# Patient Record
Sex: Female | Born: 1945 | Race: White | Hispanic: No | Marital: Single | Smoking: Never smoker
Health system: Southern US, Community
[De-identification: ages and names within clinical notes are randomized; demographics above are authoritative.]

## PROBLEM LIST (undated history)

## (undated) DIAGNOSIS — R569 Unspecified convulsions: Secondary | ICD-10-CM

## (undated) DIAGNOSIS — K56609 Unspecified intestinal obstruction, unspecified as to partial versus complete obstruction: Secondary | ICD-10-CM

## (undated) DIAGNOSIS — E7800 Pure hypercholesterolemia, unspecified: Secondary | ICD-10-CM

## (undated) DIAGNOSIS — F79 Unspecified intellectual disabilities: Secondary | ICD-10-CM

## (undated) DIAGNOSIS — K6389 Other specified diseases of intestine: Secondary | ICD-10-CM

## (undated) DIAGNOSIS — R609 Edema, unspecified: Secondary | ICD-10-CM

## (undated) HISTORY — PX: CHOLECYSTECTOMY: SHX55

---

## 1994-06-19 ENCOUNTER — Encounter: Payer: Self-pay | Admitting: Internal Medicine

## 1998-04-27 ENCOUNTER — Ambulatory Visit (HOSPITAL_COMMUNITY): Admission: RE | Admit: 1998-04-27 | Discharge: 1998-04-27 | Payer: Self-pay | Admitting: Internal Medicine

## 2000-01-04 ENCOUNTER — Other Ambulatory Visit: Admission: RE | Admit: 2000-01-04 | Discharge: 2000-01-04 | Payer: Self-pay | Admitting: *Deleted

## 2000-01-16 ENCOUNTER — Encounter: Payer: Self-pay | Admitting: *Deleted

## 2000-01-16 ENCOUNTER — Encounter: Admission: RE | Admit: 2000-01-16 | Discharge: 2000-01-16 | Payer: Self-pay | Admitting: *Deleted

## 2000-01-26 ENCOUNTER — Encounter: Payer: Self-pay | Admitting: *Deleted

## 2000-01-26 ENCOUNTER — Encounter: Admission: RE | Admit: 2000-01-26 | Discharge: 2000-01-26 | Payer: Self-pay | Admitting: *Deleted

## 2001-02-21 ENCOUNTER — Encounter: Admission: RE | Admit: 2001-02-21 | Discharge: 2001-02-21 | Payer: Self-pay | Admitting: Obstetrics and Gynecology

## 2001-02-21 ENCOUNTER — Encounter: Payer: Self-pay | Admitting: Obstetrics and Gynecology

## 2001-02-21 ENCOUNTER — Other Ambulatory Visit: Admission: RE | Admit: 2001-02-21 | Discharge: 2001-02-21 | Payer: Self-pay | Admitting: Obstetrics and Gynecology

## 2002-02-05 ENCOUNTER — Other Ambulatory Visit: Admission: RE | Admit: 2002-02-05 | Discharge: 2002-02-05 | Payer: Self-pay | Admitting: Obstetrics and Gynecology

## 2004-10-07 ENCOUNTER — Ambulatory Visit: Payer: Self-pay | Admitting: Family Medicine

## 2004-10-07 ENCOUNTER — Other Ambulatory Visit: Admission: RE | Admit: 2004-10-07 | Discharge: 2004-10-07 | Payer: Self-pay | Admitting: Family Medicine

## 2005-04-19 ENCOUNTER — Ambulatory Visit: Payer: Self-pay | Admitting: Family Medicine

## 2006-09-24 ENCOUNTER — Encounter (INDEPENDENT_AMBULATORY_CARE_PROVIDER_SITE_OTHER): Payer: Self-pay | Admitting: *Deleted

## 2006-10-03 ENCOUNTER — Ambulatory Visit: Payer: Self-pay | Admitting: Family Medicine

## 2006-10-16 ENCOUNTER — Encounter: Admission: RE | Admit: 2006-10-16 | Discharge: 2006-10-16 | Payer: Self-pay | Admitting: Specialist

## 2007-02-21 DIAGNOSIS — N393 Stress incontinence (female) (male): Secondary | ICD-10-CM | POA: Insufficient documentation

## 2007-02-21 DIAGNOSIS — R569 Unspecified convulsions: Secondary | ICD-10-CM

## 2007-02-21 DIAGNOSIS — F79 Unspecified intellectual disabilities: Secondary | ICD-10-CM | POA: Insufficient documentation

## 2007-02-22 ENCOUNTER — Encounter (INDEPENDENT_AMBULATORY_CARE_PROVIDER_SITE_OTHER): Payer: Self-pay | Admitting: *Deleted

## 2007-06-15 ENCOUNTER — Emergency Department (HOSPITAL_COMMUNITY): Admission: EM | Admit: 2007-06-15 | Discharge: 2007-06-15 | Payer: Self-pay | Admitting: Emergency Medicine

## 2007-10-31 ENCOUNTER — Encounter: Admission: RE | Admit: 2007-10-31 | Discharge: 2007-10-31 | Payer: Self-pay | Admitting: Specialist

## 2008-09-10 ENCOUNTER — Emergency Department (HOSPITAL_COMMUNITY): Admission: EM | Admit: 2008-09-10 | Discharge: 2008-09-10 | Payer: Self-pay | Admitting: Emergency Medicine

## 2008-11-17 ENCOUNTER — Encounter: Admission: RE | Admit: 2008-11-17 | Discharge: 2008-11-17 | Payer: Self-pay | Admitting: Specialist

## 2009-05-09 ENCOUNTER — Encounter: Payer: Self-pay | Admitting: Internal Medicine

## 2010-07-26 ENCOUNTER — Ambulatory Visit (HOSPITAL_BASED_OUTPATIENT_CLINIC_OR_DEPARTMENT_OTHER): Admission: RE | Admit: 2010-07-26 | Discharge: 2010-07-26 | Payer: Self-pay | Admitting: Specialist

## 2010-07-30 ENCOUNTER — Ambulatory Visit: Payer: Self-pay | Admitting: Internal Medicine

## 2010-10-31 ENCOUNTER — Ambulatory Visit (HOSPITAL_BASED_OUTPATIENT_CLINIC_OR_DEPARTMENT_OTHER)
Admission: RE | Admit: 2010-10-31 | Discharge: 2010-10-31 | Payer: Self-pay | Source: Home / Self Care | Admitting: Specialist

## 2010-11-05 ENCOUNTER — Ambulatory Visit: Payer: Self-pay | Admitting: Internal Medicine

## 2011-01-14 ENCOUNTER — Encounter: Payer: Self-pay | Admitting: Specialist

## 2011-07-19 ENCOUNTER — Emergency Department (HOSPITAL_BASED_OUTPATIENT_CLINIC_OR_DEPARTMENT_OTHER)
Admission: EM | Admit: 2011-07-19 | Discharge: 2011-07-19 | Disposition: A | Discharge status: Home / Self Care | Payer: Medicare Other | Attending: Emergency Medicine | Admitting: Emergency Medicine

## 2011-07-19 DIAGNOSIS — R569 Unspecified convulsions: Secondary | ICD-10-CM | POA: Insufficient documentation

## 2011-07-19 DIAGNOSIS — S61219A Laceration without foreign body of unspecified finger without damage to nail, initial encounter: Secondary | ICD-10-CM

## 2011-07-19 DIAGNOSIS — W268XXA Contact with other sharp object(s), not elsewhere classified, initial encounter: Secondary | ICD-10-CM | POA: Insufficient documentation

## 2011-07-19 DIAGNOSIS — M810 Age-related osteoporosis without current pathological fracture: Secondary | ICD-10-CM | POA: Insufficient documentation

## 2011-07-19 DIAGNOSIS — S61209A Unspecified open wound of unspecified finger without damage to nail, initial encounter: Secondary | ICD-10-CM | POA: Insufficient documentation

## 2011-07-19 DIAGNOSIS — E7800 Pure hypercholesterolemia, unspecified: Secondary | ICD-10-CM | POA: Insufficient documentation

## 2011-07-19 HISTORY — DX: Edema, unspecified: R60.9

## 2011-07-19 HISTORY — DX: Unspecified convulsions: R56.9

## 2011-07-19 HISTORY — DX: Pure hypercholesterolemia, unspecified: E78.00

## 2011-07-19 MED ORDER — TETANUS-DIPHTH-ACELL PERTUSSIS 5-2.5-18.5 LF-MCG/0.5 IM SUSP
INTRAMUSCULAR | Status: AC
  Filled 2011-07-19: qty 0.5

## 2011-07-19 MED ORDER — TETANUS-DIPHTH-ACELL PERTUSSIS 5-2.5-18.5 LF-MCG/0.5 IM SUSP
0.5000 mL | Freq: Once | INTRAMUSCULAR | Status: AC
  Administered 2011-07-19: 0.5 mL via INTRAMUSCULAR

## 2011-07-19 NOTE — ED Notes (Signed)
Right index finger laceration while opening a mason jar lid last night

## 2011-07-19 NOTE — ED Provider Notes (Signed)
History     Chief Complaint  Patient presents with  . Laceration   HPI Comments: Pt states that she was opening a mason jar and cut herself on the metal  Patient is a 65 y.o. female presenting with skin laceration. The history is provided by a caregiver and the patient. History Limited By: pt is mr and gave a short history,primary history provided by caregiver. No language interpreter was used.  Laceration  The incident occurred less than 1 hour ago. The laceration is located on the right hand. The laceration is 1 cm in size. The laceration mechanism was a a metal edge. The patient is experiencing no pain. She reports no foreign bodies present. Her tetanus status is out of date.    Past Medical History  Diagnosis Date  . Hypercholesteremia   . Constipation   . Menopause   . Osteoporosis   . Seizure   . Edema     History reviewed. No pertinent past surgical history.  History reviewed. No pertinent family history.  History  Substance Use Topics  . Smoking status: Never Smoker   . Smokeless tobacco: Not on file  . Alcohol Use: No    OB History    Grav Para Term Preterm Abortions TAB SAB Ect Mult Living                  Review of Systems  Constitutional: Negative.   Respiratory: Negative.   Cardiovascular: Negative.   Skin:       Pt has a superficial laceration to the right index finger  Neurological: Negative.     Physical Exam  BP 143/68  Pulse 75  Temp(Src) 98.1 F (36.7 C) (Oral)  Resp 20  SpO2 100%  Physical Exam  Nursing note and vitals reviewed. Constitutional: She appears well-developed and well-nourished.  Cardiovascular: Normal rate.   Pulmonary/Chest: Effort normal.  Musculoskeletal: Normal range of motion.  Skin: Laceration noted.       ED Course  Procedures  MDM No concern for fb, dermabond used to close the area,tetanus updated     Teressa Lower, NP 07/19/11 1235

## 2011-07-19 NOTE — ED Provider Notes (Signed)
Medical screening examination/treatment/procedure(s) were performed by non-physician practitioner and as supervising physician I was immediately available for consultation/collaboration.   Celene Kras, MD 07/19/11 [phone]

## 2011-07-19 NOTE — ED Notes (Signed)
Right index finger soaked in normal saline/betadine

## 2011-09-25 LAB — COMPREHENSIVE METABOLIC PANEL
ALT: 17
Alkaline Phosphatase: 80
BUN: 18
CO2: 29
Calcium: 9.2
GFR calc non Af Amer: >60
Glucose, Bld: 124 — ABNORMAL HIGH
Potassium: 4.9
Sodium: 141

## 2011-09-25 LAB — LIPASE, BLOOD: Lipase: 23

## 2011-09-25 LAB — CBC
HCT: 36.9
Hemoglobin: 12.4
MCHC: 33.5
RBC: 3.98

## 2011-09-25 LAB — URINE MICROSCOPIC-ADD ON

## 2011-09-25 LAB — URINALYSIS, ROUTINE W REFLEX MICROSCOPIC
Bilirubin Urine: NEGATIVE
Glucose, UA: NEGATIVE
Hgb urine dipstick: NEGATIVE
Ketones, ur: NEGATIVE
Protein, ur: NEGATIVE

## 2011-09-25 LAB — POCT CARDIAC MARKERS: Myoglobin, poc: 83

## 2011-09-25 LAB — DIFFERENTIAL
Basophils Absolute: 0.1
Basophils Relative: 1
Eosinophils Absolute: 0
Neutro Abs: 9.1 — ABNORMAL HIGH
Neutrophils Relative %: 81 — ABNORMAL HIGH

## 2011-11-29 ENCOUNTER — Emergency Department (HOSPITAL_BASED_OUTPATIENT_CLINIC_OR_DEPARTMENT_OTHER)
Admission: EM | Admit: 2011-11-29 | Discharge: 2011-11-29 | Disposition: A | Discharge status: Home / Self Care | Payer: Medicare Other | Attending: Emergency Medicine | Admitting: Emergency Medicine

## 2011-11-29 ENCOUNTER — Encounter (HOSPITAL_BASED_OUTPATIENT_CLINIC_OR_DEPARTMENT_OTHER): Payer: Self-pay | Admitting: Emergency Medicine

## 2011-11-29 DIAGNOSIS — W1809XA Striking against other object with subsequent fall, initial encounter: Secondary | ICD-10-CM | POA: Insufficient documentation

## 2011-11-29 DIAGNOSIS — Z0389 Encounter for observation for other suspected diseases and conditions ruled out: Secondary | ICD-10-CM | POA: Insufficient documentation

## 2011-11-29 DIAGNOSIS — M810 Age-related osteoporosis without current pathological fracture: Secondary | ICD-10-CM | POA: Insufficient documentation

## 2011-11-29 DIAGNOSIS — Z79899 Other long term (current) drug therapy: Secondary | ICD-10-CM | POA: Insufficient documentation

## 2011-11-29 DIAGNOSIS — W1800XA Striking against unspecified object with subsequent fall, initial encounter: Secondary | ICD-10-CM

## 2011-11-29 DIAGNOSIS — E7800 Pure hypercholesterolemia, unspecified: Secondary | ICD-10-CM | POA: Insufficient documentation

## 2011-11-29 DIAGNOSIS — Y9210 Unspecified residential institution as the place of occurrence of the external cause: Secondary | ICD-10-CM | POA: Insufficient documentation

## 2011-11-29 NOTE — ED Notes (Signed)
No distress noted in Pt. °

## 2011-11-29 NOTE — ED Provider Notes (Signed)
History     CSN: [id number] Arrival date & time: 11/29/2011  1:17 PM   None     Chief Complaint  Patient presents with  . Fall    hit front tooth on a chair    (Consider location/radiation/quality/duration/timing/severity/associated sxs/prior treatment) HPI  65 year old woman with history of seizures, but no seizure in over 8 years per group home staff, who lives in a group home comes in for a fall.  Most of the history is obtained from the [facility] group home staff member present.   Past Medical History  Diagnosis Date  . Hypercholesteremia   . Constipation   . Menopause   . Osteoporosis   . Seizure   . Edema     No past surgical history on file.  No family history on file.  History  Substance Use Topics  . Smoking status: Never Smoker   . Smokeless tobacco: Not on file  . Alcohol Use: No     Review of Systems No CP, SOB, nausea, vomiting, diarrhea, constipation, or leg pain.  See HPI for rest.  Allergies  Review of patient's allergies indicates no known allergies.  Home Medications   Current Outpatient Rx  Name Route Sig Dispense Refill  . ATORVASTATIN CALCIUM 10 MG PO TABS Oral Take 10 mg by mouth daily.      Marland Kitchen BISACODYL 5 MG PO TBEC Oral Take 5 mg by mouth daily as needed.      Marland Kitchen CALCIUM CARBONATE-VITAMIN D 500-200 MG-UNIT PO TABS Oral Take 1 tablet by mouth 3 (three) times daily.      . CHLORHEXIDINE GLUCONATE 0.12 % MT SOLN Mouth/Throat Use as directed 15 mLs in the mouth or throat 2 (two) times daily.      . FELBAMATE 600 MG PO TABS Oral Take 600 mg by mouth 3 (three) times daily.      . FENOFIBRATE 145 MG PO TABS Oral Take 145 mg by mouth daily.      . FUROSEMIDE 20 MG PO TABS Oral Take 20 mg by mouth daily.      Marland Kitchen LACTULOSE 10 G PO PACK Oral Take 10 g by mouth 2 (two) times daily.      Marland Kitchen LORAZEPAM 1 MG PO TABS Oral Take 1 mg by mouth every 8 (eight) hours.      . OXYBUTYNIN CHLORIDE 5 MG PO TABS Oral Take 5 mg by mouth 3 (three) times daily.        Marland Kitchen POTASSIUM CHLORIDE CRYS CR 20 MEQ PO TBCR Oral Take 20 mEq by mouth daily.        BP 151/85  Pulse 88  Temp(Src) 98.6 F (37 C) (Oral)  Resp 17  Ht 5\' 3"  (1.6 m)  Wt 211 lb (95.709 kg)  BMI 37.38 kg/m2  SpO2 95%  Physical Exam General: alert, well-developed, and cooperative to examination.  Head: normocephalic and atraumatic.   Mouth: Small scantly bleeding cut on external L lower lip.  ~72mm cut on inside lower lip not draining blood.  Top L front tooth is loose and painful, no bleeding.pharynx pink and moist, no erythema, and no exudates. No significant swelling.  Lungs: normal respiratory effort, no accessory muscle use, normal breath sounds, no crackles, and no wheezes. Heart: normal rate, regular rhythm, no murmur, no gallop, and no rub.  Extremities: No cyanosis, clubbing.  No warmth or pain to palpation in legs. Neurologic: alert & oriented X3, cranial nerves II-XII intact, strength 5/5 and symmetric in arms,  4+/5 and symmetric in legs (per group home staff member patient does not ambulate much).   ED Course  Procedures (including critical care time)    Diagnosis: Mechanical fall against object   MDM  I reviewed all aspects of this case with my attending, who personally interviewed and examined this patient.  Patient can be discharged back to her group home with the following plan:  Soft diet for three days Cold compresses 3x per day for two days Tylenol or ibuprofen OTC for pain control If tooth is still painful/loose after three days, group home to call patient's dentist for an appt.        Blanca Friend, MD 11/29/11 [phone]

## 2011-11-29 NOTE — ED Notes (Signed)
Pt. Fell into a chair this day causing injury to the Lower L lip with bleeding controlled and to the L front top tooth.  Some controlled bleeding noted to the Front Left top tooth.  Pt. Reports the pain is in the Lip and it "just hurts".

## 2011-12-14 ENCOUNTER — Encounter (HOSPITAL_BASED_OUTPATIENT_CLINIC_OR_DEPARTMENT_OTHER): Payer: Self-pay | Admitting: *Deleted

## 2011-12-14 DIAGNOSIS — M810 Age-related osteoporosis without current pathological fracture: Secondary | ICD-10-CM | POA: Insufficient documentation

## 2011-12-14 DIAGNOSIS — E7800 Pure hypercholesterolemia, unspecified: Secondary | ICD-10-CM | POA: Insufficient documentation

## 2011-12-14 DIAGNOSIS — Z79899 Other long term (current) drug therapy: Secondary | ICD-10-CM | POA: Insufficient documentation

## 2011-12-14 DIAGNOSIS — Z043 Encounter for examination and observation following other accident: Secondary | ICD-10-CM | POA: Insufficient documentation
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
## 2011-12-15 ENCOUNTER — Emergency Department (HOSPITAL_BASED_OUTPATIENT_CLINIC_OR_DEPARTMENT_OTHER)
Admission: EM | Admit: 2011-12-15 | Discharge: 2011-12-15 | Disposition: A | Discharge status: Home / Self Care | Payer: Medicare Other | Attending: Emergency Medicine | Admitting: Emergency Medicine

## 2011-12-15 NOTE — ED Notes (Signed)
See triage note.
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
## 2012-04-12 ENCOUNTER — Other Ambulatory Visit: Payer: Self-pay | Admitting: Specialist

## 2012-04-12 DIAGNOSIS — Z1231 Encounter for screening mammogram for malignant neoplasm of breast: Secondary | ICD-10-CM

## 2012-05-03 ENCOUNTER — Ambulatory Visit: Payer: Medicare Other

## 2013-12-10 ENCOUNTER — Emergency Department (HOSPITAL_COMMUNITY): Payer: Medicare Other

## 2013-12-10 ENCOUNTER — Inpatient Hospital Stay (HOSPITAL_COMMUNITY): Payer: Medicare Other

## 2013-12-10 ENCOUNTER — Inpatient Hospital Stay (HOSPITAL_COMMUNITY)
Admission: EM | Admit: 2013-12-10 | Discharge: 2013-12-15 | DRG: 389 | Disposition: A | Discharge status: Home / Self Care | Payer: Medicare Other | Attending: General Surgery | Admitting: General Surgery

## 2013-12-10 ENCOUNTER — Encounter (HOSPITAL_COMMUNITY): Payer: Self-pay | Admitting: Emergency Medicine

## 2013-12-10 DIAGNOSIS — J9819 Other pulmonary collapse: Secondary | ICD-10-CM | POA: Diagnosis present

## 2013-12-10 DIAGNOSIS — Z79899 Other long term (current) drug therapy: Secondary | ICD-10-CM

## 2013-12-10 DIAGNOSIS — Z781 Physical restraint status: Secondary | ICD-10-CM | POA: Diagnosis present

## 2013-12-10 DIAGNOSIS — E785 Hyperlipidemia, unspecified: Secondary | ICD-10-CM | POA: Diagnosis present

## 2013-12-10 DIAGNOSIS — R569 Unspecified convulsions: Secondary | ICD-10-CM | POA: Diagnosis present

## 2013-12-10 DIAGNOSIS — K56609 Unspecified intestinal obstruction, unspecified as to partial versus complete obstruction: Secondary | ICD-10-CM

## 2013-12-10 DIAGNOSIS — R7309 Other abnormal glucose: Secondary | ICD-10-CM | POA: Diagnosis present

## 2013-12-10 DIAGNOSIS — E7800 Pure hypercholesterolemia, unspecified: Secondary | ICD-10-CM | POA: Diagnosis present

## 2013-12-10 DIAGNOSIS — G40909 Epilepsy, unspecified, not intractable, without status epilepticus: Secondary | ICD-10-CM | POA: Diagnosis present

## 2013-12-10 DIAGNOSIS — F79 Unspecified intellectual disabilities: Secondary | ICD-10-CM | POA: Diagnosis present

## 2013-12-10 DIAGNOSIS — D72829 Elevated white blood cell count, unspecified: Secondary | ICD-10-CM | POA: Diagnosis present

## 2013-12-10 DIAGNOSIS — R030 Elevated blood-pressure reading, without diagnosis of hypertension: Secondary | ICD-10-CM | POA: Diagnosis present

## 2013-12-10 DIAGNOSIS — R739 Hyperglycemia, unspecified: Secondary | ICD-10-CM

## 2013-12-10 DIAGNOSIS — G4733 Obstructive sleep apnea (adult) (pediatric): Secondary | ICD-10-CM | POA: Diagnosis present

## 2013-12-10 DIAGNOSIS — M810 Age-related osteoporosis without current pathological fracture: Secondary | ICD-10-CM | POA: Diagnosis present

## 2013-12-10 LAB — CBC WITH DIFFERENTIAL/PLATELET
Eosinophils Relative: 0 % (ref 0–5)
HCT: 40.1 % (ref 36.0–46.0)
Hemoglobin: 13.8 g/dL (ref 12.0–15.0)
Lymphocytes Relative: 11 % — ABNORMAL LOW (ref 12–46)
Lymphs Abs: 1.2 10*3/uL (ref 0.7–4.0)
MCV: 90.9 fL (ref 78.0–100.0)
Platelets: 315 10*3/uL (ref 150–400)
RBC: 4.41 MIL/uL (ref 3.87–5.11)
WBC: 11.1 10*3/uL — ABNORMAL HIGH (ref 4.0–10.5)

## 2013-12-10 LAB — URINALYSIS, ROUTINE W REFLEX MICROSCOPIC
Hgb urine dipstick: NEGATIVE
Ketones, ur: NEGATIVE mg/dL
Protein, ur: NEGATIVE mg/dL
Urobilinogen, UA: 1 mg/dL (ref 0.0–1.0)

## 2013-12-10 LAB — COMPREHENSIVE METABOLIC PANEL
ALT: 15 U/L (ref 0–35)
Alkaline Phosphatase: 52 U/L (ref 39–117)
CO2: 24 mEq/L (ref 19–32)
Calcium: 9.4 mg/dL (ref 8.4–10.5)
GFR calc Af Amer: >90 mL/min (ref >90)
GFR calc non Af Amer: 88 mL/min — ABNORMAL LOW (ref >90)
Glucose, Bld: 169 mg/dL — ABNORMAL HIGH (ref 70–99)
Sodium: 139 mEq/L (ref 135–145)

## 2013-12-10 LAB — URINE MICROSCOPIC-ADD ON

## 2013-12-10 LAB — CG4 I-STAT (LACTIC ACID): Lactic Acid, Venous: 1.11 mmol/L (ref 0.5–2.2)

## 2013-12-10 MED ORDER — ONDANSETRON HCL 4 MG/2ML IJ SOLN
4.0000 mg | Freq: Once | INTRAMUSCULAR | Status: AC
  Administered 2013-12-10: 4 mg via INTRAVENOUS
  Filled 2013-12-10: qty 2

## 2013-12-10 MED ORDER — IOHEXOL 300 MG/ML  SOLN
100.0000 mL | Freq: Once | INTRAMUSCULAR | Status: AC | PRN
  Administered 2013-12-10: 100 mL via INTRAVENOUS

## 2013-12-10 MED ORDER — MORPHINE SULFATE 4 MG/ML IJ SOLN
4.0000 mg | Freq: Once | INTRAMUSCULAR | Status: AC
  Administered 2013-12-10: 4 mg via INTRAVENOUS
  Filled 2013-12-10: qty 1

## 2013-12-10 MED ORDER — SODIUM CHLORIDE 0.9 % IV BOLUS (SEPSIS)
1000.0000 mL | Freq: Once | INTRAVENOUS | Status: AC
  Administered 2013-12-10: 1000 mL via INTRAVENOUS

## 2013-12-10 MED ORDER — IOHEXOL 300 MG/ML  SOLN
50.0000 mL | Freq: Once | INTRAMUSCULAR | Status: AC | PRN
  Administered 2013-12-10: 50 mL via ORAL

## 2013-12-10 NOTE — ED Provider Notes (Signed)
CSN: [id number]     Arrival date & time 12/10/13  1755 History   First MD Initiated Contact with Patient 12/10/13 1815     Chief Complaint  Patient presents with  . Nausea  . Emesis  . Abdominal Pain   (Consider location/radiation/quality/duration/timing/severity/associated sxs/prior Treatment) HPI Comments: 67 year old female presents from her group home with 2 days of vomiting and nausea. She's also been having some epigastric and left quadrant abdominal pain. There's been no diarrhea. She states she had a normal bowel movement this morning. No fevers or chills. No urinary symptoms. Most of the history taken from the caregiver. She states that the patient is unable to take her pills at the group home until her concern about a bowel obstruction and sent her to the ER. Patient has never had any known abdominal surgeries.   Past Medical History  Diagnosis Date  . Hypercholesteremia   . Constipation   . Menopause   . Osteoporosis   . Seizure   . Edema    No past surgical history on file. No family history on file. History  Substance Use Topics  . Smoking status: Never Smoker   . Smokeless tobacco: Not on file  . Alcohol Use: No   OB History   Grav Para Term Preterm Abortions TAB SAB Ect Mult Living                 Review of Systems  Constitutional: Negative for fever and chills.  Gastrointestinal: Positive for nausea, vomiting and abdominal pain. Negative for diarrhea and constipation.  Genitourinary: Negative for dysuria.  All other systems reviewed and are negative.    Allergies  Review of patient's allergies indicates no known allergies.  Home Medications   Current Outpatient Rx  Name  Route  Sig  Dispense  Refill  . atorvastatin (LIPITOR) 10 MG tablet   Oral   Take 10 mg by mouth daily.           . bisacodyl (DULCOLAX) 5 MG EC tablet   Oral   Take 5 mg by mouth daily as needed.           . calcium-vitamin D (OSCAL WITH D) 500-200 MG-UNIT per tablet    Oral   Take 1 tablet by mouth 3 (three) times daily.           . chlorhexidine (PERIDEX) 0.12 % solution   Mouth/Throat   Use as directed 15 mLs in the mouth or throat 2 (two) times daily.           . felbamate (FELBATOL) 600 MG tablet   Oral   Take 600 mg by mouth 3 (three) times daily.           . fenofibrate (TRICOR) 145 MG tablet   Oral   Take 145 mg by mouth daily.           . furosemide (LASIX) 20 MG tablet   Oral   Take 20 mg by mouth daily.           Marland Kitchen lactulose (CEPHULAC) 10 G packet   Oral   Take 10 g by mouth 2 (two) times daily.           Marland Kitchen LORazepam (ATIVAN) 1 MG tablet   Oral   Take 1 mg by mouth every 8 (eight) hours.           Marland Kitchen oxybutynin (DITROPAN) 5 MG tablet   Oral   Take 5 mg by mouth 3 (  three) times daily.           . potassium chloride SA (K-DUR,KLOR-CON) 20 MEQ tablet   Oral   Take 20 mEq by mouth daily.           . urea (X-VIATE) 40 % CREA   Topical   Apply 1 application topically 2 (two) times daily.            BP 139/83  Pulse 129  Temp(Src) 99.7 F (37.6 C) (Oral)  Resp 16  SpO2 100% Physical Exam  Nursing note and vitals reviewed. Constitutional: She is oriented to person, place, and time. She appears well-developed and well-nourished.  HENT:  Head: Normocephalic and atraumatic.  Right Ear: External ear normal.  Left Ear: External ear normal.  Nose: Nose normal.  Eyes: Right eye exhibits no discharge. Left eye exhibits no discharge.  Cardiovascular: Normal rate, regular rhythm and normal heart sounds.   Pulmonary/Chest: Effort normal and breath sounds normal.  Abdominal: Soft. She exhibits no distension. There is tenderness in the epigastric area, periumbilical area and left upper quadrant.  Neurological: She is alert and oriented to person, place, and time.  Skin: Skin is warm and dry.    ED Course  Procedures (including critical care time) Labs Review Labs Reviewed  CBC WITH DIFFERENTIAL - Abnormal;  Notable for the following:    WBC 11.1 (*)    Neutrophils Relative % 83 (*)    Neutro Abs 9.2 (*)    Lymphocytes Relative 11 (*)    All other components within normal limits  COMPREHENSIVE METABOLIC PANEL - Abnormal; Notable for the following:    Glucose, Bld 169 (*)    Albumin 3.4 (*)    GFR calc non Af Amer 88 (*)    All other components within normal limits  URINALYSIS, ROUTINE W REFLEX MICROSCOPIC - Abnormal; Notable for the following:    Color, Urine AMBER (*)    APPearance TURBID (*)    Specific Gravity, Urine 1.035 (*)    Bilirubin Urine SMALL (*)    Leukocytes, UA SMALL (*)    All other components within normal limits  URINE MICROSCOPIC-ADD ON - Abnormal; Notable for the following:    Squamous Epithelial / LPF FEW (*)    Bacteria, UA FEW (*)    Crystals CA OXALATE CRYSTALS (*)    All other components within normal limits  LIPASE, BLOOD  CG4 I-STAT (LACTIC ACID)   Imaging Review Ct Abdomen Pelvis W Contrast  12/10/2013   CLINICAL DATA:  Abdominal pain, vomiting, evaluate for small bowel obstruction.  EXAM: CT ABDOMEN AND PELVIS WITH CONTRAST  TECHNIQUE: Multidetector CT imaging of the abdomen and pelvis was performed using the standard protocol following bolus administration of intravenous contrast.  CONTRAST:  OMNIPAQUE IOHEXOL 300 MG/ML SOLN, 50mL OMNIPAQUE IOHEXOL 300 MG/ML SOLN  COMPARISON:  None.  FINDINGS: Sagittal images of the spine shows extensive degenerative changes lumbar spine.  Small amount of perihepatic ascites. There is mild intra and extrahepatic biliary ductal dilatation. CBD measures 1.2 cm in diameter. Patient is status postcholecystectomy. Mild fatty replaced pancreas. The spleen and adrenal glands are unremarkable. Kidneys are symmetrical in size and enhancement. No hydronephrosis or hydroureter.  Delayed renal images shows bilateral renal symmetrical excretion. No aortic aneurysm. There are significant distended small bowel loops with multiple  air-fluid levels. Postsurgical changes are noted anterior abdominal wall. Small amount of free fluid is noted in right paracolic gutter. The left colon and distal colon is decompressed  small amount of pelvic free fluid is noted.  In axial image 65 there is transition point in caliber of distal small bowel. Findings are consistent with small bowel obstruction probable due to adhesions. The terminal ileum is small caliber decompressed. No any oral contrast material is noted in distal small bowel. Moderate stool is noted in transverse colon. Mild gastric distention with fluid and debris probable mild ileus without evidence of gastric outlet obstruction.  The uterus is atrophic. Urinary bladder is unremarkable. No destructive bony lesions are noted within pelvis.  IMPRESSION: 1. Findings consistent with distal small-bowel obstruction probable due to adhesions. 2. Small amount of perihepatic fluid. Small amount of free fluid in right paracolic gutter and within posterior pelvis. 3. Degenerative changes lumbar spine. 4. Mild intrahepatic and extrahepatic biliary ductal dilatation. 5. Probable gastric ileus with gastric distension without evidence of gastric outlet obstruction.   Electronically Signed   By: Natasha Mead M.D.   On: 12/10/2013 19:57    EKG Interpretation   None       MDM   1. Small bowel obstruction    Patient with SBO. Fluid resuscitated and given anti-emetics and pain control. No peritonitis. D/w surgery, who will admit and put in NG and treat conservatively. Requesting hospitalist consult for medical management of her comorbidities while inpatient, Dr. Toniann Fail will see.     Audree Camel, MD 12/11/13 [phone]

## 2013-12-10 NOTE — ED Notes (Signed)
Patient back from CT Patient remains non-verbal, which is her baseline with strangers (per caregiver who is at the bedside) Patient appears in NAD Side rails up, call bell in reach

## 2013-12-10 NOTE — H&P (Signed)
[name] is an 67 y.o. female.   Chief Complaint: vomiting HPI: The pt is a 67 yo wf who lives in a group home secondary to some element of mental retardation. She was in her usual state of health until Monday when she developed nausea and vomiting. She had none on Tuesday and then started again today. She denies abdominal pain. She denies previous surgery but she does not have a gallbladder and she has a RUQ transverse scar.  Past Medical History  Diagnosis Date  . Hypercholesteremia   . Constipation   . Menopause   . Osteoporosis   . Seizure   . Edema     No past surgical history on file.  No family history on file. Social History:  reports that she has never smoked. She does not have any smokeless tobacco history on file. She reports that she does not drink alcohol or use illicit drugs.  Allergies: No Known Allergies   (Not in a hospital admission)  Results for orders placed during the hospital encounter of 12/10/13 (from the past 48 hour(s))  CBC WITH DIFFERENTIAL     Status: Abnormal   Collection Time    12/10/13  6:35 PM      Result Value Range   WBC 11.1 (*) 4.0 - 10.5 K/uL   RBC 4.41  3.87 - 5.11 MIL/uL   Hemoglobin 13.8  12.0 - 15.0 g/dL   HCT 16.1  09.6 - 04.5 %   MCV 90.9  78.0 - 100.0 fL   MCH 31.3  26.0 - 34.0 pg   MCHC 34.4  30.0 - 36.0 g/dL   RDW 40.9  81.1 - 91.4 %   Platelets 315  150 - 400 K/uL   Neutrophils Relative % 83 (*) 43 - 77 %   Neutro Abs 9.2 (*) 1.7 - 7.7 K/uL   Lymphocytes Relative 11 (*) 12 - 46 %   Lymphs Abs 1.2  0.7 - 4.0 K/uL   Monocytes Relative 6  3 - 12 %   Monocytes Absolute 0.6  0.1 - 1.0 K/uL   Eosinophils Relative 0  0 - 5 %   Eosinophils Absolute 0.0  0.0 - 0.7 K/uL   Basophils Relative 0  0 - 1 %   Basophils Absolute 0.0  0.0 - 0.1 K/uL  COMPREHENSIVE METABOLIC PANEL     Status: Abnormal   Collection Time    12/10/13  6:35 PM      Result Value Range   Sodium 139  135 - 145 mEq/L   Potassium 3.5  3.5 - 5.1 mEq/L    Chloride 105  96 - 112 mEq/L   CO2 24  19 - 32 mEq/L   Glucose, Bld 169 (*) 70 - 99 mg/dL   BUN 15  6 - 23 mg/dL   Creatinine, Ser 7.82  0.50 - 1.10 mg/dL   Calcium 9.4  8.4 - 95.6 mg/dL   Total Protein 7.3  6.0 - 8.3 g/dL   Albumin 3.4 (*) 3.5 - 5.2 g/dL   AST 19  0 - 37 U/L   ALT 15  0 - 35 U/L   Alkaline Phosphatase 52  39 - 117 U/L   Total Bilirubin 0.3  0.3 - 1.2 mg/dL   GFR calc non Af Amer 88 (*) >90 mL/min   GFR calc Af Amer >90  >90 mL/min   Comment: (NOTE)     The eGFR has been calculated using the CKD EPI equation.  This calculation has not been validated in all clinical situations.     eGFR's persistently <90 mL/min signify possible Chronic Kidney     Disease.  LIPASE, BLOOD     Status: None   Collection Time    12/10/13  6:35 PM      Result Value Range   Lipase 12  11 - 59 U/L  URINALYSIS, ROUTINE W REFLEX MICROSCOPIC     Status: Abnormal   Collection Time    12/10/13  6:59 PM      Result Value Range   Color, Urine AMBER (*) YELLOW   Comment: BIOCHEMICALS MAY BE AFFECTED BY COLOR   APPearance TURBID (*) CLEAR   Specific Gravity, Urine 1.035 (*) 1.005 - 1.030   pH 6.0  5.0 - 8.0   Glucose, UA NEGATIVE  NEGATIVE mg/dL   Hgb urine dipstick NEGATIVE  NEGATIVE   Bilirubin Urine SMALL (*) NEGATIVE   Ketones, ur NEGATIVE  NEGATIVE mg/dL   Protein, ur NEGATIVE  NEGATIVE mg/dL   Urobilinogen, UA 1.0  0.0 - 1.0 mg/dL   Nitrite NEGATIVE  NEGATIVE   Leukocytes, UA SMALL (*) NEGATIVE  URINE MICROSCOPIC-ADD ON     Status: Abnormal   Collection Time    12/10/13  6:59 PM      Result Value Range   Squamous Epithelial / LPF FEW (*) RARE   WBC, UA 0-2  <3 WBC/hpf   Bacteria, UA FEW (*) RARE   Crystals CA OXALATE CRYSTALS (*) NEGATIVE   Urine-Other MUCOUS PRESENT     Comment: AMORPHOUS URATES/PHOSPHATES  CG4 I-STAT (LACTIC ACID)     Status: None   Collection Time    12/10/13  7:12 PM      Result Value Range   Lactic Acid, Venous 1.11  0.5 - 2.2 mmol/L   Ct Abdomen  Pelvis W Contrast  12/10/2013   CLINICAL DATA:  Abdominal pain, vomiting, evaluate for small bowel obstruction.  EXAM: CT ABDOMEN AND PELVIS WITH CONTRAST  TECHNIQUE: Multidetector CT imaging of the abdomen and pelvis was performed using the standard protocol following bolus administration of intravenous contrast.  CONTRAST:  OMNIPAQUE IOHEXOL 300 MG/ML SOLN, 50mL OMNIPAQUE IOHEXOL 300 MG/ML SOLN  COMPARISON:  None.  FINDINGS: Sagittal images of the spine shows extensive degenerative changes lumbar spine.  Small amount of perihepatic ascites. There is mild intra and extrahepatic biliary ductal dilatation. CBD measures 1.2 cm in diameter. Patient is status postcholecystectomy. Mild fatty replaced pancreas. The spleen and adrenal glands are unremarkable. Kidneys are symmetrical in size and enhancement. No hydronephrosis or hydroureter.  Delayed renal images shows bilateral renal symmetrical excretion. No aortic aneurysm. There are significant distended small bowel loops with multiple air-fluid levels. Postsurgical changes are noted anterior abdominal wall. Small amount of free fluid is noted in right paracolic gutter. The left colon and distal colon is decompressed small amount of pelvic free fluid is noted.  In axial image 65 there is transition point in caliber of distal small bowel. Findings are consistent with small bowel obstruction probable due to adhesions. The terminal ileum is small caliber decompressed. No any oral contrast material is noted in distal small bowel. Moderate stool is noted in transverse colon. Mild gastric distention with fluid and debris probable mild ileus without evidence of gastric outlet obstruction.  The uterus is atrophic. Urinary bladder is unremarkable. No destructive bony lesions are noted within pelvis.  IMPRESSION: 1. Findings consistent with distal small-bowel obstruction probable due to adhesions. 2. Small amount  of perihepatic fluid. Small amount of free fluid in right  paracolic gutter and within posterior pelvis. 3. Degenerative changes lumbar spine. 4. Mild intrahepatic and extrahepatic biliary ductal dilatation. 5. Probable gastric ileus with gastric distension without evidence of gastric outlet obstruction.   Electronically Signed   By: Natasha Mead M.D.   On: 12/10/2013 19:57    Review of Systems  Constitutional: Negative.   HENT: Negative.   Eyes: Negative.   Respiratory: Negative.   Cardiovascular: Negative.   Gastrointestinal: Positive for nausea and vomiting.  Genitourinary: Negative.   Musculoskeletal: Negative.   Skin: Negative.   Neurological: Negative.   Endo/Heme/Allergies: Negative.   Psychiatric/Behavioral: Negative.     Blood pressure 139/83, pulse 91, temperature 99.7 F (37.6 C), temperature source Oral, resp. rate 16, SpO2 100.00%. Physical Exam  Constitutional: She is oriented to person, place, and time. She appears well-developed and well-nourished.  HENT:  Head: Normocephalic and atraumatic.  Eyes: Conjunctivae and EOM are normal. Pupils are equal, round, and reactive to light.  Neck: Normal range of motion. Neck supple.  Cardiovascular: Normal rate, regular rhythm and normal heart sounds.   Respiratory: Effort normal and breath sounds normal.  GI: Soft. Bowel sounds are normal.  Moderate distension. Nontender. Bowel sounds are present  Musculoskeletal: Normal range of motion.  Neurological: She is alert and oriented to person, place, and time.  Skin: Skin is warm and dry.  Psychiatric:  Pt is mentally retarded and requires lots of prompting to answer questions     Assessment/Plan The pt appears to have an sbo most likely secondary to adhesions. Will admit for bowel rest, iv hydration, and ng tube suctioning. Will repeat abd xrays tomorrow.  TOTH III,Leauna Sharber S 12/10/2013, 9:45 PM

## 2013-12-10 NOTE — ED Notes (Addendum)
Pt c/o centralized abd pain and vomiting x 2 days.  Pt not answering questions.  Ignores when asked.  Pt came with paperwork from dr's office stating that she may have a SBO, ileus.

## 2013-12-10 NOTE — ED Notes (Signed)
Patient taken to CT via stretcher Patient in NAD

## 2013-12-10 NOTE — ED Notes (Signed)
CXR at bedside Patient no longer vomiting and appears in NAD

## 2013-12-10 NOTE — ED Notes (Signed)
Patient nauseated and vomiting clear liquid--Zofran given, see MAR Patient's caregiver made aware of POC Per caregiver, patient's sister in the POA

## 2013-12-10 NOTE — ED Notes (Signed)
Consult at bedside.

## 2013-12-10 NOTE — Progress Notes (Signed)
Called ER for report on patient, nurse transporting another patient at this time

## 2013-12-10 NOTE — Consult Note (Signed)
Reason for Consult: Medical management. Referring Physician: Dr. Carolynne Edouard. Gen. Surgeon.  History obtained from ER physician, notes from general surgery and patient's caregiver and previous records and group home records.  [name] is an 67 y.o. female.  HPI: History of seizures, hyperlipidemia, mental retardation who lives at group home was brought to the ER patient was having abdominal pain with nausea vomiting. As per the discussion with patient and caregiver patient has been having abdominal pain last 2 days which has been gradually worsening. The pain has been mostly generalized over the abdomen. Patient has been having nausea and vomiting at least twice today. In the ER CT abdomen and pelvis shows distal bowel obstruction probably from adhesions. Patient has been admitted for small bowel obstruction and hospitalist has been consulted for medical management. Presently patient is not in acute distress and follows commands. Patient denies any chest pain shortness of breath headache moves all extremities.  Past Medical History  Diagnosis Date  . Hypercholesteremia   . Constipation   . Menopause   . Osteoporosis   . Seizure   . Edema     No past surgical history on file.  No family history on file.  Social History:  reports that she has never smoked. She does not have any smokeless tobacco history on file. She reports that she does not drink alcohol or use illicit drugs.  Allergies: No Known Allergies  Medications: I have reviewed the patient's current medications.  Results for orders placed during the hospital encounter of 12/10/13 (from the past 48 hour(s))  CBC WITH DIFFERENTIAL     Status: Abnormal   Collection Time    12/10/13  6:35 PM      Result Value Range   WBC 11.1 (*) 4.0 - 10.5 K/uL   RBC 4.41  3.87 - 5.11 MIL/uL   Hemoglobin 13.8  12.0 - 15.0 g/dL   HCT 16.1  09.6 - 04.5 %   MCV 90.9  78.0 - 100.0 fL   MCH 31.3  26.0 - 34.0 pg   MCHC 34.4  30.0 - 36.0 g/dL   RDW  40.9  81.1 - 91.4 %   Platelets 315  150 - 400 K/uL   Neutrophils Relative % 83 (*) 43 - 77 %   Neutro Abs 9.2 (*) 1.7 - 7.7 K/uL   Lymphocytes Relative 11 (*) 12 - 46 %   Lymphs Abs 1.2  0.7 - 4.0 K/uL   Monocytes Relative 6  3 - 12 %   Monocytes Absolute 0.6  0.1 - 1.0 K/uL   Eosinophils Relative 0  0 - 5 %   Eosinophils Absolute 0.0  0.0 - 0.7 K/uL   Basophils Relative 0  0 - 1 %   Basophils Absolute 0.0  0.0 - 0.1 K/uL  COMPREHENSIVE METABOLIC PANEL     Status: Abnormal   Collection Time    12/10/13  6:35 PM      Result Value Range   Sodium 139  135 - 145 mEq/L   Potassium 3.5  3.5 - 5.1 mEq/L   Chloride 105  96 - 112 mEq/L   CO2 24  19 - 32 mEq/L   Glucose, Bld 169 (*) 70 - 99 mg/dL   BUN 15  6 - 23 mg/dL   Creatinine, Ser 7.82  0.50 - 1.10 mg/dL   Calcium 9.4  8.4 - 95.6 mg/dL   Total Protein 7.3  6.0 - 8.3 g/dL   Albumin 3.4 (*) 3.5 -  5.2 g/dL   AST 19  0 - 37 U/L   ALT 15  0 - 35 U/L   Alkaline Phosphatase 52  39 - 117 U/L   Total Bilirubin 0.3  0.3 - 1.2 mg/dL   GFR calc non Af Amer 88 (*) >90 mL/min   GFR calc Af Amer >90  >90 mL/min   Comment: (NOTE)     The eGFR has been calculated using the CKD EPI equation.     This calculation has not been validated in all clinical situations.     eGFR's persistently <90 mL/min signify possible Chronic Kidney     Disease.  LIPASE, BLOOD     Status: None   Collection Time    12/10/13  6:35 PM      Result Value Range   Lipase 12  11 - 59 U/L  URINALYSIS, ROUTINE W REFLEX MICROSCOPIC     Status: Abnormal   Collection Time    12/10/13  6:59 PM      Result Value Range   Color, Urine AMBER (*) YELLOW   Comment: BIOCHEMICALS MAY BE AFFECTED BY COLOR   APPearance TURBID (*) CLEAR   Specific Gravity, Urine 1.035 (*) 1.005 - 1.030   pH 6.0  5.0 - 8.0   Glucose, UA NEGATIVE  NEGATIVE mg/dL   Hgb urine dipstick NEGATIVE  NEGATIVE   Bilirubin Urine SMALL (*) NEGATIVE   Ketones, ur NEGATIVE  NEGATIVE mg/dL   Protein, ur  NEGATIVE  NEGATIVE mg/dL   Urobilinogen, UA 1.0  0.0 - 1.0 mg/dL   Nitrite NEGATIVE  NEGATIVE   Leukocytes, UA SMALL (*) NEGATIVE  URINE MICROSCOPIC-ADD ON     Status: Abnormal   Collection Time    12/10/13  6:59 PM      Result Value Range   Squamous Epithelial / LPF FEW (*) RARE   WBC, UA 0-2  <3 WBC/hpf   Bacteria, UA FEW (*) RARE   Crystals CA OXALATE CRYSTALS (*) NEGATIVE   Urine-Other MUCOUS PRESENT     Comment: AMORPHOUS URATES/PHOSPHATES  CG4 I-STAT (LACTIC ACID)     Status: None   Collection Time    12/10/13  7:12 PM      Result Value Range   Lactic Acid, Venous 1.11  0.5 - 2.2 mmol/L    Ct Abdomen Pelvis W Contrast  12/10/2013   CLINICAL DATA:  Abdominal pain, vomiting, evaluate for small bowel obstruction.  EXAM: CT ABDOMEN AND PELVIS WITH CONTRAST  TECHNIQUE: Multidetector CT imaging of the abdomen and pelvis was performed using the standard protocol following bolus administration of intravenous contrast.  CONTRAST:  OMNIPAQUE IOHEXOL 300 MG/ML SOLN, 50mL OMNIPAQUE IOHEXOL 300 MG/ML SOLN  COMPARISON:  None.  FINDINGS: Sagittal images of the spine shows extensive degenerative changes lumbar spine.  Small amount of perihepatic ascites. There is mild intra and extrahepatic biliary ductal dilatation. CBD measures 1.2 cm in diameter. Patient is status postcholecystectomy. Mild fatty replaced pancreas. The spleen and adrenal glands are unremarkable. Kidneys are symmetrical in size and enhancement. No hydronephrosis or hydroureter.  Delayed renal images shows bilateral renal symmetrical excretion. No aortic aneurysm. There are significant distended small bowel loops with multiple air-fluid levels. Postsurgical changes are noted anterior abdominal wall. Small amount of free fluid is noted in right paracolic gutter. The left colon and distal colon is decompressed small amount of pelvic free fluid is noted.  In axial image 65 there is transition point in caliber of distal small bowel.  Findings are consistent with small bowel obstruction probable due to adhesions. The terminal ileum is small caliber decompressed. No any oral contrast material is noted in distal small bowel. Moderate stool is noted in transverse colon. Mild gastric distention with fluid and debris probable mild ileus without evidence of gastric outlet obstruction.  The uterus is atrophic. Urinary bladder is unremarkable. No destructive bony lesions are noted within pelvis.  IMPRESSION: 1. Findings consistent with distal small-bowel obstruction probable due to adhesions. 2. Small amount of perihepatic fluid. Small amount of free fluid in right paracolic gutter and within posterior pelvis. 3. Degenerative changes lumbar spine. 4. Mild intrahepatic and extrahepatic biliary ductal dilatation. 5. Probable gastric ileus with gastric distension without evidence of gastric outlet obstruction.   Electronically Signed   By: Natasha Mead M.D.   On: 12/10/2013 19:57    Review of Systems  Constitutional: Negative.   HENT: Negative.   Eyes: Negative.   Respiratory: Negative.   Cardiovascular: Negative.   Gastrointestinal: Positive for nausea, vomiting and abdominal pain.  Genitourinary: Negative.   Musculoskeletal: Negative.   Skin: Negative.   Neurological: Negative.   Endo/Heme/Allergies: Negative.   Psychiatric/Behavioral: Negative.    Blood pressure 139/83, pulse 91, temperature 99.7 F (37.6 C), temperature source Oral, resp. rate 16, SpO2 100.00%. Physical Exam  Constitutional: She appears well-developed and well-nourished. No distress.  HENT:  Head: Normocephalic and atraumatic.  Right Ear: External ear normal.  Left Ear: External ear normal.  Nose: Nose normal.  Mouth/Throat: Oropharynx is clear and moist. No oropharyngeal exudate.  Eyes: Conjunctivae are normal. Pupils are equal, round, and reactive to light. Right eye exhibits no discharge. Left eye exhibits no discharge. No scleral icterus.  Neck: Normal  range of motion. Neck supple.  Cardiovascular: Normal rate and regular rhythm.   Respiratory: Effort normal and breath sounds normal.  GI: She exhibits distension. There is no tenderness. There is no rebound and no guarding.  Bowel sounds not appreciated.  Musculoskeletal: Normal range of motion.  Neurological: She is alert.  Oriented to name. Follows commands and move all extremities.  Skin: Skin is warm and dry. She is not diaphoretic. No erythema.    Assessment/Plan: #1. Small bowel obstruction - appreciate surgery orders and notes. Further recommendations per surgery. #2. History of seizures - patient is on felbamate. I have discussed with on-call neurologist Dr. Cyril Mourning. Since patient is n.p.o. Dr. Cyril Mourning has advised patient can be placed on IV Keppra 500 mg every 12 hourly until patient can take oral dose of felbamate. I have placed patient on IV Keppra. #3. Leukocytosis - probably reactionary to current situation. Patient mildly febrile. I have ordered chest x-ray to make sure patient has not aspirated. #4. Mild hyperglycemia - I have placed patient on CBG checks every 4 hourly. Check hemoglobin A1c. #5. History of hyperlipidemia - continue statins when patient can take orally.  I have reviewed patient's old charts group home records and have discussed with on-call neurologist and discuss with patient's caregiver and tried to reach patient's sister over the phone but was unable to.   Thanks for involving [place] in patient's care we'll follow along with you.  Eduard Clos. 12/10/2013, 10:15 PM

## 2013-12-11 ENCOUNTER — Inpatient Hospital Stay (HOSPITAL_COMMUNITY): Payer: Medicare Other

## 2013-12-11 ENCOUNTER — Encounter (HOSPITAL_COMMUNITY): Payer: Self-pay

## 2013-12-11 DIAGNOSIS — G4733 Obstructive sleep apnea (adult) (pediatric): Secondary | ICD-10-CM

## 2013-12-11 LAB — CBC
HCT: 37.1 % (ref 36.0–46.0)
Hemoglobin: 12.3 g/dL (ref 12.0–15.0)
MCHC: 33.2 g/dL (ref 30.0–36.0)
MCV: 92.1 fL (ref 78.0–100.0)
RDW: 13.8 % (ref 11.5–15.5)
WBC: 9.4 10*3/uL (ref 4.0–10.5)

## 2013-12-11 LAB — GLUCOSE, CAPILLARY
Glucose-Capillary: 114 mg/dL — ABNORMAL HIGH (ref 70–99)
Glucose-Capillary: 121 mg/dL — ABNORMAL HIGH (ref 70–99)
Glucose-Capillary: 123 mg/dL — ABNORMAL HIGH (ref 70–99)
Glucose-Capillary: 126 mg/dL — ABNORMAL HIGH (ref 70–99)
Glucose-Capillary: 135 mg/dL — ABNORMAL HIGH (ref 70–99)
Glucose-Capillary: 137 mg/dL — ABNORMAL HIGH (ref 70–99)
Glucose-Capillary: 142 mg/dL — ABNORMAL HIGH (ref 70–99)

## 2013-12-11 LAB — BASIC METABOLIC PANEL
BUN: 11 mg/dL (ref 6–23)
CO2: 30 mEq/L (ref 19–32)
Chloride: 102 mEq/L (ref 96–112)
Creatinine, Ser: 0.77 mg/dL (ref 0.50–1.10)
Sodium: 140 mEq/L (ref 135–145)

## 2013-12-11 LAB — HEMOGLOBIN A1C: Hgb A1c MFr Bld: 5.4 % (ref <5.7)

## 2013-12-11 MED ORDER — KCL IN DEXTROSE-NACL 20-5-0.9 MEQ/L-%-% IV SOLN
INTRAVENOUS | Status: DC
  Administered 2013-12-11 – 2013-12-14 (×7): via INTRAVENOUS
  Filled 2013-12-11 (×9): qty 1000

## 2013-12-11 MED ORDER — ONDANSETRON HCL 4 MG/2ML IJ SOLN: 4.0000 mg | Freq: Four times a day (QID) | INTRAMUSCULAR | Status: DC | PRN

## 2013-12-11 MED ORDER — MORPHINE SULFATE 2 MG/ML IJ SOLN
2.0000 mg | INTRAMUSCULAR | Status: DC | PRN
  Administered 2013-12-11 (×3): 2 mg via INTRAVENOUS
  Filled 2013-12-11 (×3): qty 1

## 2013-12-11 MED ORDER — SODIUM CHLORIDE 0.9 % IV SOLN
500.0000 mg | Freq: Two times a day (BID) | INTRAVENOUS | Status: DC
  Administered 2013-12-11 – 2013-12-13 (×7): 500 mg via INTRAVENOUS
  Filled 2013-12-11 (×9): qty 5

## 2013-12-11 MED ORDER — PANTOPRAZOLE SODIUM 40 MG IV SOLR
40.0000 mg | Freq: Every day | INTRAVENOUS | Status: DC
  Administered 2013-12-11 – 2013-12-13 (×4): 40 mg via INTRAVENOUS
  Filled 2013-12-11 (×5): qty 40

## 2013-12-11 NOTE — Evaluation (Signed)
Physical Therapy Evaluation Patient Details Name: [name] MRN: [id number] DOB: 11-25-1946 Today's Date: 12/11/2013 Time: 4098-1191 PT Time Calculation (min): 23 min  PT Assessment / Plan / Recommendation History of Present Illness  67 yo female admitted with SBO. From Group Home per chart  Clinical Impression  Limited eval due to pt not willing to progress activity/ambulate beyond a few steps in room. Pt crying for most of session however she would not respond when therapist questioned why she was crying. Min assist for bed mobility and ambulation in room. Pt reported she was having pain but would only shake her head yes when asked if it was her stomach. Will attempt to follow during stay.     PT Assessment  Patient needs continued PT services    Follow Up Recommendations   (return to group home. Need for f/ therapy to be determined)    Does the patient have the potential to tolerate intense rehabilitation      Barriers to Discharge        Equipment Recommendations  None recommended by PT    Recommendations for Other Services OT consult   Frequency Min 3X/week    Precautions / Restrictions Precautions Precautions: Fall Precaution Comments: NG tube Restrictions Weight Bearing Restrictions: No   Pertinent Vitals/Pain Stomach (possibly)-unrated. RN made aware      Mobility  Bed Mobility Bed Mobility: Supine to Sit Supine to Sit: 4: Min assist;HOB elevated;With rails Details for Bed Mobility Assistance: Increased time and encouragementfor pt to participate. Assist for trunk to full upright although pt may be able to peform unassisted. Assist needed to initiate task.  Transfers Transfers: Sit to Stand;Stand to Sit Sit to Stand: 4: Min assist;From bed Stand to Sit: 4: Min assist;To bed Details for Transfer Assistance: Assist to rise, stabilize, control descent.  Ambulation/Gait Ambulation/Gait Assistance: 4: Min assist Ambulation Distance (Feet): 15  Feet Assistive device: Other (Comment) Ambulation/Gait Assistance Details: Pt held onto IV pole and rails on bed. Pt was walking as if she was hurting (not standing up fully upright) however when questioned pt would not respond-would only cry Gait Pattern: Step-through pattern    Exercises     PT Diagnosis: Difficulty walking;Abnormality of gait;Acute pain  PT Problem List: Decreased mobility;Pain;Decreased balance;Decreased knowledge of use of DME;Decreased cognition PT Treatment Interventions: DME instruction;Gait training;Functional mobility training;Therapeutic activities;Therapeutic exercise;Patient/family education;Balance training     PT Goals(Current goals can be found in the care plan section) Acute Rehab PT Goals Patient Stated Goal: none stated PT Goal Formulation: Patient unable to participate in goal setting Time For Goal Achievement: 12/25/13 Potential to Achieve Goals: Good  Visit Information  Last PT Received On: 12/11/13 Assistance Needed: +1 History of Present Illness: 67 yo female admitted with SBO. From Group Home per chart       Prior Functioning  Home Living Family/patient expects to be discharged to:: Group home Prior Function Comments: unable to get history from pt. Pt would barely answer therapists questions and pt cried for most of session-unsure why Communication Communication: Expressive difficulties;Receptive difficulties (possibly. unsure)    Cognition  Cognition Arousal/Alertness: Awake/alert Behavior During Therapy: Agitated (pt crying most of session-unsure why although pt did state she was hurting) Overall Cognitive Status: No family/caregiver present to determine baseline cognitive functioning    Extremity/Trunk Assessment Upper Extremity Assessment Upper Extremity Assessment: Difficult to assess due to impaired cognition Lower Extremity Assessment Lower Extremity Assessment: Difficult to assess due to impaired cognition Cervical / Trunk  Assessment Cervical /  Trunk Assessment: Normal   Balance    End of Session PT - End of Session Activity Tolerance: Other (comment);Patient limited by pain (Limited by pt crying and unwilling to progress activity) Patient left: in bed;with call bell/phone within reach Nurse Communication: Mobility status;Other (comment) (made RN aware of pt crying, reporting pain)  GP     Rebeca Alert, MPT Pager: [phone]

## 2013-12-11 NOTE — Progress Notes (Signed)
[facility] PROGRESS NOTE  [name]:[id number] DOB: 04/27/1946 DOA: 12/10/2013  PCP: Unknown  Brief HPI: Patient has a history of seizures, hyperlipidemia, mental retardation who lives at group home and was brought to the ER as patient was having abdominal pain with nausea vomiting. In the ER CT abdomen and pelvis showed distal bowel obstruction probably from adhesions. Patient was admitted for small bowel obstruction and hospitalist was consulted for medical management.   Past medical history:  Past Medical History  Diagnosis Date  . Hypercholesteremia   . Constipation   . Menopause   . Osteoporosis   . Seizure   . Edema     Primary Service: Gen Surgery  Procedures: None  Antibiotics: None  Subjective: Patient with mental retardation and so difficult to elicit history from her. Did admit to pain in abdomen after a while. No further nausea or vomiting. No seizure activity.  Objective: Vital Signs  Filed Vitals:   12/10/13 1918 12/10/13 2324 12/11/13 0114 12/11/13 0116  BP:  138/80 137/80   Pulse: 91 80 84   Temp:  98.8 F (37.1 C) 97.5 F (36.4 C)   TempSrc:  Oral Oral   Resp:  20 20   SpO2:  99% 91% 95%    Intake/Output Summary (Last 24 hours) at 12/11/13 9562 Last data filed at 12/11/13 1308  Gross per 24 hour  Intake 1653.33 ml  Output   1150 ml  Net 503.33 ml   General appearance: alert, appears stated age, distracted and moderately obese Head: Normocephalic, without obvious abnormality, atraumatic Nose: NG tube noted Resp: clear to auscultation bilaterally Cardio: regular rate and rhythm, S1, S2 normal, no murmur, click, rub or gallop GI: Slightly distended. Non tender.  No BS. No masses or organomegaly. Extremities: extremities normal, atraumatic, no cyanosis or edema Pulses: 2+ and symmetric Skin: Skin color, texture, turgor normal. No rashes or lesions Neurologic: No focal deficits  Lab Results:  Basic Metabolic Panel:  Recent  Labs Lab 12/10/13 1835 12/11/13 0525  NA 139 140  K 3.5 3.6  CL 105 102  CO2 24 30  GLUCOSE 169* 146*  BUN 15 11  CREATININE 0.66 0.77  CALCIUM 9.4 9.2   Liver Function Tests:  Recent Labs Lab 12/10/13 1835  AST 19  ALT 15  ALKPHOS 52  BILITOT 0.3  PROT 7.3  ALBUMIN 3.4*    Recent Labs Lab 12/10/13 1835  LIPASE 12   No results found for this basename: AMMONIA,  in the last 168 hours CBC:  Recent Labs Lab 12/10/13 1835 12/11/13 0525  WBC 11.1* 9.4  NEUTROABS 9.2*  --   HGB 13.8 12.3  HCT 40.1 37.1  MCV 90.9 92.1  PLT 315 294   CBG:  Recent Labs Lab 12/11/13 0049 12/11/13 0322 12/11/13 0752  GLUCAP 142* 135* 123*    No results found for this or any previous visit (from the past 240 hour(s)).    Studies/Results: Ct Abdomen Pelvis W Contrast  12/10/2013   CLINICAL DATA:  Abdominal pain, vomiting, evaluate for small bowel obstruction.  EXAM: CT ABDOMEN AND PELVIS WITH CONTRAST  TECHNIQUE: Multidetector CT imaging of the abdomen and pelvis was performed using the standard protocol following bolus administration of intravenous contrast.  CONTRAST:  OMNIPAQUE IOHEXOL 300 MG/ML SOLN, 50mL OMNIPAQUE IOHEXOL 300 MG/ML SOLN  COMPARISON:  None.  FINDINGS: Sagittal images of the spine shows extensive degenerative changes lumbar spine.  Small amount of perihepatic ascites. There is mild intra and  extrahepatic biliary ductal dilatation. CBD measures 1.2 cm in diameter. Patient is status postcholecystectomy. Mild fatty replaced pancreas. The spleen and adrenal glands are unremarkable. Kidneys are symmetrical in size and enhancement. No hydronephrosis or hydroureter.  Delayed renal images shows bilateral renal symmetrical excretion. No aortic aneurysm. There are significant distended small bowel loops with multiple air-fluid levels. Postsurgical changes are noted anterior abdominal wall. Small amount of free fluid is noted in right paracolic gutter. The left colon and  distal colon is decompressed small amount of pelvic free fluid is noted.  In axial image 65 there is transition point in caliber of distal small bowel. Findings are consistent with small bowel obstruction probable due to adhesions. The terminal ileum is small caliber decompressed. No any oral contrast material is noted in distal small bowel. Moderate stool is noted in transverse colon. Mild gastric distention with fluid and debris probable mild ileus without evidence of gastric outlet obstruction.  The uterus is atrophic. Urinary bladder is unremarkable. No destructive bony lesions are noted within pelvis.  IMPRESSION: 1. Findings consistent with distal small-bowel obstruction probable due to adhesions. 2. Small amount of perihepatic fluid. Small amount of free fluid in right paracolic gutter and within posterior pelvis. 3. Degenerative changes lumbar spine. 4. Mild intrahepatic and extrahepatic biliary ductal dilatation. 5. Probable gastric ileus with gastric distension without evidence of gastric outlet obstruction.   Electronically Signed   By: Natasha Mead M.D.   On: 12/10/2013 19:57   Dg Chest Port 1 View  12/10/2013   CLINICAL DATA:  Check for infiltrates.  EXAM: PORTABLE CHEST - 1 VIEW  COMPARISON:  None.  FINDINGS: Low volume lungs with streaky opacities and interstitial prominence. No effusion or pneumothorax. Generous heart size without definite cardiopulmonary disease given technique. No acute osseous findings.  IMPRESSION: 1. Bibasilar opacities represent atelectasis when correlated with abdominal CT from earlier the same day. 2. Low lung volumes.   Electronically Signed   By: Tiburcio Pea M.D.   On: 12/10/2013 23:04   Dg Abd Portable 1v  12/11/2013   CLINICAL DATA:  Persistent abdominal distention.  EXAM: PORTABLE ABDOMEN - 1 VIEW  COMPARISON:  CT scan of the abdomen pelvis dated December 10, 2013.  FINDINGS: Supine and upright abdominal films reveal the presence of a nasogastric tube whose  proximal port lies in the region of the body of the stomach with the tip  Approaching the pylorus. There remain loops of moderately distended gas-filled small bowel in the mid and lower abdomen. There is no definite gas within the rectum. Small amounts of stool and gas within the ascending and descending portions of the colon are noted. There are surgical clips in the gallbladder fossa. There is likely atelectasis at the lung bases. There is contrast within the urinary bladder from yesterday's CT scan. The observed portions of the bony structures exhibit no acute abnormalities.  IMPRESSION: 1. Persistent moderate distention of small bowel loops with gas is consistent with a fairly distal small bowel obstruction. 2. No free extraluminal gas collections are demonstrated. 3. The nasogastric tube appears to be in appropriate position. 4. Bibasilar atelectasis is suspected.   Electronically Signed   By: David  [place]   On: 12/11/2013 08:28    Medications:  Scheduled: . levETIRAcetam  500 mg Intravenous Q12H  . pantoprazole (PROTONIX) IV  40 mg Intravenous QHS   Continuous: . dextrose 5 % and 0.9 % NaCl with KCl 20 mEq/L 100 mL/hr at 12/11/13 0122   ZOX:[id number] injection,  ondansetron  Assessment/Plan:  Active Problems:   CONVULSIONS, SEIZURES, NOS   SBO (small bowel obstruction)   Hyperglycemia   HLD (hyperlipidemia)    Small bowel obstruction Management per surgery.   History of seizures Patient is on felbamate at group home. Admitting MD discussed with on-call neurologist Dr. Cyril Mourning. Since patient is n.p.o. It was recommended that she be placed on IV Keppra.   History of OSA No CPAP for now due to SBO and gastric distension noted on CT.  Leukocytosis WBC normal today. Atelectasis noted on CXR.  Mild hyperglycemia On SSI. HBA1C is pending.   History of hyperlipidemia Holding statin for now.   Code Status: Full Code  DVT Prophylaxis: SCD's       LOS: 1 day    [facility]  [facility] Pager [phone] 12/11/2013, 9:02 AM  If 8PM-8AM, please contact night-coverage at [email or web address], password [facility]

## 2013-12-11 NOTE — Progress Notes (Signed)
Unable to complete admission history, caregiver left when patient admitted from ER.  Will pass on to dayshift RN for followup

## 2013-12-11 NOTE — Progress Notes (Signed)
Patient ID: [name], female   DOB: 1946-03-03, 67 y.o.   MRN: [id number]  Subjective: Pt up to bathroom this morning, no BM, no flatus.  NGT to low intermittent suction.  Denies n/v.    Objective:  Vital signs:  Filed Vitals:   12/10/13 1918 12/10/13 2324 12/11/13 0114 12/11/13 0116  BP:  138/80 137/80   Pulse: 91 80 84   Temp:  98.8 F (37.1 C) 97.5 F (36.4 C)   TempSrc:  Oral Oral   Resp:  20 20   SpO2:  99% 91% 95%    Last BM Date: 12/10/13  Intake/Output   Yesterday:  12/17 0701 - 12/18 0700 In: 1653.3 [P.O.:40; I.V.:508.3; IV Piggyback:1105] Out: 1150 [Urine:300; Emesis/NG output:850] This shift:    I/O last 3 completed shifts: In: 1653.3 [P.O.:40; I.V.:508.3; IV Piggyback:1105] Out: 1150 [Urine:300; Emesis/NG output:850]    Physical Exam: General: Pt awake and alert.  NAD.  Has NGT. Chest: CTA bilaterally. No chest wall pain w good excursion CV:  Pulses intact.  Regular rhythm Abdomen: Soft.  distended.  Mld generalized TTP.  No evidence of peritonitis.  No incarcerated hernias.  She does have a right subcostal scar and lower midline abdominal scar. Ext:  SCDs BLE.  No mjr edema.  No cyanosis.  Soft wrist restraints. Skin: No petechiae / purpura   Problem List:   Active Problems:   CONVULSIONS, SEIZURES, NOS   SBO (small bowel obstruction)   Hyperglycemia   HLD (hyperlipidemia)   Results:   Labs: Results for orders placed during the hospital encounter of 12/10/13 (from the past 48 hour(s))  CBC WITH DIFFERENTIAL     Status: Abnormal   Collection Time    12/10/13  6:35 PM      Result Value Range   WBC 11.1 (*) 4.0 - 10.5 K/uL   RBC 4.41  3.87 - 5.11 MIL/uL   Hemoglobin 13.8  12.0 - 15.0 g/dL   HCT 40.9  81.1 - 91.4 %   MCV 90.9  78.0 - 100.0 fL   MCH 31.3  26.0 - 34.0 pg   MCHC 34.4  30.0 - 36.0 g/dL   RDW 78.2  95.6 - 21.3 %   Platelets 315  150 - 400 K/uL   Neutrophils Relative % 83 (*) 43 - 77 %   Neutro Abs 9.2 (*) 1.7 - 7.7 K/uL    Lymphocytes Relative 11 (*) 12 - 46 %   Lymphs Abs 1.2  0.7 - 4.0 K/uL   Monocytes Relative 6  3 - 12 %   Monocytes Absolute 0.6  0.1 - 1.0 K/uL   Eosinophils Relative 0  0 - 5 %   Eosinophils Absolute 0.0  0.0 - 0.7 K/uL   Basophils Relative 0  0 - 1 %   Basophils Absolute 0.0  0.0 - 0.1 K/uL  COMPREHENSIVE METABOLIC PANEL     Status: Abnormal   Collection Time    12/10/13  6:35 PM      Result Value Range   Sodium 139  135 - 145 mEq/L   Potassium 3.5  3.5 - 5.1 mEq/L   Chloride 105  96 - 112 mEq/L   CO2 24  19 - 32 mEq/L   Glucose, Bld 169 (*) 70 - 99 mg/dL   BUN 15  6 - 23 mg/dL   Creatinine, Ser 0.86  0.50 - 1.10 mg/dL   Calcium 9.4  8.4 - 57.8 mg/dL   Total Protein 7.3  6.0 -  8.3 g/dL   Albumin 3.4 (*) 3.5 - 5.2 g/dL   AST 19  0 - 37 U/L   ALT 15  0 - 35 U/L   Alkaline Phosphatase 52  39 - 117 U/L   Total Bilirubin 0.3  0.3 - 1.2 mg/dL   GFR calc non Af Amer 88 (*) >90 mL/min   GFR calc Af Amer >90  >90 mL/min   Comment: (NOTE)     The eGFR has been calculated using the CKD EPI equation.     This calculation has not been validated in all clinical situations.     eGFR's persistently <90 mL/min signify possible Chronic Kidney     Disease.  LIPASE, BLOOD     Status: None   Collection Time    12/10/13  6:35 PM      Result Value Range   Lipase 12  11 - 59 U/L  URINALYSIS, ROUTINE W REFLEX MICROSCOPIC     Status: Abnormal   Collection Time    12/10/13  6:59 PM      Result Value Range   Color, Urine AMBER (*) YELLOW   Comment: BIOCHEMICALS MAY BE AFFECTED BY COLOR   APPearance TURBID (*) CLEAR   Specific Gravity, Urine 1.035 (*) 1.005 - 1.030   pH 6.0  5.0 - 8.0   Glucose, UA NEGATIVE  NEGATIVE mg/dL   Hgb urine dipstick NEGATIVE  NEGATIVE   Bilirubin Urine SMALL (*) NEGATIVE   Ketones, ur NEGATIVE  NEGATIVE mg/dL   Protein, ur NEGATIVE  NEGATIVE mg/dL   Urobilinogen, UA 1.0  0.0 - 1.0 mg/dL   Nitrite NEGATIVE  NEGATIVE   Leukocytes, UA SMALL (*) NEGATIVE  URINE  MICROSCOPIC-ADD ON     Status: Abnormal   Collection Time    12/10/13  6:59 PM      Result Value Range   Squamous Epithelial / LPF FEW (*) RARE   WBC, UA 0-2  <3 WBC/hpf   Bacteria, UA FEW (*) RARE   Crystals CA OXALATE CRYSTALS (*) NEGATIVE   Urine-Other MUCOUS PRESENT     Comment: AMORPHOUS URATES/PHOSPHATES  CG4 I-STAT (LACTIC ACID)     Status: None   Collection Time    12/10/13  7:12 PM      Result Value Range   Lactic Acid, Venous 1.11  0.5 - 2.2 mmol/L  GLUCOSE, CAPILLARY     Status: Abnormal   Collection Time    12/11/13 12:49 AM      Result Value Range   Glucose-Capillary 142 (*) 70 - 99 mg/dL  GLUCOSE, CAPILLARY     Status: Abnormal   Collection Time    12/11/13  3:22 AM      Result Value Range   Glucose-Capillary 135 (*) 70 - 99 mg/dL  BASIC METABOLIC PANEL     Status: Abnormal   Collection Time    12/11/13  5:25 AM      Result Value Range   Sodium 140  135 - 145 mEq/L   Potassium 3.6  3.5 - 5.1 mEq/L   Chloride 102  96 - 112 mEq/L   CO2 30  19 - 32 mEq/L   Glucose, Bld 146 (*) 70 - 99 mg/dL   BUN 11  6 - 23 mg/dL   Creatinine, Ser 4.78  0.50 - 1.10 mg/dL   Calcium 9.2  8.4 - 29.5 mg/dL   GFR calc non Af Amer 84 (*) >90 mL/min   GFR calc Af Amer >90  >90  mL/min   Comment: (NOTE)     The eGFR has been calculated using the CKD EPI equation.     This calculation has not been validated in all clinical situations.     eGFR's persistently <90 mL/min signify possible Chronic Kidney     Disease.  CBC     Status: None   Collection Time    12/11/13  5:25 AM      Result Value Range   WBC 9.4  4.0 - 10.5 K/uL   RBC 4.03  3.87 - 5.11 MIL/uL   Hemoglobin 12.3  12.0 - 15.0 g/dL   HCT 16.1  09.6 - 04.5 %   MCV 92.1  78.0 - 100.0 fL   MCH 30.5  26.0 - 34.0 pg   MCHC 33.2  30.0 - 36.0 g/dL   RDW 40.9  81.1 - 91.4 %   Platelets 294  150 - 400 K/uL  GLUCOSE, CAPILLARY     Status: Abnormal   Collection Time    12/11/13  7:52 AM      Result Value Range    Glucose-Capillary 123 (*) 70 - 99 mg/dL    Imaging / Studies: Ct Abdomen Pelvis W Contrast  12/10/2013   CLINICAL DATA:  Abdominal pain, vomiting, evaluate for small bowel obstruction.  EXAM: CT ABDOMEN AND PELVIS WITH CONTRAST  TECHNIQUE: Multidetector CT imaging of the abdomen and pelvis was performed using the standard protocol following bolus administration of intravenous contrast.  CONTRAST:  OMNIPAQUE IOHEXOL 300 MG/ML SOLN, 50mL OMNIPAQUE IOHEXOL 300 MG/ML SOLN  COMPARISON:  None.  FINDINGS: Sagittal images of the spine shows extensive degenerative changes lumbar spine.  Small amount of perihepatic ascites. There is mild intra and extrahepatic biliary ductal dilatation. CBD measures 1.2 cm in diameter. Patient is status postcholecystectomy. Mild fatty replaced pancreas. The spleen and adrenal glands are unremarkable. Kidneys are symmetrical in size and enhancement. No hydronephrosis or hydroureter.  Delayed renal images shows bilateral renal symmetrical excretion. No aortic aneurysm. There are significant distended small bowel loops with multiple air-fluid levels. Postsurgical changes are noted anterior abdominal wall. Small amount of free fluid is noted in right paracolic gutter. The left colon and distal colon is decompressed small amount of pelvic free fluid is noted.  In axial image 65 there is transition point in caliber of distal small bowel. Findings are consistent with small bowel obstruction probable due to adhesions. The terminal ileum is small caliber decompressed. No any oral contrast material is noted in distal small bowel. Moderate stool is noted in transverse colon. Mild gastric distention with fluid and debris probable mild ileus without evidence of gastric outlet obstruction.  The uterus is atrophic. Urinary bladder is unremarkable. No destructive bony lesions are noted within pelvis.  IMPRESSION: 1. Findings consistent with distal small-bowel obstruction probable due to  adhesions. 2. Small amount of perihepatic fluid. Small amount of free fluid in right paracolic gutter and within posterior pelvis. 3. Degenerative changes lumbar spine. 4. Mild intrahepatic and extrahepatic biliary ductal dilatation. 5. Probable gastric ileus with gastric distension without evidence of gastric outlet obstruction.   Electronically Signed   By: Natasha Mead M.D.   On: 12/10/2013 19:57   Dg Chest Port 1 View  12/10/2013   CLINICAL DATA:  Check for infiltrates.  EXAM: PORTABLE CHEST - 1 VIEW  COMPARISON:  None.  FINDINGS: Low volume lungs with streaky opacities and interstitial prominence. No effusion or pneumothorax. Generous heart size without definite cardiopulmonary disease given technique. No  acute osseous findings.  IMPRESSION: 1. Bibasilar opacities represent atelectasis when correlated with abdominal CT from earlier the same day. 2. Low lung volumes.   Electronically Signed   By: Tiburcio Pea M.D.   On: 12/10/2013 23:04    Assessment/Plan MR lives in a group home Seizure disorder SBO likely 2/2 adhesions  -continue with bowel rest, NGT to low intermittent suction -await KUB results from this AM -monitor electrolytes  -IV hydration -anti-emetics -mobilize  Ashok Norris, [facility] Surgery Pager [phone] Office [phone]  12/11/2013 8:17 AM  Agree with above. KUB shows moderate small bowel distention.  Patient did not speak except to ask if she could have ice.  Ovidio Kin, MD, [facility] Surgery Pager: [phone] Office phone:  [phone]

## 2013-12-11 NOTE — ED Notes (Signed)
NG tube placed, per orders During and after procedure, patient attempting to pull out tube Order for restraints obtained and patient placed in bilateral soft wrist restraints

## 2013-12-11 NOTE — Progress Notes (Signed)
Clinical Social Work Department BRIEF PSYCHOSOCIAL ASSESSMENT 12/11/2013  Patient:  [name], [name]     Account Number:  [id number]     Admit date:  12/10/2013  Clinical Social Worker:  Candie Chroman  Date/Time:  12/11/2013 11:41 AM  Referred by:  Care Management  Date Referred:  12/11/2013 Referred for  Other - See comment   Other Referral:   [facility]   Interview type:  Other - See comment Other interview type:    PSYCHOSOCIAL DATA Living Status:  FACILITY Admitted from facility:  [facility] Level of care:  Group Home Primary support name:  Garrel Ridgel Primary support relationship to patient:  SIBLING Degree of support available:   unclear    CURRENT CONCERNS Current Concerns  Post-Acute Placement   Other Concerns:    SOCIAL WORK ASSESSMENT / PLAN Pt is a 67 yr old female with severe intellectual disability admitted from [facility] ( [facility]. CSW spoke with [place] from [facility] to assist with d/c planning. Clinical info has been sent to [facility]. Group home is planning to readmit pt , when stable, providing pt's needs can be met at their facility. CSW will maintain contact with [facility] to assist with d/c planning. [facility] doesn't require an FL2 at d/c.   Assessment/plan status:  Psychosocial Support/Ongoing Assessment of Needs Other assessment/ plan:   Information/referral to community resources:   None needed at this time.    PATIENT'S/FAMILY'S RESPONSE TO PLAN OF CARE: Pt's sister/guardian would like pt to return to [facility] when stable.    Cori Razor LCSW [phone]

## 2013-12-12 ENCOUNTER — Inpatient Hospital Stay (HOSPITAL_COMMUNITY): Payer: Medicare Other

## 2013-12-12 LAB — CBC
Hemoglobin: 12.5 g/dL (ref 12.0–15.0)
MCHC: 33.7 g/dL (ref 30.0–36.0)
MCV: 92.3 fL (ref 78.0–100.0)
Platelets: 273 10*3/uL (ref 150–400)
RDW: 13.7 % (ref 11.5–15.5)

## 2013-12-12 LAB — BASIC METABOLIC PANEL
BUN: 8 mg/dL (ref 6–23)
Calcium: 8.7 mg/dL (ref 8.4–10.5)
GFR calc Af Amer: >90 mL/min (ref >90)
GFR calc non Af Amer: 87 mL/min — ABNORMAL LOW (ref >90)
Glucose, Bld: 124 mg/dL — ABNORMAL HIGH (ref 70–99)
Potassium: 3.6 mEq/L (ref 3.5–5.1)
Sodium: 141 mEq/L (ref 135–145)

## 2013-12-12 LAB — GLUCOSE, CAPILLARY
Glucose-Capillary: 111 mg/dL — ABNORMAL HIGH (ref 70–99)
Glucose-Capillary: 121 mg/dL — ABNORMAL HIGH (ref 70–99)
Glucose-Capillary: 179 mg/dL — ABNORMAL HIGH (ref 70–99)
Glucose-Capillary: 197 mg/dL — ABNORMAL HIGH (ref 70–99)

## 2013-12-12 NOTE — Progress Notes (Signed)
CSW continues to follow this pt from [facility]. Weekend CSW can contact [place] at [facility] if pt is stable for d/c over the weekend. D/C needs can be reviewed with Lynnell Catalan to determine if a weekend d/c is possible.  Cori Razor LCSW [phone]

## 2013-12-12 NOTE — Progress Notes (Signed)
Patient ID: [name], female   DOB: [birth year], 67 y.o.   MRN: [id number]    Subjective: Pt denies abdominal pain.  Denies flatus or BM. Update: RN just reported large formed BM  Objective: Vital signs in last 24 hours: Temp:  [97 F (36.1 C)-97.8 F (36.6 C)] 97.8 F (36.6 C) (12/19 0600) Pulse Rate:  [72-86] 85 (12/19 0600) Resp:  [18] 18 (12/19 0600) BP: (119-148)/(75-83) 134/76 mmHg (12/19 0600) SpO2:  [90 %] 90 % (12/19 0600) Last BM Date: 12/10/13  Intake/Output from previous day: 12/18 0701 - 12/19 0700 In: 3155 [I.V.:2355] Out: 3250 [Urine:3200; Emesis/NG output:50] Intake/Output this shift: Total I/O In: 0  Out: 600 [Urine:600]  PE: Abd: soft, NT, mild distention, +BS, NGT with minimal output Heart: regular Lungs: CTAB  Lab Results:   Recent Labs  12/11/13 0525 12/12/13 0435  WBC 9.4 9.9  HGB 12.3 12.5  HCT 37.1 37.1  PLT 294 273   BMET  Recent Labs  12/11/13 0525 12/12/13 0435  NA 140 141  K 3.6 3.6  CL 102 108  CO2 30 25  GLUCOSE 146* 124*  BUN 11 8  CREATININE 0.77 0.69  CALCIUM 9.2 8.7   PT/INR No results found for this basename: LABPROT, INR,  in the last 72 hours CMP     Component Value Date/Time   NA 141 12/12/2013 0435   K 3.6 12/12/2013 0435   CL 108 12/12/2013 0435   CO2 25 12/12/2013 0435   GLUCOSE 124* 12/12/2013 0435   BUN 8 12/12/2013 0435   CREATININE 0.69 12/12/2013 0435   CALCIUM 8.7 12/12/2013 0435   PROT 7.3 12/10/2013 1835   ALBUMIN 3.4* 12/10/2013 1835   AST 19 12/10/2013 1835   ALT 15 12/10/2013 1835   ALKPHOS 52 12/10/2013 1835   BILITOT 0.3 12/10/2013 1835   GFRNONAA 87* 12/12/2013 0435   GFRAA >90 12/12/2013 0435   Lipase     Component Value Date/Time   LIPASE 12 12/10/2013 1835       Studies/Results: Ct Abdomen Pelvis W Contrast  12/10/2013   CLINICAL DATA:  Abdominal pain, vomiting, evaluate for small bowel obstruction.  EXAM: CT ABDOMEN AND PELVIS WITH CONTRAST  TECHNIQUE: Multidetector  CT imaging of the abdomen and pelvis was performed using the standard protocol following bolus administration of intravenous contrast.  CONTRAST:  OMNIPAQUE IOHEXOL 300 MG/ML SOLN, 50mL OMNIPAQUE IOHEXOL 300 MG/ML SOLN  COMPARISON:  None.  FINDINGS: Sagittal images of the spine shows extensive degenerative changes lumbar spine.  Small amount of perihepatic ascites. There is mild intra and extrahepatic biliary ductal dilatation. CBD measures 1.2 cm in diameter. Patient is status postcholecystectomy. Mild fatty replaced pancreas. The spleen and adrenal glands are unremarkable. Kidneys are symmetrical in size and enhancement. No hydronephrosis or hydroureter.  Delayed renal images shows bilateral renal symmetrical excretion. No aortic aneurysm. There are significant distended small bowel loops with multiple air-fluid levels. Postsurgical changes are noted anterior abdominal wall. Small amount of free fluid is noted in right paracolic gutter. The left colon and distal colon is decompressed small amount of pelvic free fluid is noted.  In axial image 65 there is transition point in caliber of distal small bowel. Findings are consistent with small bowel obstruction probable due to adhesions. The terminal ileum is small caliber decompressed. No any oral contrast material is noted in distal small bowel. Moderate stool is noted in transverse colon. Mild gastric distention with fluid and debris probable mild ileus without evidence  of gastric outlet obstruction.  The uterus is atrophic. Urinary bladder is unremarkable. No destructive bony lesions are noted within pelvis.  IMPRESSION: 1. Findings consistent with distal small-bowel obstruction probable due to adhesions. 2. Small amount of perihepatic fluid. Small amount of free fluid in right paracolic gutter and within posterior pelvis. 3. Degenerative changes lumbar spine. 4. Mild intrahepatic and extrahepatic biliary ductal dilatation. 5. Probable gastric ileus with  gastric distension without evidence of gastric outlet obstruction.   Electronically Signed   By: Natasha Mead M.D.   On: 12/10/2013 19:57   Dg Chest Port 1 View  12/10/2013   CLINICAL DATA:  Check for infiltrates.  EXAM: PORTABLE CHEST - 1 VIEW  COMPARISON:  None.  FINDINGS: Low volume lungs with streaky opacities and interstitial prominence. No effusion or pneumothorax. Generous heart size without definite cardiopulmonary disease given technique. No acute osseous findings.  IMPRESSION: 1. Bibasilar opacities represent atelectasis when correlated with abdominal CT from earlier the same day. 2. Low lung volumes.   Electronically Signed   By: Tiburcio Pea M.D.   On: 12/10/2013 23:04   Dg Abd 2 Views  12/12/2013   CLINICAL DATA:  Abdominal pain.  Small-bowel obstruction.  EXAM: ABDOMEN - 2 VIEW  COMPARISON:  None.  FINDINGS: NG tube is noted in stable position stomach. Surgical clips right upper quadrant consistent with prior cholecystectomy. Surgical sutures in the pelvis. Persistent severely distended loops of small bowel are noted. This is consistent small bowel obstruction. Stool is present in the colon. No free air is identified.  IMPRESSION: Persistent severely distended loops of small bowel consistent with small bowel obstruction. No free air. NG tube in stable position projected over stomach.   Electronically Signed   By: Maisie Fus  Register   On: 12/12/2013 10:26   Dg Abd Portable 1v  12/11/2013   CLINICAL DATA:  Persistent abdominal distention.  EXAM: PORTABLE ABDOMEN - 1 VIEW  COMPARISON:  CT scan of the abdomen pelvis dated December 10, 2013.  FINDINGS: Supine and upright abdominal films reveal the presence of a nasogastric tube whose proximal port lies in the region of the body of the stomach with the tip  Approaching the pylorus. There remain loops of moderately distended gas-filled small bowel in the mid and lower abdomen. There is no definite gas within the rectum. Small amounts of stool and  gas within the ascending and descending portions of the colon are noted. There are surgical clips in the gallbladder fossa. There is likely atelectasis at the lung bases. There is contrast within the urinary bladder from yesterday's CT scan. The observed portions of the bony structures exhibit no acute abnormalities.  IMPRESSION: 1. Persistent moderate distention of small bowel loops with gas is consistent with a fairly distal small bowel obstruction. 2. No free extraluminal gas collections are demonstrated. 3. The nasogastric tube appears to be in appropriate position. 4. Bibasilar atelectasis is suspected.   Electronically Signed   By: Ariani Seier  [place]   On: 12/11/2013 08:28    Anti-infectives: Anti-infectives   None      Assessment/Plan 1. SBO 2. Seizure d/o  3. MR  Plan: 1. PACs system is down so we are unable to view her imaging right now.  She is soft and NT.  Her NGT output is minimal and she just a large BM.  Until we can view her films and see how she does, I would like to leave her NGT for now.  Cont to follow.  LOS: 2  days   OSBORNE,KELLY E 12/12/2013, 10:41 AM Pager: [phone]  Agree with above. Difficult management, because the patient is essentially non-communicative. Her KUB appears to show contrast in colon, she still has a loop of dilated SB, but overall is better.  She also had a BM today.  Ovidio Kin, MD, [facility] Pager: [phone] Office phone:  [phone]

## 2013-12-12 NOTE — Progress Notes (Signed)
[facility] PROGRESS NOTE  [name]:[id number] DOB: 23-Oct-1946 DOA: 12/10/2013  PCP: Unknown  Brief HPI: Patient has a history of seizures, hyperlipidemia, mental retardation who lives at group home and was brought to the ER as patient was having abdominal pain with nausea vomiting. In the ER CT abdomen and pelvis showed distal bowel obstruction probably from adhesions. Patient was admitted for small bowel obstruction and hospitalist was consulted for medical management.   Past medical history:  Past Medical History  Diagnosis Date  . Hypercholesteremia   . Constipation   . Menopause   . Osteoporosis   . Seizure   . Edema     Primary Service: Gen Surgery  Procedures: None  Antibiotics: None  Subjective: Patient with mental retardation and so difficult to elicit history from her. But she did state that pain is better today. No nausea or vomiting. Not passing gas.   Objective: Vital Signs  Filed Vitals:   12/11/13 1400 12/11/13 2200 12/12/13 0200 12/12/13 0600  BP: 119/75 137/79 148/83 134/76  Pulse: 74 72 86 85  Temp: 97 F (36.1 C) 97.6 F (36.4 C) 97.4 F (36.3 C) 97.8 F (36.6 C)  TempSrc: Oral Oral Oral Oral  Resp: 18 18 18 18   SpO2: 90% 90% 90% 90%    Intake/Output Summary (Last 24 hours) at 12/12/13 0924 Last data filed at 12/12/13 0645  Gross per 24 hour  Intake   3155 ml  Output   3250 ml  Net    -95 ml   General appearance: alert, appears stated age, distracted and moderately obese Nose: NG tube noted Resp: clear to auscultation bilaterally Cardio: regular rate and rhythm, S1, S2 normal, no murmur, click, rub or gallop GI: Slightly distended. Non tender.  No BS. No masses or organomegaly. Extremities: extremities normal, atraumatic, no cyanosis or edema urgor normal. No rashes or lesions Neurologic: No focal deficits  Lab Results:  Basic Metabolic Panel:  Recent Labs Lab 12/10/13 1835 12/11/13 0525 12/12/13 0435  NA 139 140  141  K 3.5 3.6 3.6  CL 105 102 108  CO2 24 30 25   GLUCOSE 169* 146* 124*  BUN 15 11 8   CREATININE 0.66 0.77 0.69  CALCIUM 9.4 9.2 8.7   Liver Function Tests:  Recent Labs Lab 12/10/13 1835  AST 19  ALT 15  ALKPHOS 52  BILITOT 0.3  PROT 7.3  ALBUMIN 3.4*    Recent Labs Lab 12/10/13 1835  LIPASE 12   CBC:  Recent Labs Lab 12/10/13 1835 12/11/13 0525 12/12/13 0435  WBC 11.1* 9.4 9.9  NEUTROABS 9.2*  --   --   HGB 13.8 12.3 12.5  HCT 40.1 37.1 37.1  MCV 90.9 92.1 92.3  PLT 315 294 273   CBG:  Recent Labs Lab 12/11/13 1553 12/11/13 2000 12/11/13 2348 12/12/13 0359 12/12/13 0805  GLUCAP 126* 114* 137* 121* 122*    No results found for this or any previous visit (from the past 240 hour(s)).    Studies/Results: Ct Abdomen Pelvis W Contrast  12/10/2013   CLINICAL DATA:  Abdominal pain, vomiting, evaluate for small bowel obstruction.  EXAM: CT ABDOMEN AND PELVIS WITH CONTRAST  TECHNIQUE: Multidetector CT imaging of the abdomen and pelvis was performed using the standard protocol following bolus administration of intravenous contrast.  CONTRAST:  OMNIPAQUE IOHEXOL 300 MG/ML SOLN, 50mL OMNIPAQUE IOHEXOL 300 MG/ML SOLN  COMPARISON:  None.  FINDINGS: Sagittal images of the spine shows extensive degenerative changes lumbar spine.  Small amount of perihepatic ascites. There is mild intra and extrahepatic biliary ductal dilatation. CBD measures 1.2 cm in diameter. Patient is status postcholecystectomy. Mild fatty replaced pancreas. The spleen and adrenal glands are unremarkable. Kidneys are symmetrical in size and enhancement. No hydronephrosis or hydroureter.  Delayed renal images shows bilateral renal symmetrical excretion. No aortic aneurysm. There are significant distended small bowel loops with multiple air-fluid levels. Postsurgical changes are noted anterior abdominal wall. Small amount of free fluid is noted in right paracolic gutter. The left colon and distal  colon is decompressed small amount of pelvic free fluid is noted.  In axial image 65 there is transition point in caliber of distal small bowel. Findings are consistent with small bowel obstruction probable due to adhesions. The terminal ileum is small caliber decompressed. No any oral contrast material is noted in distal small bowel. Moderate stool is noted in transverse colon. Mild gastric distention with fluid and debris probable mild ileus without evidence of gastric outlet obstruction.  The uterus is atrophic. Urinary bladder is unremarkable. No destructive bony lesions are noted within pelvis.  IMPRESSION: 1. Findings consistent with distal small-bowel obstruction probable due to adhesions. 2. Small amount of perihepatic fluid. Small amount of free fluid in right paracolic gutter and within posterior pelvis. 3. Degenerative changes lumbar spine. 4. Mild intrahepatic and extrahepatic biliary ductal dilatation. 5. Probable gastric ileus with gastric distension without evidence of gastric outlet obstruction.   Electronically Signed   By: Natasha Mead M.D.   On: 12/10/2013 19:57   Dg Chest Port 1 View  12/10/2013   CLINICAL DATA:  Check for infiltrates.  EXAM: PORTABLE CHEST - 1 VIEW  COMPARISON:  None.  FINDINGS: Low volume lungs with streaky opacities and interstitial prominence. No effusion or pneumothorax. Generous heart size without definite cardiopulmonary disease given technique. No acute osseous findings.  IMPRESSION: 1. Bibasilar opacities represent atelectasis when correlated with abdominal CT from earlier the same day. 2. Low lung volumes.   Electronically Signed   By: Tiburcio Pea M.D.   On: 12/10/2013 23:04   Dg Abd Portable 1v  12/11/2013   CLINICAL DATA:  Persistent abdominal distention.  EXAM: PORTABLE ABDOMEN - 1 VIEW  COMPARISON:  CT scan of the abdomen pelvis dated December 10, 2013.  FINDINGS: Supine and upright abdominal films reveal the presence of a nasogastric tube whose proximal  port lies in the region of the body of the stomach with the tip  Approaching the pylorus. There remain loops of moderately distended gas-filled small bowel in the mid and lower abdomen. There is no definite gas within the rectum. Small amounts of stool and gas within the ascending and descending portions of the colon are noted. There are surgical clips in the gallbladder fossa. There is likely atelectasis at the lung bases. There is contrast within the urinary bladder from yesterday's CT scan. The observed portions of the bony structures exhibit no acute abnormalities.  IMPRESSION: 1. Persistent moderate distention of small bowel loops with gas is consistent with a fairly distal small bowel obstruction. 2. No free extraluminal gas collections are demonstrated. 3. The nasogastric tube appears to be in appropriate position. 4. Bibasilar atelectasis is suspected.   Electronically Signed   By: David  [place]   On: 12/11/2013 08:28    Medications:  Scheduled: . levETIRAcetam  500 mg Intravenous Q12H  . pantoprazole (PROTONIX) IV  40 mg Intravenous QHS   Continuous: . dextrose 5 % and 0.9 % NaCl with KCl 20  mEq/L 100 mL/hr at 12/12/13 1610   RUE:[id number] injection, ondansetron  Assessment/Plan:  Active Problems:   CONVULSIONS, SEIZURES, NOS   SBO (small bowel obstruction)   Hyperglycemia   HLD (hyperlipidemia)    Small bowel obstruction Management per surgery.   History of seizures Patient is on felbamate at group home. Admitting MD discussed with on-call neurologist Dr. Cyril Mourning. Since patient is n.p.o. It was recommended that she be placed on IV Keppra which is being continued.  History of OSA No CPAP for now due to SBO and gastric distension noted on CT.  Leukocytosis WBC continues to be normal. Atelectasis noted on CXR. UA was abnormal but not suggestive of infection.  Mild hyperglycemia On SSI. HBA1C is 5.4.   History of hyperlipidemia Holding statin for now.   Code Status: Full  Code  DVT Prophylaxis: SCD's     [facility] will continue to follow along.   LOS: 2 days   [facility]  [facility] Pager [phone] 12/12/2013, 9:24 AM  If 8PM-8AM, please contact night-coverage at [email or web address], password [email or web address]

## 2013-12-12 NOTE — Progress Notes (Signed)
Physical Therapy Treatment Patient Details Name: [name] MRN: [id number] DOB: 1946/01/23 Today's Date: 12/12/2013 Time: 7829-5621 PT Time Calculation (min): 24 min  PT Assessment / Plan / Recommendation  History of Present Illness 67 yo female admitted with SBO. From Group Home per chart   PT Comments   Pt in bed with NG suction tube.  RN called to room to temp disconnect for AMB.  Pt required MAX encouragement to participate as she repeated "sleep" every time I instructed her".  Did assist pt OOB to amb in hallway twice with one sitting rest break as pt demon signs of fatigue.     Follow Up Recommendations   Return to Group Home     Does the patient have the potential to tolerate intense rehabilitation     Barriers to Discharge        Equipment Recommendations       Recommendations for Other Services    Frequency Min 3X/week   Progress towards PT Goals Progress towards PT goals: Progressing toward goals  Plan      Precautions / Restrictions Precautions Precautions: Fall Precaution Comments: NG tube Restrictions Weight Bearing Restrictions: No    Pertinent Vitals/Pain C/o ABD pain Unable to rate but tolerated session well.    Mobility  Bed Mobility Bed Mobility: Supine to Sit Supine to Sit: 4: Min assist;HOB elevated;With rails Details for Bed Mobility Assistance: Increased time and encouragementfor pt to participate. Assist for trunk to full upright although pt may be able to peform unassisted. Assist needed to initiate task.  Transfers Transfers: Sit to Stand;Stand to Sit Sit to Stand: 4: Min assist;From bed;4: Min guard Stand to Sit: 4: Min assist;To bed;4: Min guard Details for Transfer Assistance: Assist to rise, stabilize, control descent.  Ambulation/Gait Ambulation/Gait Assistance: 4: Min assist Ambulation Distance (Feet): 28 Feet Assistive device: Rolling walker Ambulation/Gait Assistance Details: increased time and extra assist to advance RW  straight and around obsticles.  Also required 50% VC's to increase posture and 75% VC's on turn completion prior to sit with hand over hand cueing to reach back for chair prior to sit. Gait Pattern: Step-through pattern Gait velocity: decreased    PT Goals (current goals can now be found in the care plan section)    Visit Information  Last PT Received On: 12/12/13 Assistance Needed: +1 History of Present Illness: 67 yo female admitted with SBO. From Group Home per chart    Subjective Data      Cognition       Balance     End of Session PT - End of Session Equipment Utilized During Treatment: Gait belt Activity Tolerance: Patient limited by fatigue Patient left: in chair;with call bell/phone within reach;with family/visitor present   Felecia Shelling  PTA [facility]  Acute  Rehab Pager      [phone]

## 2013-12-13 ENCOUNTER — Inpatient Hospital Stay (HOSPITAL_COMMUNITY): Payer: Medicare Other

## 2013-12-13 DIAGNOSIS — R030 Elevated blood-pressure reading, without diagnosis of hypertension: Secondary | ICD-10-CM

## 2013-12-13 LAB — GLUCOSE, CAPILLARY
Glucose-Capillary: 127 mg/dL — ABNORMAL HIGH (ref 70–99)
Glucose-Capillary: 127 mg/dL — ABNORMAL HIGH (ref 70–99)
Glucose-Capillary: 94 mg/dL (ref 70–99)

## 2013-12-13 MED ORDER — HYDRALAZINE HCL 20 MG/ML IJ SOLN: 5.0000 mg | Freq: Four times a day (QID) | INTRAMUSCULAR | Status: DC | PRN

## 2013-12-13 NOTE — Progress Notes (Signed)
[facility] PROGRESS NOTE  [name] AVW:[id number] DOB: 05-Jun-1946 DOA: 12/10/2013  PCP: Unknown  Brief HPI: Patient has a history of seizures, hyperlipidemia, mental retardation who lives at group home and was brought to the ER as patient was having abdominal pain with nausea vomiting. In the ER CT abdomen and pelvis showed distal bowel obstruction probably from adhesions. Patient was admitted for small bowel obstruction and hospitalist was consulted for medical management.   Past medical history:  Past Medical History  Diagnosis Date  . Hypercholesteremia   . Constipation   . Menopause   . Osteoporosis   . Seizure   . Edema     Primary Service: Gen Surgery  Procedures: None  Antibiotics: None  Subjective: Patient with mental retardation and so difficult to elicit history from her. Patient states pain is better. Apparently had a BM yesterday. No nausea or vomiting.   Objective: Vital Signs  Filed Vitals:   12-18-13 0600 2013/12/18 1420 2013-12-18 2140 12/13/13 0510  BP: 134/76 167/84 159/77 171/81  Pulse: 85 77 76 72  Temp: 97.8 F (36.6 C) 97.9 F (36.6 C) 98 F (36.7 C) 97.7 F (36.5 C)  TempSrc: Oral Oral Oral Oral  Resp: 18 16 16 18   SpO2: 90% 95% 95% 99%    Intake/Output Summary (Last 24 hours) at 12/13/13 0815 Last data filed at 12/13/13 0600  Gross per 24 hour  Intake   2820 ml  Output   3103 ml  Net   -283 ml   General appearance: alert, appears stated age, distracted and moderately obese Nose: NG tube noted Resp: clear to auscultation bilaterally Cardio: regular rate and rhythm, S1, S2 normal, no murmur, click, rub or gallop GI: Slightly distended. Non tender.  No BS. No masses or organomegaly. Extremities: extremities normal, atraumatic, no cyanosis or edema Neurologic: No focal deficits  Lab Results:  Basic Metabolic Panel:  Recent Labs Lab 12/10/13 1835 12/11/13 0525 12-18-13 0435  NA 139 140 141  K 3.5 3.6 3.6  CL 105 102  108  CO2 24 30 25   GLUCOSE 169* 146* 124*  BUN 15 11 8   CREATININE 0.66 0.77 0.69  CALCIUM 9.4 9.2 8.7   Liver Function Tests:  Recent Labs Lab 12/10/13 1835  AST 19  ALT 15  ALKPHOS 52  BILITOT 0.3  PROT 7.3  ALBUMIN 3.4*    Recent Labs Lab 12/10/13 1835  LIPASE 12   CBC:  Recent Labs Lab 12/10/13 1835 12/11/13 0525 December 18, 2013 0435  WBC 11.1* 9.4 9.9  NEUTROABS 9.2*  --   --   HGB 13.8 12.3 12.5  HCT 40.1 37.1 37.1  MCV 90.9 92.1 92.3  PLT 315 294 273   CBG:  Recent Labs Lab 12-18-2013 1201 12-18-13 1613 December 18, 2013 1954 2013/12/18 2333 12/13/13 0338  GLUCAP 129* 111* 197* 179* 127*    No results found for this or any previous visit (from the past 240 hour(s)).    Studies/Results: Dg Abd 2 Views  12-18-2013   CLINICAL DATA:  Abdominal pain.  Small-bowel obstruction.  EXAM: ABDOMEN - 2 VIEW  COMPARISON:  None.  FINDINGS: NG tube is noted in stable position stomach. Surgical clips right upper quadrant consistent with prior cholecystectomy. Surgical sutures in the pelvis. Persistent severely distended loops of small bowel are noted. This is consistent small bowel obstruction. Stool is present in the colon. No free air is identified.  IMPRESSION: Persistent severely distended loops of small bowel consistent with small bowel obstruction. No free  air. NG tube in stable position projected over stomach.   Electronically Signed   By: Maisie Fus  Register   On: 12/12/2013 10:26   Dg Abd Portable 1v  12/11/2013   CLINICAL DATA:  Persistent abdominal distention.  EXAM: PORTABLE ABDOMEN - 1 VIEW  COMPARISON:  CT scan of the abdomen pelvis dated December 10, 2013.  FINDINGS: Supine and upright abdominal films reveal the presence of a nasogastric tube whose proximal port lies in the region of the body of the stomach with the tip  Approaching the pylorus. There remain loops of moderately distended gas-filled small bowel in the mid and lower abdomen. There is no definite gas within  the rectum. Small amounts of stool and gas within the ascending and descending portions of the colon are noted. There are surgical clips in the gallbladder fossa. There is likely atelectasis at the lung bases. There is contrast within the urinary bladder from yesterday's CT scan. The observed portions of the bony structures exhibit no acute abnormalities.  IMPRESSION: 1. Persistent moderate distention of small bowel loops with gas is consistent with a fairly distal small bowel obstruction. 2. No free extraluminal gas collections are demonstrated. 3. The nasogastric tube appears to be in appropriate position. 4. Bibasilar atelectasis is suspected.   Electronically Signed   By: David  [place]   On: 12/11/2013 08:28    Medications:  Scheduled: . levETIRAcetam  500 mg Intravenous Q12H  . pantoprazole (PROTONIX) IV  40 mg Intravenous QHS   Continuous: . dextrose 5 % and 0.9 % NaCl with KCl 20 mEq/L 100 mL/hr at 12/12/13 2028   AVW:[id number], morphine injection, ondansetron  Assessment/Plan:  Active Problems:   CONVULSIONS, SEIZURES, NOS   SBO (small bowel obstruction)   Hyperglycemia   HLD (hyperlipidemia)    Small bowel obstruction Management per surgery.   History of seizures Patient is on felbamate at group home. Admitting MD discussed with on-call neurologist Dr. Cyril Mourning. Since patient is n.p.o. It was recommended that she be placed on IV Keppra which is being continued. No seizures noted.  Elevated BP No known history of HTN. Will give hydralazine IV as needed. Noted to be on Lasix at group home. Will assess need for antihypertensives once she is improved.  History of OSA No CPAP for now due to SBO and gastric distension noted on CT.  Leukocytosis WBC continues to be normal. Atelectasis noted on CXR. UA was abnormal but not suggestive of infection.  Mild hyperglycemia On SSI. HBA1C is 5.4.   History of hyperlipidemia Holding statin for now.   Code Status: Full Code  DVT  Prophylaxis: SCD's     [facility] will continue to follow along.   LOS: 3 days   [facility]  [facility] Pager [phone] 12/13/2013, 8:15 AM  If 8PM-8AM, please contact night-coverage at [email or web address], password [facility]

## 2013-12-13 NOTE — Progress Notes (Signed)
Patient ID: Rica Mast, female   DOB: 07-29-1946, 67 y.o.   MRN: [id number]    Subjective: Not very communicative but able to deny pain. Several bowel movements recorded by nursing yesterday  Objective: Vital signs in last 24 hours: Temp:  [97.7 F (36.5 C)-98 F (36.7 C)] 97.7 F (36.5 C) 15-Dec-2023 0510) Pulse Rate:  [72-77] 72 2023-12-15 0510) Resp:  [16-18] 18 12/15/2023 0510) BP: (159-171)/(77-84) 171/81 mmHg 12/15/2023 0510) SpO2:  [95 %-99 %] 99 % 12-15-23 0510) Last BM Date: 12/12/13  Intake/Output from previous day: 12/19 0701 - 2023/12/15 0700 In: 2820 [I.V.:2400; IV Piggyback:420] Out: 3203 [Urine:3200; Stool:3] Intake/Output this shift:    General appearance: cooperative and no distress GI: normal findings: soft, non-tender and nondistended  Lab Results:   Recent Labs  12/11/13 0525 12/12/13 0435  WBC 9.4 9.9  HGB 12.3 12.5  HCT 37.1 37.1  PLT 294 273   BMET  Recent Labs  12/11/13 0525 12/12/13 0435  NA 140 141  K 3.6 3.6  CL 102 108  CO2 30 25  GLUCOSE 146* 124*  BUN 11 8  CREATININE 0.77 0.69  CALCIUM 9.2 8.7     Studies/Results: Dg Abd 2 Views  12/14/13   CLINICAL DATA:  Abdominal pain and distention, recent small bowel obstruction.  EXAM: ABDOMEN - 2 VIEW  COMPARISON:  December 12, 2013  FINDINGS: On today's study the degree of distention of small bowel loops has decreased. There there are a few loops of mildly distended small bowel in the mid abdomen. There is contrast and gas within normal calibered colonic loops. There is a small amount of stool and gas in the rectum. The nasogastric tube tip and proximal port lie below the level of the GE junction and stomach does not appear distended. There are surgical clips in the gallbladder fossa.  IMPRESSION: The findings are consistent with improvement in the small bowel obstruction. Only mild gaseous distention of a few small bowel loops is demonstrated today.   Electronically Signed   By: David  [place]   On:  2013/12/14 08:29   Dg Abd 2 Views  12/12/2013   CLINICAL DATA:  Abdominal pain.  Small-bowel obstruction.  EXAM: ABDOMEN - 2 VIEW  COMPARISON:  None.  FINDINGS: NG tube is noted in stable position stomach. Surgical clips right upper quadrant consistent with prior cholecystectomy. Surgical sutures in the pelvis. Persistent severely distended loops of small bowel are noted. This is consistent small bowel obstruction. Stool is present in the colon. No free air is identified.  IMPRESSION: Persistent severely distended loops of small bowel consistent with small bowel obstruction. No free air. NG tube in stable position projected over stomach.   Electronically Signed   By: Maisie Fus  Register   On: 12/12/2013 10:26    Anti-infectives: Anti-infectives   None      Assessment/Plan: Small bowel obstruction which seems to be resolving. Will discontinue NG tube and start clear liquid diet.    LOS: 3 days    Litsy Epting T 12-14-2013

## 2013-12-14 LAB — BASIC METABOLIC PANEL
BUN: 5 mg/dL — ABNORMAL LOW (ref 6–23)
CO2: 21 mEq/L (ref 19–32)
Chloride: 105 mEq/L (ref 96–112)
Creatinine, Ser: 0.64 mg/dL (ref 0.50–1.10)
GFR calc Af Amer: >90 mL/min (ref >90)
GFR calc non Af Amer: 89 mL/min — ABNORMAL LOW (ref >90)
Glucose, Bld: 106 mg/dL — ABNORMAL HIGH (ref 70–99)
Potassium: 3.7 mEq/L (ref 3.5–5.1)

## 2013-12-14 LAB — GLUCOSE, CAPILLARY
Glucose-Capillary: 169 mg/dL — ABNORMAL HIGH (ref 70–99)
Glucose-Capillary: 100 mg/dL — ABNORMAL HIGH (ref 70–99)

## 2013-12-14 MED ORDER — FELBAMATE 600 MG PO TABS: 600.0000 mg | ORAL_TABLET | Freq: Two times a day (BID) | ORAL | Status: DC

## 2013-12-14 MED ORDER — FELBAMATE 600 MG/5ML PO SUSP
600.0000 mg | Freq: Two times a day (BID) | ORAL | Status: DC
  Administered 2013-12-14 – 2013-12-15 (×3): 600 mg via ORAL
  Filled 2013-12-14 (×5): qty 5

## 2013-12-14 MED ORDER — PANTOPRAZOLE SODIUM 40 MG PO TBEC
40.0000 mg | DELAYED_RELEASE_TABLET | Freq: Every day | ORAL | Status: DC
  Administered 2013-12-14 – 2013-12-15 (×2): 40 mg via ORAL
  Filled 2013-12-14 (×2): qty 1

## 2013-12-14 MED ORDER — LEVETIRACETAM 500 MG PO TABS
500.0000 mg | ORAL_TABLET | Freq: Two times a day (BID) | ORAL | Status: DC
Start: 2013-12-14 — End: 2013-12-14
  Filled 2013-12-14 (×2): qty 1

## 2013-12-14 MED ORDER — ATORVASTATIN CALCIUM 10 MG PO TABS
10.0000 mg | ORAL_TABLET | Freq: Every day | ORAL | Status: DC
  Administered 2013-12-14: 10 mg via ORAL
  Filled 2013-12-14 (×2): qty 1

## 2013-12-14 MED ORDER — KCL IN DEXTROSE-NACL 20-5-0.9 MEQ/L-%-% IV SOLN
INTRAVENOUS | Status: DC
  Filled 2013-12-14: qty 1000

## 2013-12-14 MED ORDER — FELBAMATE 600 MG/5ML PO SUSP
600.0000 mg | Freq: Two times a day (BID) | ORAL | Status: DC
Start: 2013-12-14 — End: 2013-12-14
  Filled 2013-12-14 (×2): qty 5

## 2013-12-14 NOTE — Progress Notes (Signed)
PHARMACIST - PHYSICIAN COMMUNICATION DR:   Johna Sheriff CONCERNING: Proton Pump Inhibitor IV to Oral Route Change Policy  The patient is receiving Protonix by the intravenous route. Based on criteria approved by the Pharmacy and Therapeutics Committee and the Medical Executive Committee, the medication is being converted to the equivalent oral dose form.   These criteria include:  -No Active GI bleeding  -Able to tolerate diet of full liquids (or better) or tube feeding  -Able to tolerate other medications by the oral or enteral route   If you have any questions about this conversion, please contact the Pharmacy Department (ext [phone]). Thank you.  Dorethea Clan, PharmD, BCPS

## 2013-12-14 NOTE — Progress Notes (Signed)
Patient ID: [name], female   DOB: 06/11/1946, 67 y.o.   MRN: [id number]    Subjective: More communicative today. She denies abdominal pain or nausea. Has been able to drink liquids without difficulty.  Objective: Vital signs in last 24 hours: Temp:  [97.5 F (36.4 C)-98.2 F (36.8 C)] 97.7 F (36.5 C) (12/21 7829) Pulse Rate:  [71-74] 71 (12/21 0638) Resp:  [18] 18 (12/21 5621) BP: (113-159)/(77-91) 113/77 mmHg (12/21 0638) SpO2:  [97 %-98 %] 97 % (12/21 3086) Weight:  [203 lb 12.8 oz (92.443 kg)] 203 lb 12.8 oz (92.443 kg) 21-Dec-2023 1358) Last BM Date: 2013/12/20  Intake/Output from previous day: 21-Dec-2023 0701 - 12/21 0700 In: 3690 [P.O.:1080; I.V.:2400; IV Piggyback:210] Out: 2800 [Urine:2800] Intake/Output this shift:    General appearance: alert, cooperative and no distress GI: normal findings: soft, non-tender  Lab Results:   Recent Labs  12/12/13 0435  WBC 9.9  HGB 12.5  HCT 37.1  PLT 273   BMET  Recent Labs  12/12/13 0435  NA 141  K 3.6  CL 108  CO2 25  GLUCOSE 124*  BUN 8  CREATININE 0.69  CALCIUM 8.7     Studies/Results: Dg Abd 2 Views  2013/12/20   CLINICAL DATA:  Abdominal pain and distention, recent small bowel obstruction.  EXAM: ABDOMEN - 2 VIEW  COMPARISON:  December 12, 2013  FINDINGS: On today's study the degree of distention of small bowel loops has decreased. There there are a few loops of mildly distended small bowel in the mid abdomen. There is contrast and gas within normal calibered colonic loops. There is a small amount of stool and gas in the rectum. The nasogastric tube tip and proximal port lie below the level of the GE junction and stomach does not appear distended. There are surgical clips in the gallbladder fossa.  IMPRESSION: The findings are consistent with improvement in the small bowel obstruction. Only mild gaseous distention of a few small bowel loops is demonstrated today.   Electronically Signed   By: David  [place]   On:  December 20, 2013 08:29   Dg Abd 2 Views  12/12/2013   CLINICAL DATA:  Abdominal pain.  Small-bowel obstruction.  EXAM: ABDOMEN - 2 VIEW  COMPARISON:  None.  FINDINGS: NG tube is noted in stable position stomach. Surgical clips right upper quadrant consistent with prior cholecystectomy. Surgical sutures in the pelvis. Persistent severely distended loops of small bowel are noted. This is consistent small bowel obstruction. Stool is present in the colon. No free air is identified.  IMPRESSION: Persistent severely distended loops of small bowel consistent with small bowel obstruction. No free air. NG tube in stable position projected over stomach.   Electronically Signed   By: Maisie Fus  Register   On: 12/12/2013 10:26    Anti-infectives: Anti-infectives   None      Assessment/Plan: Small bowel obstruction appears resolved. Advance diet. Should be ready for discharge tomorrow.     LOS: 4 days    Jehan Bonano T 12/14/2013

## 2013-12-14 NOTE — Progress Notes (Signed)
Dr. Johna Sheriff aware via phone pt's IV infiltrated earlier in shift. Pt is difficult stick. MD also made aware Dr. Rito Ehrlich dc'd IV Keppra and restarted pt's po seizure medications. Pt tolerating diet as well as po meds. See new order to leave IV out.

## 2013-12-14 NOTE — Progress Notes (Signed)
[facility] PROGRESS NOTE  [name] XLK:[id number] DOB: January 04, 1946 DOA: 12/10/2013  PCP: Unknown  Brief HPI: Patient has a history of seizures, hyperlipidemia, mental retardation who lives at group home and was brought to the ER as patient was having abdominal pain with nausea vomiting. In the ER CT abdomen and pelvis showed distal bowel obstruction probably from adhesions. Patient was admitted for small bowel obstruction and hospitalist was consulted for medical management.   Past medical history:  Past Medical History  Diagnosis Date  . Hypercholesteremia   . Constipation   . Menopause   . Osteoporosis   . Seizure   . Edema     Primary Service: Gen Surgery  Procedures: None  Antibiotics: None  Subjective: Patient with mental retardation and so difficult to elicit history from her. But she is more communicative today. States she is better. Denies any pain or nausea and vomiting. Passing flatus. No seizures.  Objective: Vital Signs  Filed Vitals:   2013-12-26 1358 2013/12/26 1400 12-26-2013 2150 12/14/13 0638  BP:  159/79 148/91 113/77  Pulse:  73 74 71  Temp:  97.5 F (36.4 C) 98.2 F (36.8 C) 97.7 F (36.5 C)  TempSrc:  Oral Oral Oral  Resp:  18 18 18   Height: 5\' 7"  (1.702 m)     Weight: 92.443 kg (203 lb 12.8 oz)     SpO2:  98% 97% 97%    Intake/Output Summary (Last 24 hours) at 12/14/13 0933 Last data filed at 12/14/13 3664  Gross per 24 hour  Intake   3690 ml  Output   3600 ml  Net     90 ml   General appearance: alert, appears stated age, distracted and moderately obese Resp: clear to auscultation bilaterally Cardio: regular rate and rhythm, S1, S2 normal, no murmur, click, rub or gallop GI: Soft, Non tender. BS heard today. No masses or organomegaly. Extremities: extremities normal, atraumatic, no cyanosis or edema Neurologic: No focal deficits  Lab Results:  Basic Metabolic Panel:  Recent Labs Lab 12/10/13 1835 12/11/13 0525  12/12/13 0435  NA 139 140 141  K 3.5 3.6 3.6  CL 105 102 108  CO2 24 30 25   GLUCOSE 169* 146* 124*  BUN 15 11 8   CREATININE 0.66 0.77 0.69  CALCIUM 9.4 9.2 8.7   Liver Function Tests:  Recent Labs Lab 12/10/13 1835  AST 19  ALT 15  ALKPHOS 52  BILITOT 0.3  PROT 7.3  ALBUMIN 3.4*    Recent Labs Lab 12/10/13 1835  LIPASE 12   CBC:  Recent Labs Lab 12/10/13 1835 12/11/13 0525 12/12/13 0435  WBC 11.1* 9.4 9.9  NEUTROABS 9.2*  --   --   HGB 13.8 12.3 12.5  HCT 40.1 37.1 37.1  MCV 90.9 92.1 92.3  PLT 315 294 273   CBG:  Recent Labs Lab 2013/12/26 1607 12-26-13 1947 2013-12-26 2338 12/14/13 0358 12/14/13 0744  GLUCAP 94 154* 127* 169* 105*    No results found for this or any previous visit (from the past 240 hour(s)).    Studies/Results: Dg Abd 2 Views  2013-12-26   CLINICAL DATA:  Abdominal pain and distention, recent small bowel obstruction.  EXAM: ABDOMEN - 2 VIEW  COMPARISON:  December 12, 2013  FINDINGS: On today's study the degree of distention of small bowel loops has decreased. There there are a few loops of mildly distended small bowel in the mid abdomen. There is contrast and gas within normal calibered colonic loops. There  is a small amount of stool and gas in the rectum. The nasogastric tube tip and proximal port lie below the level of the GE junction and stomach does not appear distended. There are surgical clips in the gallbladder fossa.  IMPRESSION: The findings are consistent with improvement in the small bowel obstruction. Only mild gaseous distention of a few small bowel loops is demonstrated today.   Electronically Signed   By: David  [place]   On: 12/13/2013 08:29   Dg Abd 2 Views  12/12/2013   CLINICAL DATA:  Abdominal pain.  Small-bowel obstruction.  EXAM: ABDOMEN - 2 VIEW  COMPARISON:  None.  FINDINGS: NG tube is noted in stable position stomach. Surgical clips right upper quadrant consistent with prior cholecystectomy. Surgical sutures in  the pelvis. Persistent severely distended loops of small bowel are noted. This is consistent small bowel obstruction. Stool is present in the colon. No free air is identified.  IMPRESSION: Persistent severely distended loops of small bowel consistent with small bowel obstruction. No free air. NG tube in stable position projected over stomach.   Electronically Signed   By: Maisie Fus  Register   On: 12/12/2013 10:26    Medications:  Scheduled: . atorvastatin  10 mg Oral QHS  . felbamate  600 mg Oral BID  . levETIRAcetam  500 mg Intravenous Q12H  . pantoprazole (PROTONIX) IV  40 mg Intravenous QHS   Continuous: . dextrose 5 % and 0.9 % NaCl with KCl 20 mEq/L 50 mL/hr at 12/14/13 0745   ZOX:[id number], morphine injection, ondansetron  Assessment/Plan:  Active Problems:   CONVULSIONS, SEIZURES, NOS   SBO (small bowel obstruction)   Hyperglycemia   HLD (hyperlipidemia)    Small bowel obstruction Management per surgery. NG has been removed and patient is tolerating liquids. Diet advanced today.  History of seizures Patient is on felbamate at group home. Admitting MD discussed with on-call neurologist Dr. Cyril Mourning. Since patient was n.p.o it was recommended that she be placed on IV Keppra which is being continued. No seizures noted. Will resume Felbamate today. If she tolerates we can discontinue Keppra.  Elevated BP BP is better. No known history of HTN. On hydralazine IV as needed. Noted to be on Lasix at group home. Will assess need for antihypertensives once she is improved. Will likely not need anything other than Lasix.  History of OSA No CPAP for now due to SBO and gastric distension noted on CT. Can resume at group home.  Leukocytosis WBC continues to be normal. Atelectasis noted on CXR. UA was abnormal but not suggestive of infection.  Mild hyperglycemia On SSI. HBA1C is 5.4.   History of hyperlipidemia Resume statin   Code Status: Full Code  DVT Prophylaxis: SCD's      [facility] will continue to follow along.   LOS: 4 days   [facility]  [facility] Pager [phone] 12/14/2013, 9:33 AM  If 8PM-8AM, please contact night-coverage at [email or web address], password [facility]

## 2013-12-15 NOTE — Discharge Summary (Signed)
Patient ID: [name] MRN: [id number] DOB/AGE: 1946-04-18 67 y.o.  Admit date: 12/10/2013 Discharge date: 12/15/2013  Procedures: none  Consults: internal medicine  Reason for Admission: The pt is a 67 yo wf who lives in a group home secondary to some element of mental retardation. She was in her usual state of health until Monday when she developed nausea and vomiting. She had none on Tuesday and then started again today. She denies abdominal pain. She denies previous surgery but she does not have a gallbladder and she has a RUQ transverse scar.   Admission Diagnoses:  1. SBO 2. MR 3. H/o seizures  Hospital Course: the patient was admitted and had an NGT placed for conservative management.  The first 2 days the patient had minimal to no change in her abdominal exam or x-rays; however, on HD3 the patient had started to have multiple BMs.  Her NGT was able to be dc and she was started on clear liquids.  Her diet was then able to be advanced as tolerated.  She was tolerating a low fiber diet on HD 5 and stable for dc home.  Internal medicine was asked to assist [place] in managing her seizure medications.  She was stable throughout her stay.  No changes were made to this regimen.  PE: Abd: soft, NT, ND, +BS Heart: regular Lungs: CTAB  Discharge Diagnoses:  Active Problems:   CONVULSIONS, SEIZURES, NOS   SBO (small bowel obstruction), resolved   Hyperglycemia   HLD (hyperlipidemia)   Discharge Medications:   Medication List         atorvastatin 10 MG tablet  Commonly known as:  LIPITOR  Take 10 mg by mouth at bedtime.     calcium-vitamin D 500-200 MG-UNIT per tablet  Commonly known as:  OSCAL WITH D  Take 1 tablet by mouth 2 (two) times daily.     felbamate 600 MG tablet  Commonly known as:  FELBATOL  Take 600 mg by mouth 2 (two) times daily.     fenofibrate 145 MG tablet  Commonly known as:  TRICOR  Take 145 mg by mouth every morning.     furosemide 20 MG tablet   Commonly known as:  LASIX  Take 20 mg by mouth every morning.     lactulose 10 G packet  Commonly known as:  CEPHULAC  Take 10 g by mouth 2 (two) times daily.     oxybutynin 5 MG tablet  Commonly known as:  DITROPAN  Take 5 mg by mouth 3 (three) times daily.     potassium chloride SA 20 MEQ tablet  Commonly known as:  K-DUR,KLOR-CON  Take 20 mEq by mouth daily.        Discharge Instructions:     Follow-up Information   Follow up with [facility]. (As needed)    Contact information:   [address] [place] [address]       Signed: Letha Cape 12/15/2013, 11:44 AM  Agree with above. She became much more talkative toward the end of her hospitalization.  Ovidio Kin, MD, [facility] Pager: [phone] Office phone:  [phone]

## 2013-12-15 NOTE — Progress Notes (Signed)
CSW assisting with d/c planning. Lynnell Catalan from [facility] group home contacted . Pt can return to group home when stable for d/c. CSW will continue to follow to assist with d/c planning .  Cori Razor LCSW [phone]

## 2014-05-06 ENCOUNTER — Encounter (HOSPITAL_BASED_OUTPATIENT_CLINIC_OR_DEPARTMENT_OTHER): Payer: Self-pay | Admitting: Emergency Medicine

## 2014-05-06 ENCOUNTER — Emergency Department (HOSPITAL_BASED_OUTPATIENT_CLINIC_OR_DEPARTMENT_OTHER): Payer: Medicare Other

## 2014-05-06 ENCOUNTER — Emergency Department (HOSPITAL_BASED_OUTPATIENT_CLINIC_OR_DEPARTMENT_OTHER)
Admission: EM | Admit: 2014-05-06 | Discharge: 2014-05-06 | Disposition: A | Discharge status: Home / Self Care | Payer: Medicare Other | Attending: Emergency Medicine | Admitting: Emergency Medicine

## 2014-05-06 DIAGNOSIS — E7800 Pure hypercholesterolemia, unspecified: Secondary | ICD-10-CM | POA: Insufficient documentation

## 2014-05-06 DIAGNOSIS — M810 Age-related osteoporosis without current pathological fracture: Secondary | ICD-10-CM | POA: Insufficient documentation

## 2014-05-06 DIAGNOSIS — Y929 Unspecified place or not applicable: Secondary | ICD-10-CM | POA: Insufficient documentation

## 2014-05-06 DIAGNOSIS — IMO0002 Reserved for concepts with insufficient information to code with codable children: Secondary | ICD-10-CM | POA: Insufficient documentation

## 2014-05-06 DIAGNOSIS — W010XXA Fall on same level from slipping, tripping and stumbling without subsequent striking against object, initial encounter: Secondary | ICD-10-CM | POA: Insufficient documentation

## 2014-05-06 DIAGNOSIS — S0083XA Contusion of other part of head, initial encounter: Secondary | ICD-10-CM | POA: Insufficient documentation

## 2014-05-06 DIAGNOSIS — Y9389 Activity, other specified: Secondary | ICD-10-CM | POA: Insufficient documentation

## 2014-05-06 DIAGNOSIS — Z8742 Personal history of other diseases of the female genital tract: Secondary | ICD-10-CM | POA: Insufficient documentation

## 2014-05-06 DIAGNOSIS — S1093XA Contusion of unspecified part of neck, initial encounter: Principal | ICD-10-CM

## 2014-05-06 DIAGNOSIS — G40909 Epilepsy, unspecified, not intractable, without status epilepticus: Secondary | ICD-10-CM | POA: Insufficient documentation

## 2014-05-06 DIAGNOSIS — Z79899 Other long term (current) drug therapy: Secondary | ICD-10-CM | POA: Insufficient documentation

## 2014-05-06 DIAGNOSIS — S0003XA Contusion of scalp, initial encounter: Secondary | ICD-10-CM | POA: Insufficient documentation

## 2014-05-06 DIAGNOSIS — S0010XA Contusion of unspecified eyelid and periocular area, initial encounter: Secondary | ICD-10-CM | POA: Insufficient documentation

## 2014-05-06 NOTE — ED Notes (Signed)
Patient transported to CT 

## 2014-05-06 NOTE — ED Notes (Signed)
Pt is from group home sent here for eval head injury x 1 hr ago hitting head on metal pole

## 2014-05-06 NOTE — ED Provider Notes (Signed)
Medical screening examination/treatment/procedure(s) were performed by non-physician practitioner and as supervising physician I was immediately available for consultation/collaboration.   EKG Interpretation None        Draeden Kellman B. Kveon Casanas, MD 05/06/14 2252 

## 2014-05-06 NOTE — ED Provider Notes (Signed)
CSN: [id number]     Arrival date & time 05/06/14  1531 History   First MD Initiated Contact with Patient 05/06/14 1549     Chief Complaint  Patient presents with  . Head Injury     (Consider location/radiation/quality/duration/timing/severity/associated sxs/prior Treatment) Patient is a 67 y.o. female presenting with head injury. The history is provided by the patient and a caregiver. No language interpreter was used.  Head Injury Location:  Frontal Time since incident:  2 hours Mechanism of injury comment:  She slipped while carrying a heavy object and hit her head on a metal pole.  Associated symptoms: no headaches, no loss of consciousness, no nausea, no neck pain and no vomiting     Past Medical History  Diagnosis Date  . Hypercholesteremia   . Constipation   . Menopause   . Osteoporosis   . Seizure   . Edema    History reviewed. No pertinent past surgical history. History reviewed. No pertinent family history. History  Substance Use Topics  . Smoking status: Never Smoker   . Smokeless tobacco: Not on file  . Alcohol Use: No   OB History   Grav Para Term Preterm Abortions TAB SAB Ect Mult Living                 Review of Systems  Constitutional: Negative for fever.  Respiratory: Negative.   Cardiovascular: Negative.   Gastrointestinal: Negative.  Negative for nausea and vomiting.  Musculoskeletal: Negative.  Negative for neck pain.  Skin: Positive for color change.  Neurological: Negative for loss of consciousness and headaches.      Allergies  Review of patient's allergies indicates no known allergies.  Home Medications   Prior to Admission medications   Medication Sig Start Date End Date Taking? Authorizing Provider  LORazepam (ATIVAN) 1 MG tablet Take 1 mg by mouth every 8 (eight) hours.   Yes Historical Provider, MD  atorvastatin (LIPITOR) 10 MG tablet Take 10 mg by mouth at bedtime.     Historical Provider, MD  calcium-vitamin D (OSCAL WITH D)  500-200 MG-UNIT per tablet Take 1 tablet by mouth 2 (two) times daily.     Historical Provider, MD  felbamate (FELBATOL) 600 MG tablet Take 600 mg by mouth 2 (two) times daily.     Historical Provider, MD  fenofibrate (TRICOR) 145 MG tablet Take 145 mg by mouth every morning.     Historical Provider, MD  furosemide (LASIX) 20 MG tablet Take 20 mg by mouth every morning.     Historical Provider, MD  lactulose (CEPHULAC) 10 G packet Take 10 g by mouth 2 (two) times daily.      Historical Provider, MD  oxybutynin (DITROPAN) 5 MG tablet Take 5 mg by mouth 3 (three) times daily.      Historical Provider, MD  potassium chloride SA (K-DUR,KLOR-CON) 20 MEQ tablet Take 20 mEq by mouth daily.     Historical Provider, MD   BP 159/62  Pulse 72  Temp(Src) 98 F (36.7 C) (Oral)  Resp 16  Ht 5\' 5"  (1.651 m)  Wt 201 lb 3.2 oz (91.264 kg)  BMI 33.48 kg/m2  SpO2 99% Physical Exam  Constitutional: She is oriented to person, place, and time. She appears well-developed and well-nourished. No distress.  HENT:  Forehead and right eye lid ecchymosis and mild edema. Non-tender bruising.   Eyes: Conjunctivae and EOM are normal. Pupils are equal, round, and reactive to light.  FROM without pain on movement.  Neck: Normal range of motion.  Pulmonary/Chest: Effort normal.  Abdominal: There is no tenderness.  Musculoskeletal:  No midline cervical tenderness.   Neurological: She is alert and oriented to person, place, and time. Coordination normal.  Speech is clear and focused. Alert, at baseline mental status (mild delay). CN's 3-12 grossly intact.   Skin:  See HENT.  Psychiatric: She has a normal mood and affect.    ED Course  Procedures (including critical care time) Labs Review Labs Reviewed - No data to display  Imaging Review No results found.   EKG Interpretation None      MDM   Final diagnoses:  None    1. Facial contusion  She is at baseline mental status per caregiver at bedside.  She has a minor head/facial injury after losing her footing (mechanical). CT done at the request of medical provider at [facility], which is negative. Stable for discharge.     Dewaine Oats, PA-C 05/06/14 1807

## 2014-05-06 NOTE — Discharge Instructions (Signed)
Contusion °A contusion is a deep bruise. Contusions are the result of an injury that caused bleeding under the skin. The contusion may turn blue, purple, or yellow. Minor injuries will give you a painless contusion, but more severe contusions may stay painful and swollen for a few weeks.  °CAUSES  °A contusion is usually caused by a blow, trauma, or direct force to an area of the body. °SYMPTOMS  °· Swelling and redness of the injured area. °· Bruising of the injured area. °· Tenderness and soreness of the injured area. °· Pain. °DIAGNOSIS  °The diagnosis can be made by taking a history and physical exam. An X-ray, CT scan, or MRI may be needed to determine if there were any associated injuries, such as fractures. °TREATMENT  °Specific treatment will depend on what area of the body was injured. In general, the best treatment for a contusion is resting, icing, elevating, and applying cold compresses to the injured area. Over-the-counter medicines may also be recommended for pain control. Ask your caregiver what the best treatment is for your contusion. °HOME CARE INSTRUCTIONS  °· Put ice on the injured area. °· Put ice in a plastic bag. °· Place a towel between your skin and the bag. °· Leave the ice on for 15-20 minutes, 03-04 times a day. °· Only take over-the-counter or prescription medicines for pain, discomfort, or fever as directed by your caregiver. Your caregiver may recommend avoiding anti-inflammatory medicines (aspirin, ibuprofen, and naproxen) for 48 hours because these medicines may increase bruising. °· Rest the injured area. °· If possible, elevate the injured area to reduce swelling. °SEEK IMMEDIATE MEDICAL CARE IF:  °· You have increased bruising or swelling. °· You have pain that is getting worse. °· Your swelling or pain is not relieved with medicines. °MAKE SURE YOU:  °· Understand these instructions. °· Will watch your condition. °· Will get help right away if you are not doing well or get  worse. °Document Released: 09/20/2005 Document Revised: 03/04/2012 Document Reviewed: 10/16/2011 °[employer]® Patient Information ©2014 [employer]. ° °Cryotherapy °Cryotherapy means treatment with cold. Ice or gel packs can be used to reduce both pain and swelling. Ice is the most helpful within the first 24 to 48 hours after an injury or flareup from overusing a muscle or joint. Sprains, strains, spasms, burning pain, shooting pain, and aches can all be eased with ice. Ice can also be used when recovering from surgery. Ice is effective, has very few side effects, and is safe for most people to use. °PRECAUTIONS  °Ice is not a safe treatment option for people with: °· Raynaud's phenomenon. This is a condition affecting small blood vessels in the extremities. Exposure to cold may cause your problems to return. °· Cold hypersensitivity. There are many forms of cold hypersensitivity, including: °· Cold urticaria. Red, itchy hives appear on the skin when the tissues begin to warm after being iced. °· Cold erythema. This is a red, itchy rash caused by exposure to cold. °· Cold hemoglobinuria. Red blood cells break down when the tissues begin to warm after being iced. The hemoglobin that carry oxygen are passed into the urine because they cannot combine with blood proteins fast enough. °· Numbness or altered sensitivity in the area being iced. °If you have any of the following conditions, do not use ice until you have discussed cryotherapy with your caregiver: °· Heart conditions, such as arrhythmia, angina, or chronic heart disease. °· High blood pressure. °· Healing wounds or open skin   in the area being iced. °· Current infections. °· Rheumatoid arthritis. °· Poor circulation. °· Diabetes. °Ice slows the blood flow in the region it is applied. This is beneficial when trying to stop inflamed tissues from spreading irritating chemicals to surrounding tissues. However, if you expose your skin to cold temperatures for too  long or without the proper protection, you can damage your skin or nerves. Watch for signs of skin damage due to cold. °HOME CARE INSTRUCTIONS °Follow these tips to use ice and cold packs safely. °· Place a dry or damp towel between the ice and skin. A damp towel will cool the skin more quickly, so you may need to shorten the time that the ice is used. °· For a more rapid response, add gentle compression to the ice. °· Ice for no more than 10 to 20 minutes at a time. The bonier the area you are icing, the less time it will take to get the benefits of ice. °· Check your skin after 5 minutes to make sure there are no signs of a poor response to cold or skin damage. °· Rest 20 minutes or more in between uses. °· Once your skin is numb, you can end your treatment. You can test numbness by very lightly touching your skin. The touch should be so light that you do not see the skin dimple from the pressure of your fingertip. When using ice, most people will feel these normal sensations in this order: cold, burning, aching, and numbness. °· Do not use ice on someone who cannot communicate their responses to pain, such as small children or people with dementia. °HOW TO MAKE AN ICE PACK °Ice packs are the most common way to use ice therapy. Other methods include ice massage, ice baths, and cryo-sprays. Muscle creams that cause a cold, tingly feeling do not offer the same benefits that ice offers and should not be used as a substitute unless recommended by your caregiver. °To make an ice pack, do one of the following: °· Place crushed ice or a bag of frozen vegetables in a sealable plastic bag. Squeeze out the excess air. Place this bag inside another plastic bag. Slide the bag into a pillowcase or place a damp towel between your skin and the bag. °· Mix 3 parts water with 1 part rubbing alcohol. Freeze the mixture in a sealable plastic bag. When you remove the mixture from the freezer, it will be slushy. Squeeze out the excess  air. Place this bag inside another plastic bag. Slide the bag into a pillowcase or place a damp towel between your skin and the bag. °SEEK MEDICAL CARE IF: °· You develop white spots on your skin. This may give the skin a blotchy (mottled) appearance. °· Your skin turns blue or pale. °· Your skin becomes waxy or hard. °· Your swelling gets worse. °MAKE SURE YOU:  °· Understand these instructions. °· Will watch your condition. °· Will get help right away if you are not doing well or get worse. °Document Released: 08/07/2011 Document Revised: 03/04/2012 Document Reviewed: 08/07/2011 °ExitCare® Patient Information ©2014 [employer]. ° °

## 2015-05-26 DIAGNOSIS — K6389 Other specified diseases of intestine: Secondary | ICD-10-CM

## 2015-05-26 HISTORY — DX: Other specified diseases of intestine: K63.89

## 2015-05-30 ENCOUNTER — Encounter (HOSPITAL_COMMUNITY): Payer: Self-pay | Admitting: Emergency Medicine

## 2015-05-30 ENCOUNTER — Emergency Department (HOSPITAL_COMMUNITY): Payer: Medicare Other

## 2015-05-30 ENCOUNTER — Inpatient Hospital Stay (HOSPITAL_COMMUNITY)
Admission: EM | Admit: 2015-05-30 | Discharge: 2015-06-14 | DRG: 329 | Disposition: A | Discharge status: Home / Self Care | Payer: Medicare Other | Attending: Internal Medicine | Admitting: Internal Medicine

## 2015-05-30 DIAGNOSIS — C786 Secondary malignant neoplasm of retroperitoneum and peritoneum: Secondary | ICD-10-CM | POA: Diagnosis present

## 2015-05-30 DIAGNOSIS — N390 Urinary tract infection, site not specified: Secondary | ICD-10-CM | POA: Diagnosis present

## 2015-05-30 DIAGNOSIS — I10 Essential (primary) hypertension: Secondary | ICD-10-CM | POA: Diagnosis present

## 2015-05-30 DIAGNOSIS — Z9049 Acquired absence of other specified parts of digestive tract: Secondary | ICD-10-CM | POA: Diagnosis present

## 2015-05-30 DIAGNOSIS — E780 Pure hypercholesterolemia: Secondary | ICD-10-CM | POA: Diagnosis present

## 2015-05-30 DIAGNOSIS — E785 Hyperlipidemia, unspecified: Secondary | ICD-10-CM | POA: Diagnosis present

## 2015-05-30 DIAGNOSIS — K56609 Unspecified intestinal obstruction, unspecified as to partial versus complete obstruction: Secondary | ICD-10-CM

## 2015-05-30 DIAGNOSIS — K565 Intestinal adhesions [bands] with obstruction (postprocedural) (postinfection): Secondary | ICD-10-CM | POA: Diagnosis present

## 2015-05-30 DIAGNOSIS — M810 Age-related osteoporosis without current pathological fracture: Secondary | ICD-10-CM | POA: Diagnosis present

## 2015-05-30 DIAGNOSIS — D630 Anemia in neoplastic disease: Secondary | ICD-10-CM | POA: Diagnosis present

## 2015-05-30 DIAGNOSIS — Z90710 Acquired absence of both cervix and uterus: Secondary | ICD-10-CM

## 2015-05-30 DIAGNOSIS — F71 Moderate intellectual disabilities: Secondary | ICD-10-CM | POA: Diagnosis present

## 2015-05-30 DIAGNOSIS — R112 Nausea with vomiting, unspecified: Secondary | ICD-10-CM | POA: Diagnosis present

## 2015-05-30 DIAGNOSIS — E43 Unspecified severe protein-calorie malnutrition: Secondary | ICD-10-CM | POA: Diagnosis present

## 2015-05-30 DIAGNOSIS — K6389 Other specified diseases of intestine: Secondary | ICD-10-CM | POA: Diagnosis present

## 2015-05-30 DIAGNOSIS — E876 Hypokalemia: Secondary | ICD-10-CM | POA: Diagnosis not present

## 2015-05-30 DIAGNOSIS — R569 Unspecified convulsions: Secondary | ICD-10-CM | POA: Diagnosis present

## 2015-05-30 DIAGNOSIS — Z95828 Presence of other vascular implants and grafts: Secondary | ICD-10-CM

## 2015-05-30 DIAGNOSIS — E669 Obesity, unspecified: Secondary | ICD-10-CM | POA: Diagnosis present

## 2015-05-30 DIAGNOSIS — F79 Unspecified intellectual disabilities: Secondary | ICD-10-CM

## 2015-05-30 DIAGNOSIS — C182 Malignant neoplasm of ascending colon: Secondary | ICD-10-CM | POA: Diagnosis present

## 2015-05-30 DIAGNOSIS — C189 Malignant neoplasm of colon, unspecified: Secondary | ICD-10-CM | POA: Diagnosis present

## 2015-05-30 DIAGNOSIS — R000 Tachycardia, unspecified: Secondary | ICD-10-CM | POA: Diagnosis present

## 2015-05-30 DIAGNOSIS — K5660 Unspecified intestinal obstruction: Secondary | ICD-10-CM | POA: Diagnosis present

## 2015-05-30 DIAGNOSIS — R109 Unspecified abdominal pain: Secondary | ICD-10-CM | POA: Diagnosis not present

## 2015-05-30 DIAGNOSIS — Z801 Family history of malignant neoplasm of trachea, bronchus and lung: Secondary | ICD-10-CM

## 2015-05-30 DIAGNOSIS — Z6834 Body mass index (BMI) 34.0-34.9, adult: Secondary | ICD-10-CM

## 2015-05-30 DIAGNOSIS — K5669 Other intestinal obstruction: Secondary | ICD-10-CM | POA: Diagnosis not present

## 2015-05-30 HISTORY — DX: Other specified diseases of intestine: K63.89

## 2015-05-30 HISTORY — DX: Unspecified intellectual disabilities: F79

## 2015-05-30 HISTORY — DX: Unspecified intestinal obstruction, unspecified as to partial versus complete obstruction: K56.609

## 2015-05-30 LAB — LIPASE, BLOOD: Lipase: 11 U/L — ABNORMAL LOW (ref 22–51)

## 2015-05-30 LAB — COMPREHENSIVE METABOLIC PANEL
ALK PHOS: 64 U/L (ref 38–126)
ALT: 17 U/L (ref 14–54)
AST: 30 U/L (ref 15–41)
Albumin: 3.4 g/dL — ABNORMAL LOW (ref 3.5–5.0)
Anion gap: 10 (ref 5–15)
BUN: 23 mg/dL — ABNORMAL HIGH (ref 6–20)
CHLORIDE: 107 mmol/L (ref 101–111)
CO2: 24 mmol/L (ref 22–32)
Calcium: 9.3 mg/dL (ref 8.9–10.3)
Creatinine, Ser: 0.73 mg/dL (ref 0.44–1.00)
Glucose, Bld: 152 mg/dL — ABNORMAL HIGH (ref 65–99)
Potassium: 4 mmol/L (ref 3.5–5.1)
Sodium: 141 mmol/L (ref 135–145)
TOTAL PROTEIN: 7.6 g/dL (ref 6.5–8.1)

## 2015-05-30 LAB — CBC WITH DIFFERENTIAL/PLATELET
Basophils Absolute: 0 10*3/uL (ref 0.0–0.1)
Basophils Relative: 0 % (ref 0–1)
EOS ABS: 0 10*3/uL (ref 0.0–0.7)
Eosinophils Relative: 0 % (ref 0–5)
HCT: 36.4 % (ref 36.0–46.0)
Hemoglobin: 11.3 g/dL — ABNORMAL LOW (ref 12.0–15.0)
LYMPHS PCT: 7 % — AB (ref 12–46)
Lymphs Abs: 1 10*3/uL (ref 0.7–4.0)
MCH: 27.3 pg (ref 26.0–34.0)
MCHC: 31 g/dL (ref 30.0–36.0)
MCV: 87.9 fL (ref 78.0–100.0)
MONO ABS: 0.9 10*3/uL (ref 0.1–1.0)
MONOS PCT: 6 % (ref 3–12)
NEUTROS PCT: 87 % — AB (ref 43–77)
Neutro Abs: 12.3 10*3/uL — ABNORMAL HIGH (ref 1.7–7.7)
Platelets: 476 10*3/uL — ABNORMAL HIGH (ref 150–400)
RBC: 4.14 MIL/uL (ref 3.87–5.11)
RDW: 13.6 % (ref 11.5–15.5)
WBC: 14.2 10*3/uL — ABNORMAL HIGH (ref 4.0–10.5)

## 2015-05-30 LAB — URINALYSIS, ROUTINE W REFLEX MICROSCOPIC
Glucose, UA: NEGATIVE mg/dL
HGB URINE DIPSTICK: NEGATIVE
KETONES UR: NEGATIVE mg/dL
Nitrite: NEGATIVE
PH: 5.5 (ref 5.0–8.0)
PROTEIN: NEGATIVE mg/dL
Specific Gravity, Urine: 1.043 — ABNORMAL HIGH (ref 1.005–1.030)
UROBILINOGEN UA: 1 mg/dL (ref 0.0–1.0)

## 2015-05-30 LAB — URINE MICROSCOPIC-ADD ON

## 2015-05-30 LAB — I-STAT TROPONIN, ED: Troponin i, poc: 0.01 ng/mL (ref 0.00–0.08)

## 2015-05-30 MED ORDER — SODIUM CHLORIDE 0.9 % IV SOLN: INTRAVENOUS | Status: AC

## 2015-05-30 MED ORDER — MORPHINE SULFATE 2 MG/ML IJ SOLN
1.0000 mg | INTRAMUSCULAR | Status: AC | PRN
  Administered 2015-05-30 – 2015-06-02 (×2): 1 mg via INTRAVENOUS
  Filled 2015-05-30 (×2): qty 1

## 2015-05-30 MED ORDER — ONDANSETRON HCL 4 MG/2ML IJ SOLN
4.0000 mg | Freq: Once | INTRAMUSCULAR | Status: AC
  Administered 2015-05-30: 4 mg via INTRAVENOUS
  Filled 2015-05-30: qty 2

## 2015-05-30 MED ORDER — IOHEXOL 300 MG/ML  SOLN
50.0000 mL | Freq: Once | INTRAMUSCULAR | Status: AC | PRN
  Administered 2015-05-30: 50 mL via ORAL

## 2015-05-30 MED ORDER — ONDANSETRON HCL 4 MG/2ML IJ SOLN: 4.0000 mg | INTRAMUSCULAR | Status: DC | PRN

## 2015-05-30 MED ORDER — IOHEXOL 300 MG/ML  SOLN
100.0000 mL | Freq: Once | INTRAMUSCULAR | Status: AC | PRN
  Administered 2015-05-30: 100 mL via INTRAVENOUS

## 2015-05-30 MED ORDER — SODIUM CHLORIDE 0.9 % IV SOLN
INTRAVENOUS | Status: DC
  Administered 2015-05-30: 75 mL/h via INTRAVENOUS

## 2015-05-30 NOTE — Progress Notes (Signed)
I have received request fro Dr Tana Coast to evaluate this pt with right colon mass and a SBO. Shd will be decompressed ovrenight and we will see her tomorrow . Will need a t least a limited prep in order to proceed with colonoscopy

## 2015-05-30 NOTE — ED Notes (Signed)
Patient transported to CT 

## 2015-05-30 NOTE — ED Provider Notes (Signed)
CSN: [id number]     Arrival date & time 05/30/15  1135 History   First MD Initiated Contact with Patient 05/30/15 1231     Chief Complaint  Patient presents with  . Diarrhea  . Abdominal Pain  . Emesis     HPI Pt was seen at 1325.  Per pt, c/o gradual onset and persistence of constant generalized abd "pain" since this morning.  Has been associated with multiple intermittent episodes of N/V as well as "loose stools" since last night.  Describes the abd pain as "aching."  Denies fevers, no back pain, no rash, no CP/SOB, no black or blood in stools or emesis.       Past Medical History  Diagnosis Date  . Hypercholesteremia   . Constipation   . Menopause   . Osteoporosis   . Seizure   . Edema    History reviewed. No pertinent past surgical history.  History  Substance Use Topics  . Smoking status: Never Smoker   . Smokeless tobacco: Not on file  . Alcohol Use: No    Review of Systems ROS: Statement: All systems negative except as marked or noted in the HPI; Constitutional: Negative for fever and chills. ; ; Eyes: Negative for eye pain, redness and discharge. ; ; ENMT: Negative for ear pain, hoarseness, nasal congestion, sinus pressure and sore throat. ; ; Cardiovascular: Negative for chest pain, palpitations, diaphoresis, dyspnea and peripheral edema. ; ; Respiratory: Negative for cough, wheezing and stridor. ; ; Gastrointestinal: +N/V, loose stools, abd pain. Negative for blood in stool, hematemesis, jaundice and rectal bleeding. . ; ; Genitourinary: Negative for dysuria, flank pain and hematuria. ; ; Musculoskeletal: Negative for back pain and neck pain. Negative for swelling and trauma.; ; Skin: Negative for pruritus, rash, abrasions, blisters, bruising and skin lesion.; ; Neuro: Negative for headache, lightheadedness and neck stiffness. Negative for weakness, altered level of consciousness , altered mental status, extremity weakness, paresthesias, involuntary movement, seizure and  syncope.     Allergies  Review of patient's allergies indicates no known allergies.  Home Medications   Prior to Admission medications   Medication Sig Start Date End Date Taking? Authorizing Provider  atorvastatin (LIPITOR) 10 MG tablet Take 10 mg by mouth at bedtime.    Yes Historical Provider, MD  bisacodyl (DULCOLAX) 5 MG EC tablet Take 5 mg by mouth daily as needed for mild constipation or moderate constipation.   Yes Historical Provider, MD  calcium-vitamin D (OSCAL WITH D) 500-200 MG-UNIT per tablet Take 1 tablet by mouth 2 (two) times daily.    Yes Historical Provider, MD  docusate sodium (COLACE) 100 MG capsule Take 100 mg by mouth 2 (two) times daily.   Yes Historical Provider, MD  fenofibrate (TRICOR) 145 MG tablet Take 145 mg by mouth every morning.    Yes Historical Provider, MD  ferrous fumarate (HEMOCYTE - 106 MG FE) 325 (106 FE) MG TABS tablet Take 1 tablet by mouth 2 (two) times daily.   Yes Historical Provider, MD  furosemide (LASIX) 20 MG tablet Take 20 mg by mouth every morning.    Yes Historical Provider, MD  lactulose, encephalopathy, (CHRONULAC) 10 GM/15ML SOLN Take 10 g by mouth 2 (two) times daily as needed (constipation).   Yes Historical Provider, MD  metroNIDAZOLE (METROGEL) 1 % gel Apply 1 application topically daily. Started 6.1.16   Yes Historical Provider, MD  potassium chloride SA (K-DUR,KLOR-CON) 20 MEQ tablet Take 20 mEq by mouth daily.    Yes  Historical Provider, MD  Vitamin D, Ergocalciferol, (DRISDOL) 50000 UNITS CAPS capsule Take 50,000 Units by mouth every 7 (seven) days. Friday   Yes Historical Provider, MD  felbamate (FELBATOL) 600 MG tablet Take 1,200 mg by mouth 2 (two) times daily.     Historical Provider, MD   BP 137/80 mmHg  Pulse 137  Temp(Src) 98.3 F (36.8 C) (Oral)  Resp 20  SpO2 96%  Filed Vitals:   05/30/15 1146 05/30/15 1421  BP: 137/80 134/84  Pulse: 137 108  Temp: 98.3 F (36.8 C)   TempSrc: Oral   Resp: 20 23  SpO2: 96% 96%     Physical Exam  1330: Physical examination:  Nursing notes reviewed; Vital signs and O2 SAT reviewed;  Constitutional: Well developed, Well nourished, In no acute distress; Head:  Normocephalic, atraumatic; Eyes: EOMI, PERRL, No scleral icterus; ENMT: Mouth and pharynx normal, Mucous membranes dry; Neck: Supple, Full range of motion, No lymphadenopathy; Cardiovascular: Tachycardic rate and rhythm, No gallop; Respiratory: Breath sounds clear & equal bilaterally, No rales, rhonchi, wheezes.  Speaking full sentences with ease, Normal respiratory effort/excursion; Chest: Nontender, Movement normal; Abdomen: +diffuse tenderness to palp, esp LLQ. No rebound or guarding. +softly distended and tympanitic. Decreased bowel sounds; Genitourinary: No CVA tenderness; Extremities: Pulses normal, No tenderness, No edema, No calf edema or asymmetry.; Neuro: AA&Ox3, Major CN grossly intact.  Speech clear. No gross focal motor or sensory deficits in extremities.; Skin: Color normal, Warm, Dry.   ED Course  Procedures     EKG Interpretation   Date/Time:  Sunday May 30 2015 11:51:42 EDT Ventricular Rate:  134 PR Interval:    QRS Duration: 76 QT Interval:  341 QTC Calculation: 509 R Axis:   54 Text Interpretation:  Sinus tachycardia Nonspecific ST and T wave  abnormality Diffuse Prolonged QT interval Baseline wander When compared  with ECG of 09/10/2008 Rate faster and Nonspecific ST and T wave  abnormality is now Present Confirmed by [facility]  MD, Nunzio Cory [phone]) on  05/30/2015 2:11:34 PM      MDM  MDM Reviewed: previous chart, nursing note and vitals Reviewed previous: labs and ECG Interpretation: labs, ECG and x-ray      Results for orders placed or performed during the hospital encounter of 05/30/15  CBC with Differential  Result Value Ref Range   WBC 14.2 (H) 4.0 - 10.5 K/uL   RBC 4.14 3.87 - 5.11 MIL/uL   Hemoglobin 11.3 (L) 12.0 - 15.0 g/dL   HCT 36.4 36.0 - 46.0 %   MCV 87.9 78.0 -  100.0 fL   MCH 27.3 26.0 - 34.0 pg   MCHC 31.0 30.0 - 36.0 g/dL   RDW 13.6 11.5 - 15.5 %   Platelets 476 (H) 150 - 400 K/uL   Neutrophils Relative % 87 (H) 43 - 77 %   Neutro Abs 12.3 (H) 1.7 - 7.7 K/uL   Lymphocytes Relative 7 (L) 12 - 46 %   Lymphs Abs 1.0 0.7 - 4.0 K/uL   Monocytes Relative 6 3 - 12 %   Monocytes Absolute 0.9 0.1 - 1.0 K/uL   Eosinophils Relative 0 0 - 5 %   Eosinophils Absolute 0.0 0.0 - 0.7 K/uL   Basophils Relative 0 0 - 1 %   Basophils Absolute 0.0 0.0 - 0.1 K/uL  Comprehensive metabolic panel  Result Value Ref Range   Sodium 141 135 - 145 mmol/L   Potassium 4.0 3.5 - 5.1 mmol/L   Chloride 107 101 - 111  mmol/L   CO2 24 22 - 32 mmol/L   Glucose, Bld 152 (H) 65 - 99 mg/dL   BUN 23 (H) 6 - 20 mg/dL   Creatinine, Ser 0.73 0.44 - 1.00 mg/dL   Calcium 9.3 8.9 - 10.3 mg/dL   Total Protein 7.6 6.5 - 8.1 g/dL   Albumin 3.4 (L) 3.5 - 5.0 g/dL   AST 30 15 - 41 U/L   ALT 17 14 - 54 U/L   Alkaline Phosphatase 64 38 - 126 U/L   Total Bilirubin <0.1 (L) 0.3 - 1.2 mg/dL   GFR calc non Af Amer >60 >60 mL/min   GFR calc Af Amer >60 >60 mL/min   Anion gap 10 5 - 15  Lipase, blood  Result Value Ref Range   Lipase 11 (L) 22 - 51 U/L  Urinalysis, Routine w reflex microscopic  Result Value Ref Range   Color, Urine AMBER (A) YELLOW   APPearance CLOUDY (A) CLEAR   Specific Gravity, Urine 1.043 (H) 1.005 - 1.030   pH 5.5 5.0 - 8.0   Glucose, UA NEGATIVE NEGATIVE mg/dL   Hgb urine dipstick NEGATIVE NEGATIVE   Bilirubin Urine SMALL (A) NEGATIVE   Ketones, ur NEGATIVE NEGATIVE mg/dL   Protein, ur NEGATIVE NEGATIVE mg/dL   Urobilinogen, UA 1.0 0.0 - 1.0 mg/dL   Nitrite NEGATIVE NEGATIVE   Leukocytes, UA SMALL (A) NEGATIVE  Urine microscopic-add on  Result Value Ref Range   Squamous Epithelial / LPF MANY (A) RARE   WBC, UA 3-6 <3 WBC/hpf   Bacteria, UA MANY (A) RARE   Urine-Other MUCOUS PRESENT   I-stat troponin, ED (only if pt is 71 y.o. or older & pain is above  umbilicus)  not at [facility], [facility]  Result Value Ref Range   Troponin i, poc 0.01 0.00 - 0.08 ng/mL   Comment 3           Dg Chest 2 View 05/30/2015   CLINICAL DATA:  Nausea, vomiting, productive cough  EXAM: CHEST  2 VIEW  COMPARISON:  12/10/2013  FINDINGS: Stable cardiomegaly with mild vascular congestion and basilar atelectasis. No definite focal pneumonia, collapse or consolidation. No effusion or pneumothorax. Trachea midline. Degenerative changes of the spine with an associated scoliosis. Marked gastric distention noted with an large air-fluid level.  IMPRESSION: Cardiomegaly with vascular congestion and basilar atelectasis.  Marked gastric distention.   Electronically Signed   By: Jerilynn Mages.  Shick M.D.   On: 05/30/2015 14:32    1555:  CT A/P pending. Dispo based on results. Sign out to Dr. Stark Jock.   Francine Graven, DO 05/30/15 1555

## 2015-05-30 NOTE — ED Notes (Signed)
Emergency contact [phone], Claiborne Billings, RN on call for weekend.

## 2015-05-30 NOTE — H&P (Signed)
History and Physical        Hospital Admission Note Date: 05/30/2015  Patient name: [name] Medical record number: [id number] Date of birth: 01/07/1946 Age: 69 y.o. Gender: female  PCP: Pcp Not In System  Referring physician: Dr Stark Jock  Chief Complaint:  Abdominal pain with nausea and vomiting  HPI: Patient is a 69 year old female with hyperlipidemia, osteoporosis, prior history of small bowel obstruction, constipation was brought to ED due to complaints of abdominal pain with multiple episodes of nausea and vomiting. History and ER records, the caregiver at the bedside. Patient is able to provide only limited history due to her mental status, underlying MR. per the caregiver, patient started complaining of generalized abdominal pain since this morning. She had multiple episodes of nausea and vomiting at home and in the ER. She had also complained "loose stools" last night. Patient is unable to describe the abdominal pain, states that it was aching all over. Denied any fever, rashes, no chest pain or shortness of breath, hematochezia or melenic or hematemesis. EDP recommendations reviewed, CBC remarkable for leukocytosis 14.2 with left shift, BMET unremarkable. CT abdomen and pelvis showed recurrent high-grade mid to distal small bowel obstruction with transition point in the periumbilical region, infiltrating mass involving the right colon highly suspicious for colon cancer, at bedtime and prominent mesenteric lymph nodes and/or peritoneal nodules concerning for metastatic disease. No distant metastases identified.  Review of Systems:  Constitutional: Denies fever, chills, diaphoresis,+  poor appetite and fatigue.  HEENT: Denies photophobia, eye pain, redness, hearing loss, ear pain, congestion, sore throat, rhinorrhea, sneezing, mouth sores, trouble swallowing, neck pain, neck  stiffness and tinnitus.   Respiratory: Denies SOB, DOE, cough, chest tightness,  and wheezing.   Cardiovascular: Denies chest pain, palpitations and leg swelling.  Gastrointestinal: Please see history of present illness Genitourinary: Denies dysuria, urgency, frequency, hematuria, flank pain and difficulty urinating.  Musculoskeletal: Denies myalgias, back pain, joint swelling, arthralgias and gait problem.  Skin: Denies pallor, rash and wound.  Neurological: Denies dizziness, seizures, syncope, weakness, light-headedness, numbness and headaches.  Hematological: Denies adenopathy. Easy bruising, personal or family bleeding history  Psychiatric/Behavioral: Denies suicidal ideation, mood changes, confusion, nervousness, sleep disturbance and agitation  Past Medical History: Past Medical History  Diagnosis Date  . Hypercholesteremia   . Constipation   . Menopause   . Osteoporosis   . Seizure   . Edema     History reviewed. No pertinent past surgical history.  Medications: Prior to Admission medications   Medication Sig Start Date End Date Taking? Authorizing Provider  atorvastatin (LIPITOR) 10 MG tablet Take 10 mg by mouth at bedtime.    Yes Historical Provider, MD  bisacodyl (DULCOLAX) 5 MG EC tablet Take 5 mg by mouth daily as needed for mild constipation or moderate constipation.   Yes Historical Provider, MD  calcium-vitamin D (OSCAL WITH D) 500-200 MG-UNIT per tablet Take 1 tablet by mouth 2 (two) times daily.    Yes Historical Provider, MD  docusate sodium (COLACE) 100 MG capsule Take 100 mg by mouth 2 (two) times daily.   Yes Historical Provider, MD  fenofibrate (TRICOR) 145 MG tablet Take 145 mg by mouth  every morning.    Yes Historical Provider, MD  ferrous fumarate (HEMOCYTE - 106 MG FE) 325 (106 FE) MG TABS tablet Take 1 tablet by mouth 2 (two) times daily.   Yes Historical Provider, MD  furosemide (LASIX) 20 MG tablet Take 20 mg by mouth every morning.    Yes Historical  Provider, MD  lactulose, encephalopathy, (CHRONULAC) 10 GM/15ML SOLN Take 10 g by mouth 2 (two) times daily as needed (constipation).   Yes Historical Provider, MD  metroNIDAZOLE (METROGEL) 1 % gel Apply 1 application topically daily. Started 6.1.16   Yes Historical Provider, MD  potassium chloride SA (K-DUR,KLOR-CON) 20 MEQ tablet Take 20 mEq by mouth daily.    Yes Historical Provider, MD  Vitamin D, Ergocalciferol, (DRISDOL) 50000 UNITS CAPS capsule Take 50,000 Units by mouth every 7 (seven) days. Friday   Yes Historical Provider, MD  felbamate (FELBATOL) 600 MG tablet Take 1,200 mg by mouth 2 (two) times daily.     Historical Provider, MD    Allergies:  No Known Allergies  Social History: Per ED reports, reports that she has never smoked. She does not have any smokeless tobacco history on file. She reports that she does not drink alcohol or use illicit drugs. per the caregiver at the bedside, she lives at home and has caregivers through the agency  Family History: Unable to assess patient's family history from her mental status, has underlying mental retardation  Physical Exam: Blood pressure 124/70, pulse 101, temperature 98.3 F (36.8 C), temperature source Oral, resp. rate 18, SpO2 97 %. General: Alert, awake, oriented to self, in no acute distress. HEENT: normocephalic, atraumatic, anicteric sclera, pink conjunctiva, pupils equal and reactive to light and accomodation, oropharynx clear Neck: supple, no masses or lymphadenopathy, no goiter, no bruits  Heart: Regular rate and rhythm, without murmurs, rubs or gallops. Lungs: Clear to auscultation bilaterally, no wheezing, rales or rhonchi. Abdomen: Soft, minimal diffuse tenderness, hypoactive bowel sounds, no rebound or guarding Extremities: No clubbing, cyanosis or edema with positive pedal pulses. Neuro: Grossly intact, no focal neurological deficits, strength 5/5 upper and lower extremities bilaterally Psych: alert and oriented to  self, normal mood and affect Skin: no rashes or lesions, warm and dry   LABS on Admission:  Basic Metabolic Panel:  Recent Labs Lab 05/30/15 1211  NA 141  K 4.0  CL 107  CO2 24  GLUCOSE 152*  BUN 23*  CREATININE 0.73  CALCIUM 9.3   Liver Function Tests:  Recent Labs Lab 05/30/15 1211  AST 30  ALT 17  ALKPHOS 64  BILITOT <0.1*  PROT 7.6  ALBUMIN 3.4*    Recent Labs Lab 05/30/15 1211  LIPASE 11*   No results for input(s): AMMONIA in the last 168 hours. CBC:  Recent Labs Lab 05/30/15 1211  WBC 14.2*  NEUTROABS 12.3*  HGB 11.3*  HCT 36.4  MCV 87.9  PLT 476*   Cardiac Enzymes: No results for input(s): CKTOTAL, CKMB, CKMBINDEX, TROPONINI in the last 168 hours. BNP: Invalid input(s): POCBNP CBG: No results for input(s): GLUCAP in the last 168 hours.  Radiological Exams on Admission:  Dg Chest 2 View  05/30/2015   CLINICAL DATA:  Nausea, vomiting, productive cough  EXAM: CHEST  2 VIEW  COMPARISON:  12/10/2013  FINDINGS: Stable cardiomegaly with mild vascular congestion and basilar atelectasis. No definite focal pneumonia, collapse or consolidation. No effusion or pneumothorax. Trachea midline. Degenerative changes of the spine with an associated scoliosis. Marked gastric distention noted with an large  air-fluid level.  IMPRESSION: Cardiomegaly with vascular congestion and basilar atelectasis.  Marked gastric distention.   Electronically Signed   By: Jerilynn Mages.  Shick M.D.   On: 05/30/2015 14:32   Ct Abdomen Pelvis W Contrast  05/30/2015   CLINICAL DATA:  Nausea, vomiting and diarrhea since last night. Upper abdominal pain today. Initial encounter.  EXAM: CT ABDOMEN AND PELVIS WITH CONTRAST  TECHNIQUE: Multidetector CT imaging of the abdomen and pelvis was performed using the standard protocol following bolus administration of intravenous contrast.  CONTRAST:  11mL OMNIPAQUE IOHEXOL 300 MG/ML SOLN, 189mL OMNIPAQUE IOHEXOL 300 MG/ML SOLN  COMPARISON:  Radiographs 12/13/2013.   CT 12/10/2013.  FINDINGS: Lower chest: Mildly increased atelectasis at both lung bases. There is no pleural or pericardial effusion. A small hiatal hernia is noted.  Hepatobiliary: Intra and extrahepatic biliary dilatation is similar to the prior examination. The common bile duct measures up to 14 mm in diameter and appears dilated to the ampulla. The gallbladder is surgically absent. No focal hepatic abnormalities identified.  Pancreas: Unremarkable. No pancreatic ductal dilatation or surrounding inflammatory changes.  Spleen: Normal in size without focal abnormality.  Adrenals/Urinary Tract: Both adrenal glands appear normal.There are tiny low-density renal lesions bilaterally, likely cysts. No suspicious renal finding or hydronephrosis. There is no evidence of urinary tract calculus. The bladder is largely decompressed.  Stomach/Bowel: There is recurrent moderate distension of the stomach and proximal to mid small bowel. The distal small bowel is decompressed. Transition zone appears to be in the periumbilical region where there is mild twisting of the small bowel but no discrete volvulus or soft tissue mass.The colon is normal in caliber. There is stool throughout the colon. There is strong concern of an infiltrating mass involving the right colon, measuring up to 6.1 cm transverse on image 47. There are prominent adjacent lymph nodes or peritoneal nodules, described below. No evidence of bowel perforation.  Vascular/Lymphatic: Stable atherosclerosis of the aorta, its branches and the iliac arteries. The left renal vein is circumaortic. There are multiple prominent lymph nodes around the right colon, including a 12 mm nodule lateral to it on image 42. Prominent ileocecal mesenteric nodes measure up to 8 mm on image 51. There is a prominent central mesenteric node measuring 9 mm on image 53. There is a small amount of perihepatic and pelvic ascites. There is no generalized peritoneal nodularity.  Reproductive:  Stable uterine atrophy.  No adnexal mass.  Other: Stable postsurgical changes within the low anterior abdominal wall. No hernia identified.  Musculoskeletal: No acute or significant osseous findings. Moderate lumbar spondylosis appears unchanged.  IMPRESSION: 1. Recurrent high-grade mid to distal small bowel obstruction with transition point in the periumbilical region. 2. Strong concern of infiltrating mass involving the right colon, highly suspicious for colon cancer. There are adjacent prominent mesenteric lymph nodes and/or peritoneal nodules concerning for metastatic disease. No distant metastases identified. This colon mass does not appear to be the etiology for the bowel obstruction. 3. Small amount of free pelvic fluid without generalized peritoneal nodularity. 4. Generally stable biliary dilatation status post cholecystectomy.   Electronically Signed   By: Richardean Sale M.D.   On: 05/30/2015 16:54    *I have personally reviewed the images above*     Assessment/Plan Principal Problem:   High-grade SBO (small bowel obstruction):  Also new diagnosis of colon mass - Admit to telemetry, NPO, IV fluid hydration, NG tube to intermittent wall suction - Consulted general surgery by EDP Dr Stark Jock - I also  called patient's sister, Mrs. [name], and updated in detail, she is okay with NG tube and all further interventions needed including colonoscopy or surgery. I have also consulted gastroenterology, discussed with Dr. Olevia Perches in detail.    Active Problems:   Mental retardation -Per the caregiver, currently at her baseline mental status      HLD (hyperlipidemia) - Currently NPO   UTI - Not convinced with urine analysis, will repeat UA and urine culture, for now placed on IV Rocephin  DVT prophylaxis: Heparin subcutaneous  CODE STATUS: Discussed in detail with the patient's sister, Ms Gwinda [place], has power of attorney, requested for full code  Family Communication: Admission, patients  condition and plan of care including tests being ordered have been discussed with the patient and sister who indicates understanding and agree with the plan and Code Status  Disposition plan: Further plan will depend as patient's clinical course evolves and further radiologic and laboratory data become available.   Time Spent on Admission: 60 minutes   Remedy Corporan M.D. [facility] 05/30/2015, 5:20 PM Pager: [phone]  If 7PM-7AM, please contact night-coverage [email or web address] Password [email or web address]

## 2015-05-30 NOTE — ED Notes (Signed)
Pt c/o N/V/D since last night with Upper abdominal pain since this morning. Pt Denies chest pain, SOB. A&Ox4 and ambulatory. Denies urinary symptoms. Denies fever, chills.

## 2015-05-30 NOTE — ED Notes (Signed)
Attempted NG tube. Pt continues to pull head away while inserting in nare.  Pt pushing RN's hand away that contains NG tubing.   Pt refusing to allow me to attempt again at this time.

## 2015-05-30 NOTE — ED Provider Notes (Signed)
Care assumed from Dr. Thurnell Garbe at shift change awaiting results of a CT scan. The CT scan shows a high-grade small bowel obstruction and concerns for possible colon cancer. She will be admitted to the medicine service under the care of Dr. Tana Coast. Surgery will also be consulted to evaluate the patient.  Veryl Speak, MD 05/30/15 [phone]

## 2015-05-30 NOTE — ED Notes (Addendum)
Made Dr Stark Jock aware pt refusing NG tube at this time.  Dr Stated that pt can wait to get to the floor for the NG tube placement.

## 2015-05-31 ENCOUNTER — Encounter (HOSPITAL_COMMUNITY): Payer: Self-pay | Admitting: Surgery

## 2015-05-31 DIAGNOSIS — F79 Unspecified intellectual disabilities: Secondary | ICD-10-CM

## 2015-05-31 DIAGNOSIS — K5669 Other intestinal obstruction: Secondary | ICD-10-CM

## 2015-05-31 DIAGNOSIS — K6389 Other specified diseases of intestine: Secondary | ICD-10-CM

## 2015-05-31 LAB — BASIC METABOLIC PANEL
ANION GAP: 10 (ref 5–15)
BUN: 25 mg/dL — ABNORMAL HIGH (ref 6–20)
CO2: 26 mmol/L (ref 22–32)
Calcium: 8.8 mg/dL — ABNORMAL LOW (ref 8.9–10.3)
Chloride: 104 mmol/L (ref 101–111)
Creatinine, Ser: 0.65 mg/dL (ref 0.44–1.00)
GFR calc non Af Amer: >60 mL/min (ref >60)
GLUCOSE: 142 mg/dL — AB (ref 65–99)
Potassium: 4 mmol/L (ref 3.5–5.1)
Sodium: 140 mmol/L (ref 135–145)

## 2015-05-31 LAB — CBC
HCT: 32.5 % — ABNORMAL LOW (ref 36.0–46.0)
Hemoglobin: 10 g/dL — ABNORMAL LOW (ref 12.0–15.0)
MCH: 27.3 pg (ref 26.0–34.0)
MCHC: 30.8 g/dL (ref 30.0–36.0)
MCV: 88.8 fL (ref 78.0–100.0)
PLATELETS: 386 10*3/uL (ref 150–400)
RBC: 3.66 MIL/uL — AB (ref 3.87–5.11)
RDW: 13.7 % (ref 11.5–15.5)
WBC: 8.6 10*3/uL (ref 4.0–10.5)

## 2015-05-31 LAB — URINE CULTURE

## 2015-05-31 MED ORDER — SODIUM CHLORIDE 0.9 % IV SOLN
500.0000 mg | Freq: Two times a day (BID) | INTRAVENOUS | Status: DC
  Administered 2015-05-31 – 2015-06-01 (×4): 500 mg via INTRAVENOUS
  Filled 2015-05-31 (×5): qty 5

## 2015-05-31 MED ORDER — ACETAMINOPHEN 650 MG RE SUPP: 650.0000 mg | Freq: Four times a day (QID) | RECTAL | Status: DC | PRN

## 2015-05-31 MED ORDER — ACETAMINOPHEN 325 MG PO TABS
650.0000 mg | ORAL_TABLET | Freq: Four times a day (QID) | ORAL | Status: DC | PRN
  Administered 2015-06-07: 650 mg via ORAL
  Filled 2015-05-31: qty 2

## 2015-05-31 MED ORDER — SODIUM CHLORIDE 0.9 % IJ SOLN
3.0000 mL | Freq: Two times a day (BID) | INTRAMUSCULAR | Status: DC
  Administered 2015-05-31 – 2015-06-11 (×13): 3 mL via INTRAVENOUS

## 2015-05-31 MED ORDER — LORAZEPAM 2 MG/ML IJ SOLN
1.0000 mg | Freq: Once | INTRAMUSCULAR | Status: AC
  Administered 2015-05-31: 21:00:00 via INTRAVENOUS
  Filled 2015-05-31: qty 1

## 2015-05-31 MED ORDER — SODIUM CHLORIDE 0.9 % IV SOLN
INTRAVENOUS | Status: DC
  Administered 2015-05-31 – 2015-06-02 (×5): via INTRAVENOUS

## 2015-05-31 MED ORDER — HEPARIN SODIUM (PORCINE) 5000 UNIT/ML IJ SOLN
5000.0000 [IU] | Freq: Three times a day (TID) | INTRAMUSCULAR | Status: AC
  Administered 2015-05-31 – 2015-06-06 (×21): 5000 [IU] via SUBCUTANEOUS
  Filled 2015-05-31 (×24): qty 1

## 2015-05-31 MED ORDER — HYDROMORPHONE HCL 1 MG/ML IJ SOLN
1.0000 mg | INTRAMUSCULAR | Status: DC | PRN
  Administered 2015-06-03 – 2015-06-06 (×3): 1 mg via INTRAVENOUS
  Filled 2015-05-31 (×4): qty 1

## 2015-05-31 MED ORDER — CEFTRIAXONE SODIUM IN DEXTROSE 20 MG/ML IV SOLN
1.0000 g | INTRAVENOUS | Status: DC
  Administered 2015-05-31 – 2015-06-04 (×5): 1 g via INTRAVENOUS
  Filled 2015-05-31 (×5): qty 50

## 2015-05-31 MED ORDER — ONDANSETRON HCL 4 MG/2ML IJ SOLN
4.0000 mg | Freq: Four times a day (QID) | INTRAMUSCULAR | Status: DC | PRN
  Administered 2015-05-31 – 2015-06-07 (×3): 4 mg via INTRAVENOUS
  Filled 2015-05-31 (×2): qty 2

## 2015-05-31 NOTE — Consult Note (Signed)
General Surgery [facility]  Reason for Consult: colonic mass, SBO  Referring Physician: Dr. Tana Coast, [facility]; Dr. Olevia Perches, [facility]  [name] is an 69 y.o. female.  HPI: patient is a 69 yo WF admitted with abdominal pain, distension, and signs of small bowel obstruction.  CT scan shows mid small bowel obstruction, high grade, with an advanced right colon mass and probable metastatic disease.  GI to see today for possible colonoscopy for biopsy.  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  Had BM overnight and more comfortable this morning.  Previous surgery seems to include lower abd surgery of unknown type and cholecystectomy.  Past Medical History  Diagnosis Date  . Hypercholesteremia   . Constipation   . Menopause   . Osteoporosis   . Seizure   . Edema     History reviewed. No pertinent past surgical history.  History reviewed. No pertinent family history.  Social History:  reports that she has never smoked. She does not have any smokeless tobacco history on file. She reports that she does not drink alcohol or use illicit drugs.  Allergies: No Known Allergies  Medications: I have reviewed the patient's current medications.  Results for orders placed or performed during the hospital encounter of 05/30/15 (from the past 48 hour(s))  Urinalysis, Routine w reflex microscopic     Status: Abnormal   Collection Time: 05/30/15 11:35 AM  Result Value Ref Range   Color, Urine AMBER (A) YELLOW    Comment: BIOCHEMICALS MAY BE AFFECTED BY COLOR   APPearance CLOUDY (A) CLEAR   Specific Gravity, Urine 1.043 (H) 1.005 - 1.030   pH 5.5 5.0 - 8.0   Glucose, UA NEGATIVE NEGATIVE mg/dL   Hgb urine dipstick NEGATIVE NEGATIVE   Bilirubin Urine SMALL (A) NEGATIVE   Ketones, ur NEGATIVE NEGATIVE mg/dL   Protein, ur NEGATIVE NEGATIVE mg/dL   Urobilinogen, UA 1.0 0.0 - 1.0 mg/dL   Nitrite NEGATIVE NEGATIVE   Leukocytes, UA SMALL (A)  NEGATIVE  Urine microscopic-add on     Status: Abnormal   Collection Time: 05/30/15 11:35 AM  Result Value Ref Range   Squamous Epithelial / LPF MANY (A) RARE   WBC, UA 3-6 <3 WBC/hpf   Bacteria, UA MANY (A) RARE   Urine-Other MUCOUS PRESENT     Comment: AMORPHOUS URATES/PHOSPHATES  CBC with Differential     Status: Abnormal   Collection Time: 05/30/15 12:11 PM  Result Value Ref Range   WBC 14.2 (H) 4.0 - 10.5 K/uL   RBC 4.14 3.87 - 5.11 MIL/uL   Hemoglobin 11.3 (L) 12.0 - 15.0 g/dL   HCT 36.4 36.0 - 46.0 %   MCV 87.9 78.0 - 100.0 fL   MCH 27.3 26.0 - 34.0 pg   MCHC 31.0 30.0 - 36.0 g/dL   RDW 13.6 11.5 - 15.5 %   Platelets 476 (H) 150 - 400 K/uL   Neutrophils Relative % 87 (H) 43 - 77 %   Neutro Abs 12.3 (H) 1.7 - 7.7 K/uL   Lymphocytes Relative 7 (L) 12 - 46 %   Lymphs Abs 1.0 0.7 - 4.0 K/uL   Monocytes Relative 6 3 - 12 %   Monocytes Absolute 0.9 0.1 - 1.0 K/uL   Eosinophils Relative 0 0 - 5 %   Eosinophils Absolute 0.0 0.0 - 0.7 K/uL   Basophils Relative 0 0 - 1 %   Basophils Absolute 0.0 0.0 - 0.1 K/uL  Comprehensive metabolic  panel     Status: Abnormal   Collection Time: 05/30/15 12:11 PM  Result Value Ref Range   Sodium 141 135 - 145 mmol/L   Potassium 4.0 3.5 - 5.1 mmol/L   Chloride 107 101 - 111 mmol/L   CO2 24 22 - 32 mmol/L   Glucose, Bld 152 (H) 65 - 99 mg/dL   BUN 23 (H) 6 - 20 mg/dL   Creatinine, Ser 1.47 0.44 - 1.00 mg/dL   Calcium 9.3 8.9 - 88.0 mg/dL   Total Protein 7.6 6.5 - 8.1 g/dL   Albumin 3.4 (L) 3.5 - 5.0 g/dL   AST 30 15 - 41 U/L   ALT 17 14 - 54 U/L   Alkaline Phosphatase 64 38 - 126 U/L   Total Bilirubin <0.1 (L) 0.3 - 1.2 mg/dL   GFR calc non Af Amer >60 >60 mL/min   GFR calc Af Amer >60 >60 mL/min    Comment: (NOTE) The eGFR has been calculated using the CKD EPI equation. This calculation has not been validated in all clinical situations. eGFR's persistently <60 mL/min signify possible Chronic Kidney Disease.    Anion gap 10 5 - 15   Lipase, blood     Status: Abnormal   Collection Time: 05/30/15 12:11 PM  Result Value Ref Range   Lipase 11 (L) 22 - 51 U/L  I-stat troponin, ED (only if pt is 71 y.o. or older & pain is above umbilicus)  not at [facility], [facility]     Status: None   Collection Time: 05/30/15 12:17 PM  Result Value Ref Range   Troponin i, poc 0.01 0.00 - 0.08 ng/mL   Comment 3            Comment: Due to the release kinetics of cTnI, a negative result within the first hours of the onset of symptoms does not rule out myocardial infarction with certainty. If myocardial infarction is still suspected, repeat the test at appropriate intervals.   Basic metabolic panel     Status: Abnormal   Collection Time: 05/31/15  5:10 AM  Result Value Ref Range   Sodium 140 135 - 145 mmol/L   Potassium 4.0 3.5 - 5.1 mmol/L   Chloride 104 101 - 111 mmol/L   CO2 26 22 - 32 mmol/L   Glucose, Bld 142 (H) 65 - 99 mg/dL   BUN 25 (H) 6 - 20 mg/dL   Creatinine, Ser 5.11 0.44 - 1.00 mg/dL   Calcium 8.8 (L) 8.9 - 10.3 mg/dL   GFR calc non Af Amer >60 >60 mL/min   GFR calc Af Amer >60 >60 mL/min    Comment: (NOTE) The eGFR has been calculated using the CKD EPI equation. This calculation has not been validated in all clinical situations. eGFR's persistently <60 mL/min signify possible Chronic Kidney Disease.    Anion gap 10 5 - 15  CBC     Status: Abnormal   Collection Time: 05/31/15  5:10 AM  Result Value Ref Range   WBC 8.6 4.0 - 10.5 K/uL   RBC 3.66 (L) 3.87 - 5.11 MIL/uL   Hemoglobin 10.0 (L) 12.0 - 15.0 g/dL   HCT 40.5 (L) 52.3 - 87.0 %   MCV 88.8 78.0 - 100.0 fL   MCH 27.3 26.0 - 34.0 pg   MCHC 30.8 30.0 - 36.0 g/dL   RDW 39.9 92.5 - 78.7 %   Platelets 386 150 - 400 K/uL    Dg Chest 2 View  05/30/2015  CLINICAL DATA:  Nausea, vomiting, productive cough  EXAM: CHEST  2 VIEW  COMPARISON:  12/10/2013  FINDINGS: Stable cardiomegaly with mild vascular congestion and basilar atelectasis. No definite focal pneumonia,  collapse or consolidation. No effusion or pneumothorax. Trachea midline. Degenerative changes of the spine with an associated scoliosis. Marked gastric distention noted with an large air-fluid level.  IMPRESSION: Cardiomegaly with vascular congestion and basilar atelectasis.  Marked gastric distention.   Electronically Signed   By: Jerilynn Mages.  Shick M.D.   On: 05/30/2015 14:32   Ct Abdomen Pelvis W Contrast  05/30/2015   CLINICAL DATA:  Nausea, vomiting and diarrhea since last night. Upper abdominal pain today. Initial encounter.  EXAM: CT ABDOMEN AND PELVIS WITH CONTRAST  TECHNIQUE: Multidetector CT imaging of the abdomen and pelvis was performed using the standard protocol following bolus administration of intravenous contrast.  CONTRAST:  33mL OMNIPAQUE IOHEXOL 300 MG/ML SOLN, 117mL OMNIPAQUE IOHEXOL 300 MG/ML SOLN  COMPARISON:  Radiographs 12/13/2013.  CT 12/10/2013.  FINDINGS: Lower chest: Mildly increased atelectasis at both lung bases. There is no pleural or pericardial effusion. A small hiatal hernia is noted.  Hepatobiliary: Intra and extrahepatic biliary dilatation is similar to the prior examination. The common bile duct measures up to 14 mm in diameter and appears dilated to the ampulla. The gallbladder is surgically absent. No focal hepatic abnormalities identified.  Pancreas: Unremarkable. No pancreatic ductal dilatation or surrounding inflammatory changes.  Spleen: Normal in size without focal abnormality.  Adrenals/Urinary Tract: Both adrenal glands appear normal.There are tiny low-density renal lesions bilaterally, likely cysts. No suspicious renal finding or hydronephrosis. There is no evidence of urinary tract calculus. The bladder is largely decompressed.  Stomach/Bowel: There is recurrent moderate distension of the stomach and proximal to mid small bowel. The distal small bowel is decompressed. Transition zone appears to be in the periumbilical region where there is mild twisting of the small bowel  but no discrete volvulus or soft tissue mass.The colon is normal in caliber. There is stool throughout the colon. There is strong concern of an infiltrating mass involving the right colon, measuring up to 6.1 cm transverse on image 47. There are prominent adjacent lymph nodes or peritoneal nodules, described below. No evidence of bowel perforation.  Vascular/Lymphatic: Stable atherosclerosis of the aorta, its branches and the iliac arteries. The left renal vein is circumaortic. There are multiple prominent lymph nodes around the right colon, including a 12 mm nodule lateral to it on image 42. Prominent ileocecal mesenteric nodes measure up to 8 mm on image 51. There is a prominent central mesenteric node measuring 9 mm on image 53. There is a small amount of perihepatic and pelvic ascites. There is no generalized peritoneal nodularity.  Reproductive: Stable uterine atrophy.  No adnexal mass.  Other: Stable postsurgical changes within the low anterior abdominal wall. No hernia identified.  Musculoskeletal: No acute or significant osseous findings. Moderate lumbar spondylosis appears unchanged.  IMPRESSION: 1. Recurrent high-grade mid to distal small bowel obstruction with transition point in the periumbilical region. 2. Strong concern of infiltrating mass involving the right colon, highly suspicious for colon cancer. There are adjacent prominent mesenteric lymph nodes and/or peritoneal nodules concerning for metastatic disease. No distant metastases identified. This colon mass does not appear to be the etiology for the bowel obstruction. 3. Small amount of free pelvic fluid without generalized peritoneal nodularity. 4. Generally stable biliary dilatation status post cholecystectomy.   Electronically Signed   By: Richardean Sale M.D.   On: 05/30/2015  16:54    Review of Systems  Constitutional: Negative.   HENT: Negative.   Eyes: Negative.   Respiratory: Negative.   Cardiovascular: Negative.    Gastrointestinal: Positive for nausea and abdominal pain.  Genitourinary: Negative.   Musculoskeletal: Negative.   Skin: Negative.   Neurological: Negative.   Endo/Heme/Allergies: Negative.   Psychiatric/Behavioral: Negative.    Blood pressure 137/76, pulse 102, temperature 97.8 F (36.6 C), temperature source Oral, resp. rate 22, height $RemoveBe'5\' 4"'[id number]$  (1.626 m), weight 90.946 kg (200 lb 8 oz), SpO2 95 %. Physical Exam  Constitutional: She appears well-developed and well-nourished. No distress.  HENT:  Head: Normocephalic and atraumatic.  Right Ear: External ear normal.  Left Ear: External ear normal.  Eyes: Conjunctivae are normal. Pupils are equal, round, and reactive to light. No scleral icterus.  Neck: Normal range of motion. Neck supple. No thyromegaly present.  Cardiovascular: Normal rate, regular rhythm and normal heart sounds.   Respiratory: Effort normal and breath sounds normal. No respiratory distress. She has no wheezes.  GI: Soft. She exhibits distension (mild). She exhibits no mass. There is no tenderness. There is no rebound and no guarding.  Well healed lower midline, peri-umbilical, and right subcostal incisions; no sign of hernia  Musculoskeletal: Normal range of motion. She exhibits no edema.  Lymphadenopathy:    She has no cervical adenopathy.  Neurological: She is alert.  Skin: Skin is warm and dry.  Psychiatric: She has a normal mood and affect. Her behavior is normal.    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 Office:  [place] 05/31/2015, 9:34 AM

## 2015-05-31 NOTE — Progress Notes (Signed)
[facility]                                                                              Patient Demographics  [name], is a 69 y.o. female, DOB - Jun 09, 1946, UXL:[id number]  Admit date - 05/30/2015   Admitting Physician Auburn Hester Krystal Eaton, MD  Outpatient Primary MD for the patient is Pcp Not In System  LOS - 1   Chief Complaint  Patient presents with  . Diarrhea  . Abdominal Pain  . Emesis       Brief HPI    Patient is a 69 year old female with hyperlipidemia, osteoporosis, prior history of small bowel obstruction, constipation was brought to ED due to complaints of abdominal pain with multiple episodes of nausea and vomiting. History and ER records, the caregiver at the bedside. Patient is able to provide only limited history due to her mental status, underlying MR. per the caregiver, patient started complaining of generalized abdominal pain since this morning. She had multiple episodes of nausea and vomiting at home and in the ER. She had also complained "loose stools" last night. Patient is unable to describe the abdominal pain, states that it was aching all over. Denied any fever, rashes, no chest pain or shortness of breath, hematochezia or melenic or hematemesis. EDP recommendations reviewed, CBC remarkable for leukocytosis 14.2 with left shift, BMET unremarkable. CT abdomen and pelvis showed recurrent high-grade mid to distal small bowel obstruction with transition point in the periumbilical region, infiltrating mass involving the right colon highly suspicious for colon cancer, at bedtime and prominent mesenteric lymph nodes and/or peritoneal nodules concerning for metastatic disease. No distant metastases identified  Assessment & Plan    High-grade SBO (small bowel obstruction): Also new diagnosis of colon mass -Continue NPO, IV fluid hydration, patient refusing NG tube - I had called patient's sister, Mrs. [name] HPO A, and updated in detail, she was  okay with NG tube and all further interventions needed including colonoscopy or surgery - Appreciate gastroenterology and surgery following, will need colonoscopy and possible colon resection this admission.  Active Problems:  Mental retardation - currently at her baseline mental status   HLD (hyperlipidemia) - Currently NPO  UTI - Urine culture in process, continue IV Rocephin  History of seizures - Unfortunately, no IV substitute of Felbamate, discussed with pharmacy, will place on IV Keppra for now   Code Status: Full code  Family Communication: Discussed in detail with the patient's sister yesterday.  Disposition Plan: Not medically ready  Time Spent in minutes   25 minutes  Procedures  CT abd  Consults   Surgery GI   DVT Prophylaxis  heparin   Medications  Scheduled Meds: . cefTRIAXone (ROCEPHIN)  IV  1 g Intravenous Q24H  . heparin  5,000 Units Subcutaneous 3 times per day  . levETIRAcetam  500 mg Intravenous Q12H  . sodium chloride  3 mL Intravenous Q12H   Continuous Infusions: . sodium chloride Stopped (05/30/15 2314)  . sodium chloride 75 mL/hr at 05/31/15 0058   PRN Meds:.acetaminophen **OR** acetaminophen, HYDROmorphone (DILAUDID) injection, morphine injection, ondansetron (ZOFRAN) IV   Antibiotics   Anti-infectives  Start     Dose/Rate Route Frequency Ordered Stop   05/31/15 0800  cefTRIAXone (ROCEPHIN) 1 g in dextrose 5 % 50 mL IVPB - Premix     1 g 100 mL/hr over 30 Minutes Intravenous Every 24 hours 05/31/15 0742          Subjective:   Jhoselin Jezewski was seen and examined today. Difficult to obtain ROS from the patient due to mental status and, denies any pain, refused NG tube  Objective:   Blood pressure 137/76, pulse 102, temperature 97.8 F (36.6 C), temperature source Oral, resp. rate 22, height 5\' 4"  (1.626 m), weight 90.946 kg (200 lb 8 oz), SpO2 95 %.  Wt Readings from Last 3 Encounters:  05/31/15 90.946 kg (200 lb 8 oz)    05/06/14 91.264 kg (201 lb 3.2 oz)  12/13/13 92.443 kg (203 lb 12.8 oz)     Intake/Output Summary (Last 24 hours) at 05/31/15 1159 Last data filed at 05/31/15 0758  Gross per 24 hour  Intake    125 ml  Output      2 ml  Net    123 ml    Exam  General: Alert and oriented x self, NAD  HEENT:  PERRLA, EOMI,   Neck: Supple, no JVD, no masses  CVS: S1 S2 auscultated, no rubs, murmurs or gallops. Regular rate and rhythm.  Respiratory: Clear to auscultation bilaterally, no wheezing, rales or rhonchi  Abdomen: Soft, nontender, hypoactive bowel sounds  Ext: no cyanosis clubbing or edema  Neuro: no new deficits  Skin: No rashes  Psych: Alert   Data Review   Micro Results No results found for this or any previous visit (from the past 240 hour(s)).  Radiology Reports Dg Chest 2 View  05/30/2015   CLINICAL DATA:  Nausea, vomiting, productive cough  EXAM: CHEST  2 VIEW  COMPARISON:  12/10/2013  FINDINGS: Stable cardiomegaly with mild vascular congestion and basilar atelectasis. No definite focal pneumonia, collapse or consolidation. No effusion or pneumothorax. Trachea midline. Degenerative changes of the spine with an associated scoliosis. Marked gastric distention noted with an large air-fluid level.  IMPRESSION: Cardiomegaly with vascular congestion and basilar atelectasis.  Marked gastric distention.   Electronically Signed   By: Jerilynn Mages.  Shick M.D.   On: 05/30/2015 14:32   Ct Abdomen Pelvis W Contrast  05/30/2015   CLINICAL DATA:  Nausea, vomiting and diarrhea since last night. Upper abdominal pain today. Initial encounter.  EXAM: CT ABDOMEN AND PELVIS WITH CONTRAST  TECHNIQUE: Multidetector CT imaging of the abdomen and pelvis was performed using the standard protocol following bolus administration of intravenous contrast.  CONTRAST:  54mL OMNIPAQUE IOHEXOL 300 MG/ML SOLN, 159mL OMNIPAQUE IOHEXOL 300 MG/ML SOLN  COMPARISON:  Radiographs 12/13/2013.  CT 12/10/2013.  FINDINGS: Lower  chest: Mildly increased atelectasis at both lung bases. There is no pleural or pericardial effusion. A small hiatal hernia is noted.  Hepatobiliary: Intra and extrahepatic biliary dilatation is similar to the prior examination. The common bile duct measures up to 14 mm in diameter and appears dilated to the ampulla. The gallbladder is surgically absent. No focal hepatic abnormalities identified.  Pancreas: Unremarkable. No pancreatic ductal dilatation or surrounding inflammatory changes.  Spleen: Normal in size without focal abnormality.  Adrenals/Urinary Tract: Both adrenal glands appear normal.There are tiny low-density renal lesions bilaterally, likely cysts. No suspicious renal finding or hydronephrosis. There is no evidence of urinary tract calculus. The bladder is largely decompressed.  Stomach/Bowel: There is recurrent moderate distension of  the stomach and proximal to mid small bowel. The distal small bowel is decompressed. Transition zone appears to be in the periumbilical region where there is mild twisting of the small bowel but no discrete volvulus or soft tissue mass.The colon is normal in caliber. There is stool throughout the colon. There is strong concern of an infiltrating mass involving the right colon, measuring up to 6.1 cm transverse on image 47. There are prominent adjacent lymph nodes or peritoneal nodules, described below. No evidence of bowel perforation.  Vascular/Lymphatic: Stable atherosclerosis of the aorta, its branches and the iliac arteries. The left renal vein is circumaortic. There are multiple prominent lymph nodes around the right colon, including a 12 mm nodule lateral to it on image 42. Prominent ileocecal mesenteric nodes measure up to 8 mm on image 51. There is a prominent central mesenteric node measuring 9 mm on image 53. There is a small amount of perihepatic and pelvic ascites. There is no generalized peritoneal nodularity.  Reproductive: Stable uterine atrophy.  No  adnexal mass.  Other: Stable postsurgical changes within the low anterior abdominal wall. No hernia identified.  Musculoskeletal: No acute or significant osseous findings. Moderate lumbar spondylosis appears unchanged.  IMPRESSION: 1. Recurrent high-grade mid to distal small bowel obstruction with transition point in the periumbilical region. 2. Strong concern of infiltrating mass involving the right colon, highly suspicious for colon cancer. There are adjacent prominent mesenteric lymph nodes and/or peritoneal nodules concerning for metastatic disease. No distant metastases identified. This colon mass does not appear to be the etiology for the bowel obstruction. 3. Small amount of free pelvic fluid without generalized peritoneal nodularity. 4. Generally stable biliary dilatation status post cholecystectomy.   Electronically Signed   By: Richardean Sale M.D.   On: 05/30/2015 16:54    CBC  Recent Labs Lab 05/30/15 1211 05/31/15 0510  WBC 14.2* 8.6  HGB 11.3* 10.0*  HCT 36.4 32.5*  PLT 476* 386  MCV 87.9 88.8  MCH 27.3 27.3  MCHC 31.0 30.8  RDW 13.6 13.7  LYMPHSABS 1.0  --   MONOABS 0.9  --   EOSABS 0.0  --   BASOSABS 0.0  --     Chemistries   Recent Labs Lab 05/30/15 1211 05/31/15 0510  NA 141 140  K 4.0 4.0  CL 107 104  CO2 24 26  GLUCOSE 152* 142*  BUN 23* 25*  CREATININE 0.73 0.65  CALCIUM 9.3 8.8*  AST 30  --   ALT 17  --   ALKPHOS 64  --   BILITOT <0.1*  --    ------------------------------------------------------------------------------------------------------------------ estimated creatinine clearance is 71.5 mL/min (by C-G formula based on Cr of 0.65). ------------------------------------------------------------------------------------------------------------------ No results for input(s): HGBA1C in the last 72 hours. ------------------------------------------------------------------------------------------------------------------ No results for input(s): CHOL,  HDL, LDLCALC, TRIG, CHOLHDL, LDLDIRECT in the last 72 hours. ------------------------------------------------------------------------------------------------------------------ No results for input(s): TSH, T4TOTAL, T3FREE, THYROIDAB in the last 72 hours.  Invalid input(s): FREET3 ------------------------------------------------------------------------------------------------------------------ No results for input(s): VITAMINB12, FOLATE, FERRITIN, TIBC, IRON, RETICCTPCT in the last 72 hours.  Coagulation profile No results for input(s): INR, PROTIME in the last 168 hours.  No results for input(s): DDIMER in the last 72 hours.  Cardiac Enzymes No results for input(s): CKMB, TROPONINI, MYOGLOBIN in the last 168 hours.  Invalid input(s): CK ------------------------------------------------------------------------------------------------------------------ Invalid input(s): POCBNP  No results for input(s): GLUCAP in the last 72 hours.   Hughey Rittenberry M.D. [facility] 05/31/2015, 11:59 AM  Pager: [phone]   Between 7am to 7pm -  call Pager - [phone]  After 7pm go to [email or web address] - password [email or web address]  Call night coverage person covering after 7pm

## 2015-05-31 NOTE — Consult Note (Signed)
Consultation  Referring Provider:   [facility]   Primary Care Physician:  Pcp Not In System Primary Gastroenterologist: Unassigned        Reason for Consultation: colon mass on CTscan              HPI:   [name] is a 69 y.o. female admitted from ED yesterday with nausea, vomiting, loose stools and generalized abdominal pain. I had a difficult time getting detail from patient. She takes a long time to answer questions, doesn't answer some of them. Not in [place] but patient apparently has some degree of mental retardation.   CTscan c/w SBO. Transition zone appears to be in periumbilical region where there is mild twisting of small bowel. Additionally there is concern for infiltrating mass involving right colon. Multiple prominent lymph nodes seen around right colon.   Past Medical History  Diagnosis Date  . Hypercholesteremia   . Constipation   . Menopause   . Osteoporosis   . Seizure   . Edema     PSH: Patient denies any history of abdominal surgery but postcholecystectomy per CTscan.   [place]: denies history of colon cancer  History  Substance Use Topics  . Smoking status: Never Smoker   . Smokeless tobacco: Not on file  . Alcohol Use: No    Prior to Admission medications   Medication Sig Start Date End Date Taking? Authorizing Provider  atorvastatin (LIPITOR) 10 MG tablet Take 10 mg by mouth at bedtime.    Yes Historical Provider, MD  bisacodyl (DULCOLAX) 5 MG EC tablet Take 5 mg by mouth daily as needed for mild constipation or moderate constipation.   Yes Historical Provider, MD  calcium-vitamin D (OSCAL WITH D) 500-200 MG-UNIT per tablet Take 1 tablet by mouth 2 (two) times daily.    Yes Historical Provider, MD  docusate sodium (COLACE) 100 MG capsule Take 100 mg by mouth 2 (two) times daily.   Yes Historical Provider, MD  fenofibrate (TRICOR) 145 MG tablet Take 145 mg by mouth every morning.    Yes Historical Provider, MD  ferrous fumarate (HEMOCYTE - 106 MG  FE) 325 (106 FE) MG TABS tablet Take 1 tablet by mouth 2 (two) times daily.   Yes Historical Provider, MD  furosemide (LASIX) 20 MG tablet Take 20 mg by mouth every morning.    Yes Historical Provider, MD  lactulose, encephalopathy, (CHRONULAC) 10 GM/15ML SOLN Take 10 g by mouth 2 (two) times daily as needed (constipation).   Yes Historical Provider, MD  metroNIDAZOLE (METROGEL) 1 % gel Apply 1 application topically daily. Started 6.1.16   Yes Historical Provider, MD  potassium chloride SA (K-DUR,KLOR-CON) 20 MEQ tablet Take 20 mEq by mouth daily.    Yes Historical Provider, MD  Vitamin D, Ergocalciferol, (DRISDOL) 50000 UNITS CAPS capsule Take 50,000 Units by mouth every 7 (seven) days. Friday   Yes Historical Provider, MD  felbamate (FELBATOL) 600 MG tablet Take 1,200 mg by mouth 2 (two) times daily.     Historical Provider, MD    Current Facility-Administered Medications  Medication Dose Route Frequency Provider Last Rate Last Dose  . 0.9 %  sodium chloride infusion   Intravenous Continuous Francine Graven, DO   Stopped at 05/30/15 2314  . 0.9 %  sodium chloride infusion   Intravenous Continuous Ripudeep Krystal Eaton, MD 75 mL/hr at 05/31/15 0058    . acetaminophen (TYLENOL) tablet 650 mg  650 mg Oral Q6H PRN Ripudeep K Rai,  MD       Or  . acetaminophen (TYLENOL) suppository 650 mg  650 mg Rectal Q6H PRN Ripudeep K Rai, MD      . cefTRIAXone (ROCEPHIN) 1 g in dextrose 5 % 50 mL IVPB - Premix  1 g Intravenous Q24H Ripudeep K Rai, MD   1 g at 05/31/15 0758  . heparin injection 5,000 Units  5,000 Units Subcutaneous 3 times per day Ripudeep Krystal Eaton, MD   5,000 Units at 05/31/15 0126  . HYDROmorphone (DILAUDID) injection 1 mg  1 mg Intravenous Q4H PRN Ripudeep K Rai, MD      . morphine 2 MG/ML injection 1 mg  1 mg Intravenous Q1H PRN Francine Graven, DO   1 mg at 05/30/15 1418  . ondansetron (ZOFRAN) injection 4 mg  4 mg Intravenous Q6H PRN Ripudeep Krystal Eaton, MD   4 mg at 05/31/15 0758  . sodium chloride  0.9 % injection 3 mL  3 mL Intravenous Q12H Ripudeep K Rai, MD   3 mL at 05/31/15 0129    Allergies as of 05/30/2015  . (No Known Allergies)     Review of Systems:    All systems reviewed and negative except where note in HPI.    Physical Exam:  Vital signs in last 24 hours: Temp:  [97.8 F (36.6 C)-98.7 F (37.1 C)] 97.8 F (36.6 C) (06/06 0550) Pulse Rate:  [100-137] 102 (06/06 0550) Resp:  [18-23] 22 (06/06 0550) BP: (124-151)/(61-91) 137/76 mmHg (06/06 0550) SpO2:  [93 %-97 %] 95 % (06/06 0550) Weight:  [200 lb 8 oz (90.946 kg)] 200 lb 8 oz (90.946 kg) (06/06 0004)   General:   Obese white female in NAD Head:  Normocephalic and atraumatic. Eyes:   No icterus.   Conjunctiva pink. Ears:  Normal auditory acuity. Neck:  Supple Lungs:  Respirations even and unlabored. Lungs clear to auscultation bilaterally.   No wheezes, crackles, or rhonchi.  Heart:  Regular rate and rhythm Abdomen:  Soft, obese, nontender. Left mid abdomen with abnormal BS.  Msk:  Symmetrical without gross deformities.  Extremities:  1+ BLE edema. Neurologic:  Alert ,oriented x4; Skin:  Intact without significant lesions or rashes. Psych:  Alert and cooperative. Normal affect.  LAB RESULTS:  Recent Labs  05/30/15 1211 05/31/15 0510  WBC 14.2* 8.6  HGB 11.3* 10.0*  HCT 36.4 32.5*  PLT 476* 386   BMET  Recent Labs  05/30/15 1211 05/31/15 0510  NA 141 140  K 4.0 4.0  CL 107 104  CO2 24 26  GLUCOSE 152* 142*  BUN 23* 25*  CREATININE 0.73 0.65  CALCIUM 9.3 8.8*   LFT  Recent Labs  05/30/15 1211  PROT 7.6  ALBUMIN 3.4*  AST 30  ALT 17  ALKPHOS 64  BILITOT <0.1*   STUDIES: Dg Chest 2 View  05/30/2015   CLINICAL DATA:  Nausea, vomiting, productive cough  EXAM: CHEST  2 VIEW  COMPARISON:  12/10/2013  FINDINGS: Stable cardiomegaly with mild vascular congestion and basilar atelectasis. No definite focal pneumonia, collapse or consolidation. No effusion or pneumothorax. Trachea  midline. Degenerative changes of the spine with an associated scoliosis. Marked gastric distention noted with an large air-fluid level.  IMPRESSION: Cardiomegaly with vascular congestion and basilar atelectasis.  Marked gastric distention.   Electronically Signed   By: Jerilynn Mages.  Shick M.D.   On: 05/30/2015 14:32   Ct Abdomen Pelvis W Contrast  05/30/2015   CLINICAL DATA:  Nausea, vomiting and diarrhea since  last night. Upper abdominal pain today. Initial encounter.  EXAM: CT ABDOMEN AND PELVIS WITH CONTRAST  TECHNIQUE: Multidetector CT imaging of the abdomen and pelvis was performed using the standard protocol following bolus administration of intravenous contrast.  CONTRAST:  67mL OMNIPAQUE IOHEXOL 300 MG/ML SOLN, 171mL OMNIPAQUE IOHEXOL 300 MG/ML SOLN  COMPARISON:  Radiographs 12/13/2013.  CT 12/10/2013.  FINDINGS: Lower chest: Mildly increased atelectasis at both lung bases. There is no pleural or pericardial effusion. A small hiatal hernia is noted.  Hepatobiliary: Intra and extrahepatic biliary dilatation is similar to the prior examination. The common bile duct measures up to 14 mm in diameter and appears dilated to the ampulla. The gallbladder is surgically absent. No focal hepatic abnormalities identified.  Pancreas: Unremarkable. No pancreatic ductal dilatation or surrounding inflammatory changes.  Spleen: Normal in size without focal abnormality.  Adrenals/Urinary Tract: Both adrenal glands appear normal.There are tiny low-density renal lesions bilaterally, likely cysts. No suspicious renal finding or hydronephrosis. There is no evidence of urinary tract calculus. The bladder is largely decompressed.  Stomach/Bowel: There is recurrent moderate distension of the stomach and proximal to mid small bowel. The distal small bowel is decompressed. Transition zone appears to be in the periumbilical region where there is mild twisting of the small bowel but no discrete volvulus or soft tissue mass.The colon is normal  in caliber. There is stool throughout the colon. There is strong concern of an infiltrating mass involving the right colon, measuring up to 6.1 cm transverse on image 47. There are prominent adjacent lymph nodes or peritoneal nodules, described below. No evidence of bowel perforation.  Vascular/Lymphatic: Stable atherosclerosis of the aorta, its branches and the iliac arteries. The left renal vein is circumaortic. There are multiple prominent lymph nodes around the right colon, including a 12 mm nodule lateral to it on image 42. Prominent ileocecal mesenteric nodes measure up to 8 mm on image 51. There is a prominent central mesenteric node measuring 9 mm on image 53. There is a small amount of perihepatic and pelvic ascites. There is no generalized peritoneal nodularity.  Reproductive: Stable uterine atrophy.  No adnexal mass.  Other: Stable postsurgical changes within the low anterior abdominal wall. No hernia identified.  Musculoskeletal: No acute or significant osseous findings. Moderate lumbar spondylosis appears unchanged.  IMPRESSION: 1. Recurrent high-grade mid to distal small bowel obstruction with transition point in the periumbilical region. 2. Strong concern of infiltrating mass involving the right colon, highly suspicious for colon cancer. There are adjacent prominent mesenteric lymph nodes and/or peritoneal nodules concerning for metastatic disease. No distant metastases identified. This colon mass does not appear to be the etiology for the bowel obstruction. 3. Small amount of free pelvic fluid without generalized peritoneal nodularity. 4. Generally stable biliary dilatation status post cholecystectomy.   Electronically Signed   By: Richardean Sale M.D.   On: 05/30/2015 16:54    PREVIOUS ENDOSCOPIES:            Patient thinks she had a colonoscopy at some point in time, cannot remember any details.    Impression / Plan:   52. 69 year old female with recurrent small bowel obstruction (had one in  December 2014) probably secondary to adhesions. There is also suggestion of right colon mass on CTscan (unrelated to SBO). Patient will need colonoscopy when SBO resolves and she can tolerate prep. Surgery following, anticipates need for colonic resection this admission. No nausea / vomiting today. Abdominal exam unremarkable except for a few abnormal bowel  sounds. Repeat abdominal films in am   2. Mental retardation. Will need to get colonoscopy consent from [place]  Thanks   LOS: 1 day   Tye Savoy  05/31/2015, 9:39 AM

## 2015-06-01 ENCOUNTER — Inpatient Hospital Stay (HOSPITAL_COMMUNITY): Payer: Medicare Other

## 2015-06-01 LAB — URINE CULTURE
COLONY COUNT: NO GROWTH
CULTURE: NO GROWTH

## 2015-06-01 LAB — CEA: CEA: 27.1 ng/mL — ABNORMAL HIGH (ref 0.0–4.7)

## 2015-06-01 LAB — CLOSTRIDIUM DIFFICILE BY PCR: Toxigenic C. Difficile by PCR: NEGATIVE

## 2015-06-01 MED ORDER — LORAZEPAM 2 MG/ML IJ SOLN
1.0000 mg | Freq: Once | INTRAMUSCULAR | Status: AC
  Administered 2015-06-01: 1 mg via INTRAVENOUS

## 2015-06-01 MED ORDER — LIP MEDEX EX OINT
TOPICAL_OINTMENT | CUTANEOUS | Status: AC
  Administered 2015-06-01: 13:00:00
  Filled 2015-06-01: qty 7

## 2015-06-01 MED ORDER — LORAZEPAM 2 MG/ML IJ SOLN
INTRAMUSCULAR | Status: AC
  Filled 2015-06-01: qty 1

## 2015-06-01 NOTE — Progress Notes (Signed)
OT Cancellation Note  Patient Details Name: [name] MRN: [id number] DOB: 05-31-1946   Cancelled Treatment:    Reason Eval/Treat Not Completed: Fatigue/lethargy limiting ability to participate Pt agreed to work with OT later in day  Betsy Pries 06/01/2015, 9:12 AM

## 2015-06-01 NOTE — Progress Notes (Signed)
[facility]                                                                              Patient Demographics  [name], is a 69 y.o. female, DOB - February 18, 1946, OYD:[id number]  Admit date - 05/30/2015   Admitting Physician Salar Molden Krystal Eaton, MD  Outpatient Primary MD for the patient is Pcp Not In System  LOS - 2   Chief Complaint  Patient presents with  . Diarrhea  . Abdominal Pain  . Emesis       Brief HPI    Patient is a 69 year old female with hyperlipidemia, osteoporosis, prior history of small bowel obstruction, constipation was brought to ED due to complaints of abdominal pain with multiple episodes of nausea and vomiting. History and ER records, the caregiver at the bedside. Patient is Fox to provide only limited history due to her mental status, underlying MR. per the caregiver, patient started complaining of generalized abdominal pain since this morning. She had multiple episodes of nausea and vomiting at home and in the ER. She had also complained "loose stools" last night. Patient is unable to describe the abdominal pain, states that it was aching all over. Denied any fever, rashes, no chest pain or shortness of breath, hematochezia or melenic or hematemesis. EDP recommendations reviewed, CBC remarkable for leukocytosis 14.2 with left shift, BMET unremarkable. CT abdomen and pelvis showed recurrent high-grade mid to distal small bowel obstruction with transition point in the periumbilical region, infiltrating mass involving the right colon highly suspicious for colon cancer, at bedtime and prominent mesenteric lymph nodes and/or peritoneal nodules concerning for metastatic disease. No distant metastases identified  Assessment & Plan    High-grade SBO (small bowel obstruction): Also new diagnosis of colon mass - Continue NPO, IV fluid hydration, patient refused NG tube, not having any further nausea, vomiting or abdominal pain, CEA elevated at  27.1 -Appreciating GI and surgery following, abdominal x-ray this morning also shows high-grade small bowel obstruction, without any change. Likely will need colon resection. Colonoscopy pending, I had called patient's sister, Mrs. [name] [facility] on admission and she wanted all interventions needed including colonoscopy or colon surgery  Active Problems:  Mental retardation - currently at her baseline mental status   HLD (hyperlipidemia) - Currently NPO  UTI - Urine culture showed only 35,000 colonies, currently on IV Rocephin  History of seizures - Unfortunately, no IV substitute of Felbamate, discussed with pharmacy, will place on IV Keppra for now   Code Status: Full code  Family Communication: No family member at the bedside  Disposition Plan: Not medically ready  Time Spent in minutes   25 minutes  Procedures  CT abd  Consults   Surgery GI   DVT Prophylaxis  heparin   Medications  Scheduled Meds: . cefTRIAXone (ROCEPHIN)  IV  1 g Intravenous Q24H  . heparin  5,000 Units Subcutaneous 3 times per day  . levETIRAcetam  500 mg Intravenous Q12H  . sodium chloride  3 mL Intravenous Q12H   Continuous Infusions: . sodium chloride Stopped (05/30/15 2314)  . sodium chloride 75 mL/hr at 05/31/15 1507  PRN Meds:.acetaminophen **OR** acetaminophen, HYDROmorphone (DILAUDID) injection, morphine injection, ondansetron (ZOFRAN) IV   Antibiotics   Anti-infectives    Start     Dose/Rate Route Frequency Ordered Stop   05/31/15 0800  cefTRIAXone (ROCEPHIN) 1 g in dextrose 5 % 50 mL IVPB - Premix     1 g 100 mL/hr over 30 Minutes Intravenous Every 24 hours 05/31/15 0742          Subjective:   [name] was seen and examined today. Difficult to obtain ROS from the patient due to mental status however she states that she is feeling better, has not had any vomiting episodes. Patient had refused NG tube. Currently no abdominal pain, afebrile.    Objective:    Blood pressure 119/49, pulse 100, temperature 98.2 F (36.8 C), temperature source Oral, resp. rate 18, height 5\' 4"  (1.626 m), weight 90.946 kg (200 lb 8 oz), SpO2 94 %.  Wt Readings from Last 3 Encounters:  05/31/15 90.946 kg (200 lb 8 oz)  05/06/14 91.264 kg (201 lb 3.2 oz)  12/13/13 92.443 kg (203 lb 12.8 oz)     Intake/Output Summary (Last 24 hours) at 06/01/15 1114 Last data filed at 06/01/15 0500  Gross per 24 hour  Intake   1860 ml  Output      0 ml  Net   1860 ml    Exam  General: Alert and oriented x self, NAD  HEENT:  PERRLA, EOMI,   Neck: Supple, no JVD, no masses  CVS: S1 S2 clear, RRR  Respiratory: CTAB  Abdomen:  hypoactive bowel sounds, NT  Ext: no cyanosis clubbing or edema  Neuro: no new deficits  Skin: No rashes  Psych: Alert   Data Review   Micro Results Recent Results (from the past 240 hour(s))  Urine culture     Status: None   Collection Time: 05/30/15 11:35 AM  Result Value Ref Range Status   Specimen Description URINE, CLEAN CATCH  Final   Special Requests NONE  Final   Colony Count   Final    35,000 COLONIES/ML Performed at [facility]    Culture   Final    Multiple bacterial morphotypes present, none predominant. Suggest appropriate recollection if clinically indicated. Performed at [facility]    Report Status 05/31/2015 FINAL  Final    Radiology Reports Dg Chest 2 View  05/30/2015   CLINICAL DATA:  Nausea, vomiting, productive cough  EXAM: CHEST  2 VIEW  COMPARISON:  12/10/2013  FINDINGS: Stable cardiomegaly with mild vascular congestion and basilar atelectasis. No definite focal pneumonia, collapse or consolidation. No effusion or pneumothorax. Trachea midline. Degenerative changes of the spine with an associated scoliosis. Marked gastric distention noted with an large air-fluid level.  IMPRESSION: Cardiomegaly with vascular congestion and basilar atelectasis.  Marked gastric distention.   Electronically  Signed   By: Jerilynn Mages.  Shick M.D.   On: 05/30/2015 14:32   Ct Abdomen Pelvis W Contrast  05/30/2015   CLINICAL DATA:  Nausea, vomiting and diarrhea since last night. Upper abdominal pain today. Initial encounter.  EXAM: CT ABDOMEN AND PELVIS WITH CONTRAST  TECHNIQUE: Multidetector CT imaging of the abdomen and pelvis was performed using the standard protocol following bolus administration of intravenous contrast.  CONTRAST:  76mL OMNIPAQUE IOHEXOL 300 MG/ML SOLN, 138mL OMNIPAQUE IOHEXOL 300 MG/ML SOLN  COMPARISON:  Radiographs 12/13/2013.  CT 12/10/2013.  FINDINGS: Lower chest: Mildly increased atelectasis at both lung bases. There is no pleural or pericardial effusion. A  small hiatal hernia is noted.  Hepatobiliary: Intra and extrahepatic biliary dilatation is similar to the prior examination. The common bile duct measures up to 14 mm in diameter and appears dilated to the ampulla. The gallbladder is surgically absent. No focal hepatic abnormalities identified.  Pancreas: Unremarkable. No pancreatic ductal dilatation or surrounding inflammatory changes.  Spleen: Normal in size without focal abnormality.  Adrenals/Urinary Tract: Both adrenal glands appear normal.There are tiny low-density renal lesions bilaterally, likely cysts. No suspicious renal finding or hydronephrosis. There is no evidence of urinary tract calculus. The bladder is largely decompressed.  Stomach/Bowel: There is recurrent moderate distension of the stomach and proximal to mid small bowel. The distal small bowel is decompressed. Transition zone appears to be in the periumbilical region where there is mild twisting of the small bowel but no discrete volvulus or soft tissue mass.The colon is normal in caliber. There is stool throughout the colon. There is strong concern of an infiltrating mass involving the right colon, measuring up to 6.1 cm transverse on image 47. There are prominent adjacent lymph nodes or peritoneal nodules, described below. No  evidence of bowel perforation.  Vascular/Lymphatic: Stable atherosclerosis of the aorta, its branches and the iliac arteries. The left renal vein is circumaortic. There are multiple prominent lymph nodes around the right colon, including a 12 mm nodule lateral to it on image 42. Prominent ileocecal mesenteric nodes measure up to 8 mm on image 51. There is a prominent central mesenteric node measuring 9 mm on image 53. There is a small amount of perihepatic and pelvic ascites. There is no generalized peritoneal nodularity.  Reproductive: Stable uterine atrophy.  No adnexal mass.  Other: Stable postsurgical changes within the low anterior abdominal wall. No hernia identified.  Musculoskeletal: No acute or significant osseous findings. Moderate lumbar spondylosis appears unchanged.  IMPRESSION: 1. Recurrent high-grade mid to distal small bowel obstruction with transition point in the periumbilical region. 2. Strong concern of infiltrating mass involving the right colon, highly suspicious for colon cancer. There are adjacent prominent mesenteric lymph nodes and/or peritoneal nodules concerning for metastatic disease. No distant metastases identified. This colon mass does not appear to be the etiology for the bowel obstruction. 3. Small amount of free pelvic fluid without generalized peritoneal nodularity. 4. Generally stable biliary dilatation status post cholecystectomy.   Electronically Signed   By: Richardean Sale M.D.   On: 05/30/2015 16:54   Dg Abd 2 Views  06/01/2015   CLINICAL DATA:  Small bowel obstruction. Abdominal pain and distention.  EXAM: ABDOMEN - 2 VIEW  COMPARISON:  CT topogram on 05/30/2015  FINDINGS: Multiple moderately dilated small bowel loops are again seen containing air-fluid levels, without significant change compared to prior exam. Some gas and oral contrast is seen within nondilated colon. This is consistent with a high-grade partial small bowel obstruction. No evidence of free  intraperitoneal air. Right upper quadrant surgical clips seen from prior cholecystectomy.  IMPRESSION: High-grade partial small bowel obstruction, without significant change compared to recent CT.   Electronically Signed   By: Earle Gell M.D.   On: 06/01/2015 10:59    CBC  Recent Labs Lab 05/30/15 1211 05/31/15 0510  WBC 14.2* 8.6  HGB 11.3* 10.0*  HCT 36.4 32.5*  PLT 476* 386  MCV 87.9 88.8  MCH 27.3 27.3  MCHC 31.0 30.8  RDW 13.6 13.7  LYMPHSABS 1.0  --   MONOABS 0.9  --   EOSABS 0.0  --   BASOSABS 0.0  --  Chemistries   Recent Labs Lab 05/30/15 1211 05/31/15 0510  NA 141 140  K 4.0 4.0  CL 107 104  CO2 24 26  GLUCOSE 152* 142*  BUN 23* 25*  CREATININE 0.73 0.65  CALCIUM 9.3 8.8*  AST 30  --   ALT 17  --   ALKPHOS 64  --   BILITOT <0.1*  --    ------------------------------------------------------------------------------------------------------------------ estimated creatinine clearance is 71.5 mL/min (by C-G formula based on Cr of 0.65). ------------------------------------------------------------------------------------------------------------------ No results for input(s): HGBA1C in the last 72 hours. ------------------------------------------------------------------------------------------------------------------ No results for input(s): CHOL, HDL, LDLCALC, TRIG, CHOLHDL, LDLDIRECT in the last 72 hours. ------------------------------------------------------------------------------------------------------------------ No results for input(s): TSH, T4TOTAL, T3FREE, THYROIDAB in the last 72 hours.  Invalid input(s): FREET3 ------------------------------------------------------------------------------------------------------------------ No results for input(s): VITAMINB12, FOLATE, FERRITIN, TIBC, IRON, RETICCTPCT in the last 72 hours.  Coagulation profile No results for input(s): INR, PROTIME in the last 168 hours.  No results for input(s): DDIMER in the  last 72 hours.  Cardiac Enzymes No results for input(s): CKMB, TROPONINI, MYOGLOBIN in the last 168 hours.  Invalid input(s): CK ------------------------------------------------------------------------------------------------------------------ Invalid input(s): POCBNP  No results for input(s): GLUCAP in the last 72 hours.   Reola Buckles M.D. [facility] 06/01/2015, 11:14 AM  Pager: [phone]   Between 7am to 7pm - call Pager - [phone]  After 7pm go to [email or web address] - password [email or web address]  Call night coverage person covering after 7pm

## 2015-06-01 NOTE — Progress Notes (Signed)
Subjective: She says she feels better, and that's about all she will respond to.  Abdomen is non tender on palpation.    Objective: Vital signs in last 24 hours: Temp:  [98.1 F (36.7 C)-98.2 F (36.8 C)] 98.2 F (36.8 C) (06/07 0547) Pulse Rate:  [93-100] 100 (06/07 0547) Resp:  [18-20] 18 (06/07 0547) BP: (119-135)/(49-70) 119/49 mmHg (06/07 0547) SpO2:  [92 %-97 %] 94 % (06/07 0547) Last BM Date: 05/31/15  Intake/Output from previous day: 06/06 0701 - 06/07 0700 In: 1910 [I.V.:1650; IV Piggyback:260] Out: 1 [Urine:1] Intake/Output this shift:  3 BM's recorded  Urine x 1 recorded Afebrile, VSS Labs OK Film pending    General appearance: alert and no distress GI: soft, non-tender; she has a fewbowel sounds ; no masses,  no organomegaly  Lab Results:   Recent Labs  05/30/15 1211 05/31/15 0510  WBC 14.2* 8.6  HGB 11.3* 10.0*  HCT 36.4 32.5*  PLT 476* 386    BMET  Recent Labs  05/30/15 1211 05/31/15 0510  NA 141 140  K 4.0 4.0  CL 107 104  CO2 24 26  GLUCOSE 152* 142*  BUN 23* 25*  CREATININE 0.73 0.65  CALCIUM 9.3 8.8*   PT/INR No results for input(s): LABPROT, INR in the last 72 hours.   Recent Labs Lab 05/30/15 1211  AST 30  ALT 17  ALKPHOS 64  BILITOT <0.1*  PROT 7.6  ALBUMIN 3.4*     Lipase     Component Value Date/Time   LIPASE 11* 05/30/2015 1211     Studies/Results: Dg Chest 2 View  05/30/2015   CLINICAL DATA:  Nausea, vomiting, productive cough  EXAM: CHEST  2 VIEW  COMPARISON:  12/10/2013  FINDINGS: Stable cardiomegaly with mild vascular congestion and basilar atelectasis. No definite focal pneumonia, collapse or consolidation. No effusion or pneumothorax. Trachea midline. Degenerative changes of the spine with an associated scoliosis. Marked gastric distention noted with an large air-fluid level.  IMPRESSION: Cardiomegaly with vascular congestion and basilar atelectasis.  Marked gastric distention.   Electronically Signed    By: Jerilynn Mages.  Shick M.D.   On: 05/30/2015 14:32   Ct Abdomen Pelvis W Contrast  05/30/2015   CLINICAL DATA:  Nausea, vomiting and diarrhea since last night. Upper abdominal pain today. Initial encounter.  EXAM: CT ABDOMEN AND PELVIS WITH CONTRAST  TECHNIQUE: Multidetector CT imaging of the abdomen and pelvis was performed using the standard protocol following bolus administration of intravenous contrast.  CONTRAST:  73mL OMNIPAQUE IOHEXOL 300 MG/ML SOLN, 159mL OMNIPAQUE IOHEXOL 300 MG/ML SOLN  COMPARISON:  Radiographs 12/13/2013.  CT 12/10/2013.  FINDINGS: Lower chest: Mildly increased atelectasis at both lung bases. There is no pleural or pericardial effusion. A small hiatal hernia is noted.  Hepatobiliary: Intra and extrahepatic biliary dilatation is similar to the prior examination. The common bile duct measures up to 14 mm in diameter and appears dilated to the ampulla. The gallbladder is surgically absent. No focal hepatic abnormalities identified.  Pancreas: Unremarkable. No pancreatic ductal dilatation or surrounding inflammatory changes.  Spleen: Normal in size without focal abnormality.  Adrenals/Urinary Tract: Both adrenal glands appear normal.There are tiny low-density renal lesions bilaterally, likely cysts. No suspicious renal finding or hydronephrosis. There is no evidence of urinary tract calculus. The bladder is largely decompressed.  Stomach/Bowel: There is recurrent moderate distension of the stomach and proximal to mid small bowel. The distal small bowel is decompressed. Transition zone appears to be in the periumbilical region where there is  mild twisting of the small bowel but no discrete volvulus or soft tissue mass.The colon is normal in caliber. There is stool throughout the colon. There is strong concern of an infiltrating mass involving the right colon, measuring up to 6.1 cm transverse on image 47. There are prominent adjacent lymph nodes or peritoneal nodules, described below. No evidence of  bowel perforation.  Vascular/Lymphatic: Stable atherosclerosis of the aorta, its branches and the iliac arteries. The left renal vein is circumaortic. There are multiple prominent lymph nodes around the right colon, including a 12 mm nodule lateral to it on image 42. Prominent ileocecal mesenteric nodes measure up to 8 mm on image 51. There is a prominent central mesenteric node measuring 9 mm on image 53. There is a small amount of perihepatic and pelvic ascites. There is no generalized peritoneal nodularity.  Reproductive: Stable uterine atrophy.  No adnexal mass.  Other: Stable postsurgical changes within the low anterior abdominal wall. No hernia identified.  Musculoskeletal: No acute or significant osseous findings. Moderate lumbar spondylosis appears unchanged.  IMPRESSION: 1. Recurrent high-grade mid to distal small bowel obstruction with transition point in the periumbilical region. 2. Strong concern of infiltrating mass involving the right colon, highly suspicious for colon cancer. There are adjacent prominent mesenteric lymph nodes and/or peritoneal nodules concerning for metastatic disease. No distant metastases identified. This colon mass does not appear to be the etiology for the bowel obstruction. 3. Small amount of free pelvic fluid without generalized peritoneal nodularity. 4. Generally stable biliary dilatation status post cholecystectomy.   Electronically Signed   By: Richardean Sale M.D.   On: 05/30/2015 16:54    Medications: . cefTRIAXone (ROCEPHIN)  IV  1 g Intravenous Q24H  . heparin  5,000 Units Subcutaneous 3 times per day  . levETIRAcetam  500 mg Intravenous Q12H  . sodium chloride  3 mL Intravenous Q12H    Assessment/Plan Right colon mass SBO  Mental retardation,  Hx of seizure UTI  Dyslipidemia Antibiotics: Day 3 Rocephin DVT:  Heparin/SCD   Plan:  Gi is waiting on SBO to improve so they can do bowel prep and colonoscopy.  This is my first exam but she seems pretty  comfortable now.  Film is pending this AM. Will continue to follow with you.     LOS: 2 days    Khyson Sebesta 06/01/2015

## 2015-06-01 NOTE — Progress Notes (Signed)
    Progress Note   Subjective  no nausea or abdominal pain   Objective   Vital signs in last 24 hours: Temp:  [98.1 F (36.7 C)-98.2 F (36.8 C)] 98.2 F (36.8 C) (06/07 0547) Pulse Rate:  [93-100] 100 (06/07 0547) Resp:  [18-20] 18 (06/07 0547) BP: (119-135)/(49-70) 119/49 mmHg (06/07 0547) SpO2:  [92 %-97 %] 94 % (06/07 0547) Last BM Date: 06/01/15 General:    white female in NAD Abdomen:  Soft, nontender, mildly distended with a few bowel sounds.  Psych:  Alert, minimal eye contact. Interacts minimally   Lab Results:  Recent Labs  05/30/15 1211 05/31/15 0510  WBC 14.2* 8.6  HGB 11.3* 10.0*  HCT 36.4 32.5*  PLT 476* 386   BMET  Recent Labs  05/30/15 1211 05/31/15 0510  NA 141 140  K 4.0 4.0  CL 107 104  CO2 24 26  GLUCOSE 152* 142*  BUN 23* 25*  CREATININE 0.73 0.65  CALCIUM 9.3 8.8*   LFT  Recent Labs  05/30/15 1211  PROT 7.6  ALBUMIN 3.4*  AST 30  ALT 17  ALKPHOS 54  BILITOT <0.1*      Assessment / Plan:    69 year old female with recurrent small bowel obstruction probably secondary to adhesions. There is also suggestion of right colon mass on CTscan (appears unrelated to SBO). Patient will need colonoscopy when SBO resolves and she can tolerate prep. Surgery following, anticipates need for colonic resection this admission. No nausea / vomiting today. Repeat abdominal films ordered for today but not yet done. Await results. If not improved will need NGT.   2. Mental retardation. Will need to get colonoscopy consent from POA    LOS: 2 days   Tye Savoy  06/01/2015, 9:28 AM     Attending physician's note   I have taken an interval history, reviewed the chart and examined the patient. I agree with the Advanced Practitioner's note, impression and recommendations. Clinically stable. Await repeat abd films.   [name]. Fuller Plan, MD [facility]

## 2015-06-02 ENCOUNTER — Inpatient Hospital Stay (HOSPITAL_COMMUNITY): Payer: Medicare Other

## 2015-06-02 LAB — CBC
HCT: 31.2 % — ABNORMAL LOW (ref 36.0–46.0)
Hemoglobin: 10 g/dL — ABNORMAL LOW (ref 12.0–15.0)
MCH: 28.6 pg (ref 26.0–34.0)
MCHC: 32.1 g/dL (ref 30.0–36.0)
MCV: 89.1 fL (ref 78.0–100.0)
Platelets: 291 10*3/uL (ref 150–400)
RBC: 3.5 MIL/uL — ABNORMAL LOW (ref 3.87–5.11)
RDW: 13.5 % (ref 11.5–15.5)
WBC: 4.8 10*3/uL (ref 4.0–10.5)

## 2015-06-02 LAB — BASIC METABOLIC PANEL
ANION GAP: 9 (ref 5–15)
BUN: 9 mg/dL (ref 6–20)
CHLORIDE: 105 mmol/L (ref 101–111)
CO2: 24 mmol/L (ref 22–32)
Calcium: 8 mg/dL — ABNORMAL LOW (ref 8.9–10.3)
Creatinine, Ser: 0.52 mg/dL (ref 0.44–1.00)
GFR calc non Af Amer: >60 mL/min (ref >60)
Glucose, Bld: 91 mg/dL (ref 65–99)
POTASSIUM: 3.2 mmol/L — AB (ref 3.5–5.1)
Sodium: 138 mmol/L (ref 135–145)

## 2015-06-02 MED ORDER — HYDRALAZINE HCL 20 MG/ML IJ SOLN: 5.0000 mg | Freq: Four times a day (QID) | INTRAMUSCULAR | Status: DC | PRN

## 2015-06-02 MED ORDER — SODIUM CHLORIDE 0.9 % IV SOLN
1000.0000 mg | Freq: Two times a day (BID) | INTRAVENOUS | Status: DC
  Administered 2015-06-02 – 2015-06-11 (×20): 1000 mg via INTRAVENOUS
  Filled 2015-06-02 (×21): qty 10

## 2015-06-02 MED ORDER — POTASSIUM CHLORIDE IN NACL 40-0.9 MEQ/L-% IV SOLN
INTRAVENOUS | Status: DC
  Administered 2015-06-02 – 2015-06-06 (×6): 50 mL/h via INTRAVENOUS
  Filled 2015-06-02 (×8): qty 1000

## 2015-06-02 NOTE — Progress Notes (Signed)
[place] for assistance with IV Keppra Dosing Indication: Hx seizure disorders; currently NPO  No Known Allergies  Patient Measurements: Height: 5\' 4"  (162.6 cm) Weight: 200 lb 8 oz (90.946 kg) IBW/kg (Calculated) : 54.7   Vital Signs: Temp: 98.2 F (36.8 C) (06/08 0500) Temp Source: Oral (06/08 0500) BP: 177/91 mmHg (06/08 0500) Pulse Rate: 100 (06/08 0500) Intake/Output from previous day: 06/07 0701 - 06/08 0700 In: 2085 [I.V.:1875; IV Piggyback:210] Out: -  Intake/Output from this shift:    Labs:  Recent Labs  05/30/15 1211 05/31/15 0510 06/02/15 0510  WBC 14.2* 8.6 4.8  HGB 11.3* 10.0* 10.0*  HCT 36.4 32.5* 31.2*  PLT 476* 386 291  CREATININE 0.73 0.65 0.52  ALBUMIN 3.4*  --   --   PROT 7.6  --   --   AST 30  --   --   ALT 17  --   --   ALKPHOS 64  --   --   BILITOT <0.1*  --   --    Estimated Creatinine Clearance: 71.5 mL/min (by C-G formula based on Cr of 0.52).     Medical History: Past Medical History  Diagnosis Date  . Hypercholesteremia   . Constipation   . Osteoporosis   . Seizure   . Edema   . Mental retardation   . SBO (small bowel obstruction) 11/2013, 05/2015    likely due to adhesions  . Colonic mass 05/2015    right, concerning for cancer    Medications:  Prescriptions prior to admission  Medication Sig Dispense Refill Last Dose  . atorvastatin (LIPITOR) 10 MG tablet Take 10 mg by mouth at bedtime.    05/29/2015 at Unknown time  . bisacodyl (DULCOLAX) 5 MG EC tablet Take 5 mg by mouth daily as needed for mild constipation or moderate constipation.   unknown  . calcium-vitamin D (OSCAL WITH D) 500-200 MG-UNIT per tablet Take 1 tablet by mouth 2 (two) times daily.    05/30/2015 at 0800  . docusate sodium (COLACE) 100 MG capsule Take 100 mg by mouth 2 (two) times daily.   05/30/2015 at 0800  . fenofibrate (TRICOR) 145 MG tablet Take 145 mg by mouth every morning.    05/30/2015 at 0800  . ferrous  fumarate (HEMOCYTE - 106 MG FE) 325 (106 FE) MG TABS tablet Take 1 tablet by mouth 2 (two) times daily.   05/30/2015 at 0800  . furosemide (LASIX) 20 MG tablet Take 20 mg by mouth every morning.    05/30/2015 at 0800  . lactulose, encephalopathy, (CHRONULAC) 10 GM/15ML SOLN Take 10 g by mouth 2 (two) times daily as needed (constipation).   05/30/2015 at 0800  . metroNIDAZOLE (METROGEL) 1 % gel Apply 1 application topically daily. Started 6.1.16   05/30/2015 at 0800  . potassium chloride SA (K-DUR,KLOR-CON) 20 MEQ tablet Take 20 mEq by mouth daily.    05/30/2015 at 0800  . Vitamin D, Ergocalciferol, (DRISDOL) 50000 UNITS CAPS capsule Take 50,000 Units by mouth every 7 (seven) days. Friday   05/28/2015 at Unknown time  . felbamate (FELBATOL) 600 MG tablet Take 1,200 mg by mouth 2 (two) times daily.    Completed Course at 0800    Assessment: 69 y/o F with PMH that includes seizure disorder and mental retardation, on felbamate prior to admission, now with SBO and unable to tolerate oral medications.  Unfortunately there is no IV form of felbamate available.  Discussed with attending  MD 6/6 - decision was made to use IV Keppra for now and pharmacy assistance with dosing was requested.   Today, 06/02/2015: On Keppra 500 mg IV q12h since 05/31/15.  No adverse effects reported. Because of seizure risk while patient off PO felbamate, will advance Keppra dosage to goal regimen today.   Goal of Therapy:  Seizure prevention  Plan:  1. Advance Keppra dosage to 1 gram IV q12h. 2. Follow clinical course. 3. When bowel obstruction resolves, resume felbamate PO or via tube at PTA dosage (1200 mg BID).  Clayburn Pert, PharmD, BCPS Pager: [phone] 06/02/2015  7:52 AM

## 2015-06-02 NOTE — Clinical Social Work Note (Signed)
Clinical Social Work Assessment  Patient Details  Name: [name] MRN: [id number] Date of Birth: 04/30/1946  Date of referral:  06/02/15               Reason for consult:  Facility Placement                Permission sought to share information with:  Family Supports, Chartered certified accountant granted to share information::  Yes, Verbal Permission Granted  Name::        Agency::     Relationship::     Contact Information:     Housing/Transportation Living arrangements for the past 2 months:  Group Home Source of Information:  Facility Patient Interpreter Needed:  None Criminal Activity/Legal Involvement Pertinent to Current Situation/Hospitalization:  No - Comment as needed Significant Relationships:  Siblings, [employer] Lives with:    Do you feel safe going back to the place where you live?  Yes Need for family participation in patient care:  Yes (Comment)  Care giving concerns:  Patient admitted from [facility]   Social Worker assessment / plan:  CSW contacted patient's next of kin who shared that she has lived at [facility] for over 20 years. Plans are for her to return there at [place] spoke with Group Home rep who concurs and requests updates as well as FL2 at time of dc.   Employment status:  Disabled (Comment on whether or not currently receiving Disability) Insurance information:  Medicare, Medicaid In [place] PT Recommendations:  Not assessed at this time Information / Referral to community resources:     Patient/Family's Response to care:  Family involved and assisting with treatment plans at this time.  Patient/Family's Understanding of and Emotional Response to Diagnosis, Current Treatment, and Prognosis:  Patient unable to participate in discussion but her family is involved and agreeable to the plans for treatment here.   Emotional Assessment Appearance:  Appears older than stated age Attitude/Demeanor/Rapport:   Unable to Assess Affect (typically observed):  Unable to Assess Orientation:  Oriented to Self Alcohol / Substance use:  Not Applicable Psych involvement (Current and /or in the community):  No (Comment)  Discharge Needs  Concerns to be addressed:    Readmission within the last 30 days:  No Current discharge risk:  None Barriers to Discharge:  No Barriers Identified   Margaret Clarks, LCSW 06/02/2015, 12:32 PM

## 2015-06-02 NOTE — Progress Notes (Addendum)
    Progress Note   Subjective  No complaints   Objective   Vital signs in last 24 hours: Temp:  [97.8 F (36.6 C)-98.5 F (36.9 C)] 98.2 F (36.8 C) (06/08 0500) Pulse Rate:  [99-102] 100 (06/08 0500) Resp:  [18-20] 18 (06/08 0500) BP: (122-177)/(61-91) 177/91 mmHg (06/08 0500) SpO2:  [97 %-98 %] 98 % (06/08 0500) Last BM Date: 06/23/15 General:    white female in NAD Abdomen:  Soft, nontender and nondistended. Normal bowel sounds. Extremities:  Without edema. Neurologic:  Alert and oriented,  grossly normal neurologically. Psych:  Flat affect, answers self questions with one word answers. Poor eye contact.   Intake/Output from previous day: June 23, 2023 0701 - 06/08 0700 In: 2085 [I.V.:1875; IV Piggyback:210] Out: -  Intake/Output this shift:    Lab Results:  Recent Labs  05/30/15 1211 05/31/15 0510 06/02/15 0510  WBC 14.2* 8.6 4.8  HGB 11.3* 10.0* 10.0*  HCT 36.4 32.5* 31.2*  PLT 476* 386 291   BMET  Recent Labs  05/30/15 1211 05/31/15 0510 06/02/15 0510  NA 141 140 138  K 4.0 4.0 3.2*  CL 107 104 105  CO2 24 26 24   GLUCOSE 152* 142* 91  BUN 23* 25* 9  CREATININE 0.73 0.65 0.52  CALCIUM 9.3 8.8* 8.0*   LFT  Recent Labs  05/30/15 1211  PROT 7.6  ALBUMIN 3.4*  AST 30  ALT 17  ALKPHOS 64  BILITOT <0.1*   Studies/Results: Dg Abd 2 Views  06/23/15   CLINICAL DATA:  Small bowel obstruction. Abdominal pain and distention.  EXAM: ABDOMEN - 2 VIEW  COMPARISON:  CT topogram on 05/30/2015  FINDINGS: Multiple moderately dilated small bowel loops are again seen containing air-fluid levels, without significant change compared to prior exam. Some gas and oral contrast is seen within nondilated colon. This is consistent with a high-grade partial small bowel obstruction. No evidence of free intraperitoneal air. Right upper quadrant surgical clips seen from prior cholecystectomy.  IMPRESSION: High-grade partial small bowel obstruction, without significant change  compared to recent CT.   Electronically Signed   By: Earle Gell M.D.   On: Jun 23, 2015 10:59      Assessment / Plan:   51. 69 year old female with recurrent small bowel obstruction probably secondary to adhesions. There is also suggestion of right colon mass on CTscan (appears unrelated to SBO). Yesterday's abdominal films show persistent high grade obstruction. Abdominal exam is not overly impressive however. Repeat films are pending. Will retry NGT for decompression.   2. Mental retardation. Will need to get colonoscopy consent from POA    LOS: 3 days   [name]  06/02/2015, 9:48 AM      Attending physician's note   I have taken an interval history, reviewed the chart and examined the patient. I agree with the Advanced Practitioner's note, impression and recommendations. Right colon mass on CT. Persistent SBO. Abd films from today reviewed-dilated loops of SB up to 6 cm and slight improvement of SBO compared to yesterday. NGT has been placed. No plan for colonoscopy until SBO has completely resolved and she can tolerate a bowel prep.   [name]. Fuller Plan, MD [facility]

## 2015-06-02 NOTE — Progress Notes (Signed)
Patient ID: [name], female   DOB: [birth year], 69 y.o.   MRN: [id number]  [facility] PROGRESS NOTE  [name] DTO:[id number] DOB: 12/25/1946 DOA: 05/30/2015 PCP: Pcp Not In System   Brief narrative:    69 year old female with hyperlipidemia, osteoporosis, prior history of small bowel obstruction, constipation was brought to ED with main concern of several days duration of progressively worsening abd pain, per family member described as constant and throbbing, unknown severity, associated with poor oral intake, nausea and non bloody vomiting.   CT abdomen and pelvis showed recurrent high-grade mid to distal small bowel obstruction with transition point in the periumbilical region, infiltrating mass involving the right colon highly suspicious for colon cancer, at bedtime and prominent mesenteric lymph nodes and/or peritoneal nodules concerning for metastatic disease.   Assessment/Plan:    High-grade SBO (small bowel obstruction): Also new diagnosis of colon mass - Continue NPO, IV fluid hydration, patient refused NG tube - denies any further nausea, vomiting or abdominal pain, CEA elevated at 27.1 - Appreciating GI and surgery following, abdominal x-ray 6/7 with persistent SBO with no changes - per GI team, no plan for colonoscopy until SBO resolved - attempt NGT placement   Active Problems:  Mental retardation - currently at her baseline mental status   HLD (hyperlipidemia) - Currently NPO    Hypokalemia - supplement and check BMP in AM - also check Mg level, keep K ~4 and Mg ~2    Accelerated HTN - place on Hydralazine as needed for now   UTI - Urine culture showed only 35,000 colonies, currently on IV Rocephin, remains afebrile     History of seizures - Unfortunately, no IV substitute of Felbamate, discussed with pharmacy and placed on IV Keppra for now     Obesity  - Body mass index is 34.4 kg/(m^2).  DVT prophylaxis - Heparin SQ  Code Status: Full.   Family Communication:  plan of care discussed with the patient, no family at bedside  Disposition Plan: Not ready for d/c  IV access:  Peripheral IV  Procedures and diagnostic studies:    Dg Chest 2 View 05/30/2015   Cardiomegaly with vascular congestion and basilar atelectasis.  Marked gastric distention.     Dg Abd 1 View 06/02/2015  Enteric tube tip likely in the gastric fundus. 2. Moderate small bowel dilatation consistent with obstruction, improved   Ct Abdomen Pelvis W Contrast 05/30/2015  Recurrent high-grade mid to distal small bowel obstruction with transition point in the periumbilical region. 2. Strong concern of infiltrating mass involving the right colon, highly suspicious for colon cancer. There are adjacent prominent mesenteric lymph nodes and/or peritoneal nodules concerning for metastatic disease. No distant metastases identified. This colon mass does not appear to be the etiology for the bowel obstruction. 3. Small amount of free pelvic fluid without generalized peritoneal nodularity. 4. Generally stable biliary dilatation status post cholecystectomy.   E  Dg Abd 2 Views 06/01/2015  High-grade partial small bowel obstruction, without significant change compared to recent CT.     Medical Consultants:  GI Surgery   Other Consultants:  PT  IAnti-Infectives:   Rocephin 6/5 -->  Faye Ramsay, MD  [facility] Pager [phone]  If 7PM-7AM, please contact night-coverage [email or web address] Password [email or web address] 06/02/2015, 1:34 PM   LOS: 3 days   HPI/Subjective: No events overnight.   Objective: Filed Vitals:   06/01/15 0547 06/01/15 1305 06/01/15 2103 06/02/15 0500  BP: 119/49 122/61 161/88 177/91  Pulse: 100 99 102 100  Temp: 98.2 F (36.8 C) 98.5 F (36.9 C) 97.8 F (36.6 C) 98.2 F (36.8 C)  TempSrc: Oral Oral Oral Oral  Resp: 18 20 18 18   Height:      Weight:      SpO2: 94% 97% 98% 98%    Intake/Output Summary (Last 24 hours) at 06/02/15 1334 Last data filed at  06/02/15 0600  Gross per 24 hour  Intake   2085 ml  Output      0 ml  Net   2085 ml    Exam:   General:  Pt is alert, follows commands appropriately, not in acute distress  Cardiovascular: Regular rhythm, tachy, S1/S2, no rubs, no gallops  Respiratory: Clear to auscultation bilaterally, no wheezing, no crackles, no rhonchi  Abdomen: Soft, non tender, slightly distended, very faint BS  Data Reviewed: Basic Metabolic Panel:  Recent Labs Lab 05/30/15 1211 05/31/15 0510 06/02/15 0510  NA 141 140 138  K 4.0 4.0 3.2*  CL 107 104 105  CO2 24 26 24   GLUCOSE 152* 142* 91  BUN 23* 25* 9  CREATININE 0.73 0.65 0.52  CALCIUM 9.3 8.8* 8.0*   Liver Function Tests:  Recent Labs Lab 05/30/15 1211  AST 30  ALT 17  ALKPHOS 64  BILITOT <0.1*  PROT 7.6  ALBUMIN 3.4*    Recent Labs Lab 05/30/15 1211  LIPASE 11*   CBC:  Recent Labs Lab 05/30/15 1211 05/31/15 0510 06/02/15 0510  WBC 14.2* 8.6 4.8  NEUTROABS 12.3*  --   --   HGB 11.3* 10.0* 10.0*  HCT 36.4 32.5* 31.2*  MCV 87.9 88.8 89.1  PLT 476* 386 291    Recent Results (from the past 240 hour(s))  Urine culture     Status: None   Collection Time: 05/30/15 11:35 AM  Result Value Ref Range Status   Specimen Description URINE, CLEAN CATCH  Final   Special Requests NONE  Final   Colony Count   Final    35,000 COLONIES/ML Performed at [facility]    Culture   Final    Multiple bacterial morphotypes present, none predominant. Suggest appropriate recollection if clinically indicated. Performed at [facility]    Report Status 05/31/2015 FINAL  Final  Urine culture     Status: None   Collection Time: 05/31/15  3:27 AM  Result Value Ref Range Status   Specimen Description URINE, RANDOM  Final   Special Requests NONE  Final   Colony Count NO GROWTH Performed at [facility]   Final   Culture NO GROWTH Performed at [facility]   Final   Report Status 06/01/2015 FINAL   Final  Clostridium Difficile by PCR (not at [facility])     Status: None   Collection Time: 06/01/15  5:05 PM  Result Value Ref Range Status   C difficile by pcr NEGATIVE NEGATIVE Final     Scheduled Meds: . cefTRIAXone (ROCEPHIN)  IV  1 g Intravenous Q24H  . heparin  5,000 Units Subcutaneous 3 times per day  . levETIRAcetam  1,000 mg Intravenous Q12H  . sodium chloride  3 mL Intravenous Q12H   Continuous Infusions: . sodium chloride Stopped (05/30/15 2314)  . sodium chloride 75 mL/hr at 06/02/15 1206

## 2015-06-02 NOTE — Progress Notes (Signed)
  Subjective: No complaints of pain, she knows she is at [facility].  She ask about eating.  She has a [place] bear, and says it's her baby.  Seems very comfortable.  Objective: Vital signs in last 24 hours: Temp:  [97.8 F (36.6 C)-98.5 F (36.9 C)] 98.2 F (36.8 C) (06/08 0500) Pulse Rate:  [99-102] 100 (06/08 0500) Resp:  [18-20] 18 (06/08 0500) BP: (122-177)/(61-91) 177/91 mmHg (06/08 0500) SpO2:  [97 %-98 %] 98 % (06/08 0500) Last BM Date: Jun 16, 2015 9 stools recorded  -  No bowel prep, this was on her own, I confirmed this is correct with nurse. NPO Afebrile, BP up some K+3.2  Intake/Output from previous day: 06/16/2023 0701 - 06/08 0700 In: 2085 [I.V.:1875; IV Piggyback:210] Out: -  Intake/Output this shift:    General appearance: alert, cooperative and no distress GI: soft, non-tender; bowel sounds normal; no masses,  no organomegaly  Lab Results:   Recent Labs  05/31/15 0510 06/02/15 0510  WBC 8.6 4.8  HGB 10.0* 10.0*  HCT 32.5* 31.2*  PLT 386 291    BMET  Recent Labs  05/31/15 0510 06/02/15 0510  NA 140 138  K 4.0 3.2*  CL 104 105  CO2 26 24  GLUCOSE 142* 91  BUN 25* 9  CREATININE 0.65 0.52  CALCIUM 8.8* 8.0*   PT/INR No results for input(s): LABPROT, INR in the last 72 hours.   Recent Labs Lab 05/30/15 1211  AST 30  ALT 17  ALKPHOS 64  BILITOT <0.1*  PROT 7.6  ALBUMIN 3.4*     Lipase     Component Value Date/Time   LIPASE 11* 05/30/2015 1211     Studies/Results: Dg Abd 2 Views  06-16-15   CLINICAL DATA:  Small bowel obstruction. Abdominal pain and distention.  EXAM: ABDOMEN - 2 VIEW  COMPARISON:  CT topogram on 05/30/2015  FINDINGS: Multiple moderately dilated small bowel loops are again seen containing air-fluid levels, without significant change compared to prior exam. Some gas and oral contrast is seen within nondilated colon. This is consistent with a high-grade partial small bowel obstruction. No evidence of free  intraperitoneal air. Right upper quadrant surgical clips seen from prior cholecystectomy.  IMPRESSION: High-grade partial small bowel obstruction, without significant change compared to recent CT.   Electronically Signed   By: Earle Gell M.D.   On: 2015/06/16 10:59    Medications: . cefTRIAXone (ROCEPHIN)  IV  1 g Intravenous Q24H  . heparin  5,000 Units Subcutaneous 3 times per day  . levETIRAcetam  1,000 mg Intravenous Q12H  . sodium chloride  3 mL Intravenous Q12H   . sodium chloride Stopped (05/30/15 2314)  . sodium chloride 75 mL/hr at 06/16/15 1701    Assessment/Plan Right colon mass SBO  Mental retardation,  Hx of seizure UTI Dyslipidemia Antibiotics: Day 3 Rocephin DVT: Heparin/SCD    Plan:  Film yesterday showed ongoing obstruction, today's exam does not show any signs of obstruction, I will order another film.  If they are not going to do colonoscopy today I would let her have some clears.  She will need K+ replaced, but I will defer to Medicine.      LOS: 3 days    Steele Stracener 06/02/2015

## 2015-06-02 NOTE — Evaluation (Signed)
  Occupational Therapy Evaluation Patient Details Name: [name] MRN: [id number] DOB: 1946-01-10 Today's Date: 2015/06/05    History of Present Illness 69 year old female with recurrent small bowel obstruction probably secondary to adhesions. Pt with MR and was I at group home   Clinical Impression   Pt admitted with SBO. Pt currently with functional limitations due to the deficits listed below (see OT Problem List).  Pt will benefit from skilled OT to increase their safety and independence with ADL and functional mobility for ADL to facilitate discharge to venue listed below.      Follow Up Recommendations  Supervision - Intermittent          Precautions / Restrictions Precautions Precautions: Fall      Mobility Bed Mobility        I          Transfers        overall min A. VC to push up with arms and reach back                    ADL Overall ADL's : Needs assistance/impaired     Grooming: Minimal assistance;Sitting               Lower Body Dressing: Moderate assistance;Sit to/from stand   Toilet Transfer: Minimal assistance;BSC;RW   Toileting- Clothing Manipulation and Hygiene: Minimal assistance;Sit to/from stand         General ADL Comments: pt needed max VC. Pt with very flat affect.  Pt did speak occasionally               Pertinent Vitals/Pain Pain Assessment: No/denies pain        Extremity/Trunk Assessment Upper Extremity Assessment Upper Extremity Assessment: Generalized weakness              Cognition     Overall Cognitive Status: History of cognitive impairments - at baseline                                Home Living Family/patient expects to be discharged to:: Group home                                        Prior Functioning/Environment       pt was I at group home        OT Diagnosis: Generalized weakness   OT Problem List: Decreased strength;Decreased activity  tolerance   OT Treatment/Interventions: Self-care/ADL training;DME and/or AE instruction;Patient/family education    OT Goals(Current goals can be found in the care plan section) Acute Rehab OT Goals Patient Stated Goal: go home OT Goal Formulation: With patient Time For Goal Achievement: 06/16/15 Potential to Achieve Goals: Good  OT Frequency: Min 2X/week              End of Session Nurse Communication: Mobility status  Activity Tolerance: Patient limited by fatigue Patient left: in bed;with call bell/phone within reach;with nursing/sitter in room;with bed alarm set   Time: 1005-1029 OT Time Calculation (min): 24 min Charges:  OT General Charges $OT Visit: 1 Procedure OT Evaluation $Initial OT Evaluation Tier I: 1 Procedure OT Treatments $Self Care/Home Management : 8-22 mins G-Codes:    Payton Mccallum D 05-Jun-2015, 10:35 AM

## 2015-06-03 ENCOUNTER — Inpatient Hospital Stay (HOSPITAL_COMMUNITY): Payer: Medicare Other

## 2015-06-03 LAB — CBC
HEMATOCRIT: 31.8 % — AB (ref 36.0–46.0)
Hemoglobin: 10.1 g/dL — ABNORMAL LOW (ref 12.0–15.0)
MCH: 27.4 pg (ref 26.0–34.0)
MCHC: 31.8 g/dL (ref 30.0–36.0)
MCV: 86.2 fL (ref 78.0–100.0)
PLATELETS: 329 10*3/uL (ref 150–400)
RBC: 3.69 MIL/uL — ABNORMAL LOW (ref 3.87–5.11)
RDW: 13.2 % (ref 11.5–15.5)
WBC: 7.6 10*3/uL (ref 4.0–10.5)

## 2015-06-03 LAB — BASIC METABOLIC PANEL
ANION GAP: 13 (ref 5–15)
BUN: 7 mg/dL (ref 6–20)
CO2: 20 mmol/L — AB (ref 22–32)
Calcium: 8.2 mg/dL — ABNORMAL LOW (ref 8.9–10.3)
Chloride: 103 mmol/L (ref 101–111)
Creatinine, Ser: 0.55 mg/dL (ref 0.44–1.00)
GFR calc Af Amer: >60 mL/min (ref >60)
GFR calc non Af Amer: >60 mL/min (ref >60)
Glucose, Bld: 82 mg/dL (ref 65–99)
Potassium: 3 mmol/L — ABNORMAL LOW (ref 3.5–5.1)
SODIUM: 136 mmol/L (ref 135–145)

## 2015-06-03 LAB — MAGNESIUM: Magnesium: 1.7 mg/dL (ref 1.7–2.4)

## 2015-06-03 MED ORDER — FLEET ENEMA 7-19 GM/118ML RE ENEM
2.0000 | ENEMA | Freq: Once | RECTAL | Status: AC
  Administered 2015-06-03: 2 via RECTAL
  Filled 2015-06-03: qty 2

## 2015-06-03 MED ORDER — MAGNESIUM SULFATE 2 GM/50ML IV SOLN
2.0000 g | Freq: Once | INTRAVENOUS | Status: AC
Start: 2015-06-03 — End: 2015-06-03
  Administered 2015-06-03: 2 g via INTRAVENOUS
  Filled 2015-06-03: qty 50

## 2015-06-03 MED ORDER — POTASSIUM CHLORIDE 10 MEQ/100ML IV SOLN
10.0000 meq | INTRAVENOUS | Status: AC
  Administered 2015-06-03 (×6): 10 meq via INTRAVENOUS
  Filled 2015-06-03 (×6): qty 100

## 2015-06-03 NOTE — Progress Notes (Signed)
Subjective: Cannister suction not connected correctly but I did not get much back.  She said she had pain once, yesterday.  Small BM yesterday.  Her abdomen is soft on palpation right now.  Objective: Vital signs in last 24 hours: Temp:  [98.3 F (36.8 C)-98.9 F (37.2 C)] 98.3 F (36.8 C) (06/09 0547) Pulse Rate:  [93-103] 103 (06/09 0547) Resp:  [18-20] 20 (06/09 0547) BP: (136-151)/(66-72) 143/66 mmHg (06/09 0547) SpO2:  [95 %-99 %] 95 % (06/09 0547) Last BM Date: 06/02/15 600 recorded from NG yesterday, most first shift, none day prior recorded. 9 stools recorded day prior, none yesterday Film yesterday shows NG in place with improved obstruction Afebrile, VSS K+ 3.0  I don't see where she got any K+ yesterday, IV with 40 K+@ 50 ml per hour WBC is normal Intake/Output from previous day: 06/08 0701 - 06/09 0700 In: 100 [I.V.:100] Out: 600 [Emesis/NG output:600] Intake/Output this shift:    General appearance: alert, cooperative and no distress GI: soft, non-tender; bowel sounds normal; no masses,  no organomegaly  Lab Results:   Recent Labs  06/02/15 0510 06/03/15 0540  WBC 4.8 7.6  HGB 10.0* 10.1*  HCT 31.2* 31.8*  PLT 291 329    BMET  Recent Labs  06/02/15 0510 06/03/15 0540  NA 138 136  K 3.2* 3.0*  CL 105 103  CO2 24 20*  GLUCOSE 91 82  BUN 9 7  CREATININE 0.52 0.55  CALCIUM 8.0* 8.2*   PT/INR No results for input(s): LABPROT, INR in the last 72 hours.   Recent Labs Lab 05/30/15 1211  AST 30  ALT 17  ALKPHOS 64  BILITOT <0.1*  PROT 7.6  ALBUMIN 3.4*     Lipase     Component Value Date/Time   LIPASE 11* 05/30/2015 1211     Studies/Results: Dg Abd 1 View  06/02/2015   CLINICAL DATA:  Small bowel obstruction.  NG tube placement.  EXAM: ABDOMEN - 1 VIEW  COMPARISON:  06/01/2015  FINDINGS: An enteric tube has been placed with tip projecting in the left upper quadrant likely in the gastric fundus and with side hole near the GE  junction. There is moderate dilatation of small bowel loops in the central abdomen measuring up to 6 cm in diameter, improved from the prior study. Stool and residual contrast are noted in the colon, which is nondilated. Right upper quadrant surgical clips are noted.  IMPRESSION: 1. Enteric tube tip likely in the gastric fundus. 2. Moderate small bowel dilatation consistent with obstruction, improved from prior.   Electronically Signed   By: Logan Bores   On: 06/02/2015 12:55   Dg Abd 2 Views  06/01/2015   CLINICAL DATA:  Small bowel obstruction. Abdominal pain and distention.  EXAM: ABDOMEN - 2 VIEW  COMPARISON:  CT topogram on 05/30/2015  FINDINGS: Multiple moderately dilated small bowel loops are again seen containing air-fluid levels, without significant change compared to prior exam. Some gas and oral contrast is seen within nondilated colon. This is consistent with a high-grade partial small bowel obstruction. No evidence of free intraperitoneal air. Right upper quadrant surgical clips seen from prior cholecystectomy.  IMPRESSION: High-grade partial small bowel obstruction, without significant change compared to recent CT.   Electronically Signed   By: Earle Gell M.D.   On: 06/01/2015 10:59    Medications: . cefTRIAXone (ROCEPHIN)  IV  1 g Intravenous Q24H  . heparin  5,000 Units Subcutaneous 3 times per day  .  levETIRAcetam  1,000 mg Intravenous Q12H  . sodium chloride  3 mL Intravenous Q12H  . sodium phosphate  2 enema Rectal Once   . 0.9 % NaCl with KCl 40 mEq / L 50 mL/hr (06/03/15 0021)   Assessment/Plan Right colon mass SBO  Mental retardation,  Hx of seizure UTI Dyslipidemia Antibiotics: Day 3 Rocephin DVT: Heparin/SCD    Plan:  K+ needs to be increased up to around 4.0.  I would try her on some sips with the ng clamped.  She would have to be up in chair  To do this to avoid aspiration issues,  If she did OK with that we could even give her some K+ thru NG.  She needs  colonoscopy prior to surgery.   Film is  Pending.  LOS: 4 days    Modesty Rudy 06/03/2015

## 2015-06-03 NOTE — Progress Notes (Addendum)
Patient ID: [name], female   DOB: April 06, 1946, 69 y.o.   MRN: [id number]  [facility] PROGRESS NOTE  [name] WUJ:[id number] DOB: 1946-09-01 DOA: 05/30/2015 PCP: Pcp Not In System   Brief narrative:    69 year old female with hyperlipidemia, osteoporosis, prior history of small bowel obstruction, constipation was brought to ED with main concern of several days duration of progressively worsening abd pain, per family member described as constant and throbbing, unknown severity, associated with poor oral intake, nausea and non bloody vomiting.   CT abdomen and pelvis showed recurrent high-grade mid to distal small bowel obstruction with transition point in the periumbilical region, infiltrating mass involving the right colon highly suspicious for colon cancer, at bedtime and prominent mesenteric lymph nodes and/or peritoneal nodules concerning for metastatic disease.   Major events since admission: 6/8 - NG tube placed 6/9 - slight improvement, electrolyte still depleted, needs more potassium  Assessment/Plan:    High-grade SBO (small bowel obstruction): Also new diagnosis of colon mass - Continue NPO, IV fluid hydration, NG tube placed 06/02/2015 - denies any further nausea, vomiting or abdominal pain, CEA elevated at 27.1 - Appreciating GI and surgery following, abdominal x-ray 6/7 with persistent SBO with no changes - per GI team, no plan for colonoscopy until SBO resolved - Continue to supplement electrolytes  Active Problems:  Mental retardation - currently at her baseline mental status   HLD (hyperlipidemia) - Currently NPO    Hypokalemia - Patient receiving KCl with IV fluids, will add additional KCl 6 runs - Mg is 1.7 so will give additional Mg 2 gm IV today  - also check Mg level, keep K ~4 and Mg ~2 - repeat BMP and Mg in a.m.     Accelerated HTN - placed on Hydralazine as needed and blood pressure better controlled   UTI - Urine culture showed  only 35,000 colonies, currently on IV Rocephin, remains afebrile  - Today is day 5 of IV Rocephin, stop after today's dose    History of seizures - Unfortunately, no IV substitute of Felbamate, discussed with pharmacy and placed on IV Keppra for now     Moderate protein calorie malnutrition in the context of acute illness     Obesity  - Body mass index is 34.4 kg/(m^2).  DVT prophylaxis - Heparin SQ  Code Status: Full.  Family Communication:  plan of care discussed with the patient, no family at bedside  Disposition Plan: Not ready for d/c  IV access:  Peripheral IV  Procedures and diagnostic studies:    Dg Chest 2 View 05/30/2015   Cardiomegaly with vascular congestion and basilar atelectasis.  Marked gastric distention.     Dg Abd 1 View 06/02/2015  Enteric tube tip likely in the gastric fundus. 2. Moderate small bowel dilatation consistent with obstruction, improved   Ct Abdomen Pelvis W Contrast 05/30/2015  Recurrent high-grade mid to distal small bowel obstruction with transition point in the periumbilical region. 2. Strong concern of infiltrating mass involving the right colon, highly suspicious for colon cancer. There are adjacent prominent mesenteric lymph nodes and/or peritoneal nodules concerning for metastatic disease. No distant metastases identified. This colon mass does not appear to be the etiology for the bowel obstruction. 3. Small amount of free pelvic fluid without generalized peritoneal nodularity. 4. Generally stable biliary dilatation status post cholecystectomy.   E  Dg Abd 2 Views 06/01/2015  High-grade partial small bowel obstruction, without significant change compared to recent CT.  Medical Consultants:  GI Surgery   Other Consultants:  PT  IAnti-Infectives:   Rocephin 6/5 --> 6/9  Faye Ramsay, MD  [facility] Pager [phone]  If 7PM-7AM, please contact night-coverage [email or web address] Password [email or web address] 06/03/2015, 10:39 AM   LOS: 4 days    HPI/Subjective: No events overnight.   Objective: Filed Vitals:   06/02/15 0500 06/02/15 1426 06/02/15 2054 06/03/15 0547  BP: 177/91 136/72 151/70 143/66  Pulse: 100 95 93 103  Temp: 98.2 F (36.8 C) 98.9 F (37.2 C) 98.4 F (36.9 C) 98.3 F (36.8 C)  TempSrc: Oral Oral Oral Oral  Resp: 18 18 20 20   Height:      Weight:      SpO2: 98% 99% 99% 95%    Intake/Output Summary (Last 24 hours) at 06/03/15 1039 Last data filed at 06/02/15 2300  Gross per 24 hour  Intake    100 ml  Output    600 ml  Net   -500 ml    Exam:   General:  Pt is alert, follows commands appropriately, not in acute distress  Cardiovascular: Regular rhythm, tachy, S1/S2, no rubs, no gallops  Respiratory: Clear to auscultation bilaterally, no wheezing, no crackles, no rhonchi  Abdomen: Soft, non tender, slightly distended, very faint BS  Data Reviewed: Basic Metabolic Panel:  Recent Labs Lab 05/30/15 1211 05/31/15 0510 06/02/15 0510 06/03/15 0540  NA 141 140 138 136  K 4.0 4.0 3.2* 3.0*  CL 107 104 105 103  CO2 24 26 24  20*  GLUCOSE 152* 142* 91 82  BUN 23* 25* 9 7  CREATININE 0.73 0.65 0.52 0.55  CALCIUM 9.3 8.8* 8.0* 8.2*  MG  --   --   --  1.7   Liver Function Tests:  Recent Labs Lab 05/30/15 1211  AST 30  ALT 17  ALKPHOS 64  BILITOT <0.1*  PROT 7.6  ALBUMIN 3.4*    Recent Labs Lab 05/30/15 1211  LIPASE 11*   CBC:  Recent Labs Lab 05/30/15 1211 05/31/15 0510 06/02/15 0510 06/03/15 0540  WBC 14.2* 8.6 4.8 7.6  NEUTROABS 12.3*  --   --   --   HGB 11.3* 10.0* 10.0* 10.1*  HCT 36.4 32.5* 31.2* 31.8*  MCV 87.9 88.8 89.1 86.2  PLT 476* 386 291 329    Recent Results (from the past 240 hour(s))  Urine culture     Status: None   Collection Time: 05/30/15 11:35 AM  Result Value Ref Range Status   Specimen Description URINE, CLEAN CATCH  Final   Special Requests NONE  Final   Colony Count   Final    35,000 COLONIES/ML Performed at [facility]     Culture   Final    Multiple bacterial morphotypes present, none predominant. Suggest appropriate recollection if clinically indicated. Performed at [facility]    Report Status 05/31/2015 FINAL  Final  Urine culture     Status: None   Collection Time: 05/31/15  3:27 AM  Result Value Ref Range Status   Specimen Description URINE, RANDOM  Final   Special Requests NONE  Final   Colony Count NO GROWTH Performed at [facility]   Final   Culture NO GROWTH Performed at [facility]   Final   Report Status 06/01/2015 FINAL  Final  Clostridium Difficile by PCR (not at [facility])     Status: None   Collection Time: 06/01/15  5:05 PM  Result Value Ref Range Status   C difficile  by pcr NEGATIVE NEGATIVE Final     Scheduled Meds: . cefTRIAXone (ROCEPHIN)  IV  1 g Intravenous Q24H  . heparin  5,000 Units Subcutaneous 3 times per day  . levETIRAcetam  1,000 mg Intravenous Q12H  . potassium chloride  10 mEq Intravenous Q1 Hr x 6  . sodium chloride  3 mL Intravenous Q12H  . sodium phosphate  2 enema Rectal Once   Continuous Infusions: . 0.9 % NaCl with KCl 40 mEq / L 50 mL/hr (06/03/15 0021)

## 2015-06-03 NOTE — Care Management Note (Signed)
Case Management Note  Patient Details  Name: [name] MRN: [id number] Date of Birth: [birth year]  Subjective/Objective:          69 yo female  Admitted with SBO         Action/Plan:  Patient resides at [facility] and CSW is working with patient for placement back in [place] upon dc. Will follow for other discharge needs. Expected Discharge Date:   (unknown)               Expected Discharge Plan:  Group Home  In-House Referral:  Clinical Social Work  Discharge planning Services  CM Consult  Post Acute Care Choice:    Choice offered to:     DME Arranged:    DME Agency:     HH Arranged:    HH Agency:     Status of Service:  In process, will continue to follow  Medicare Important Message Given:  Yes Date Medicare IM Given:  06/03/15 Medicare IM give by:  Leanne Chang Date Additional Medicare IM Given:    Additional Medicare Important Message give by:     If discussed at [place] of Stay Meetings, dates discussed:    Additional Comments:  Scot Dock, RN 06/03/2015, 11:00 AM

## 2015-06-03 NOTE — Progress Notes (Signed)
Patient ID: [name], female   DOB: August 04, 1946, 69 y.o.   MRN: [id number]    Progress Note   Subjective  Denies any pain, no appetite, says she had a BM NG output 600 cc past shift   Objective   Vital signs in last 24 hours: Temp:  [98.3 F (36.8 C)-98.9 F (37.2 C)] 98.3 F (36.8 C) (06/09 0547) Pulse Rate:  [93-103] 103 (06/09 0547) Resp:  [18-20] 20 (06/09 0547) BP: (136-151)/(66-72) 143/66 mmHg (06/09 0547) SpO2:  [95 %-99 %] 95 % (06/09 0547) Last BM Date: 06/02/15 General: older    white female in NAD Heart:  Regular rate and rhythm; no murmurs Lungs: Respirations even and unlabored, lungs CTA bilaterally Abdomen:  Soft, nontender and minimally distended, BS quiiet  Extremities:  Without edema. Neurologic:  Alert and oriented,  grossly normal neurologically. Psych:  Cooperative. Normal mood and affect.  Intake/Output from previous day: 06/08 0701 - 06/09 0700 In: 100 [I.V.:100] Out: 600 [Emesis/NG output:600] Intake/Output this shift:    Lab Results:  Recent Labs  06/02/15 0510 06/03/15 0540  WBC 4.8 7.6  HGB 10.0* 10.1*  HCT 31.2* 31.8*  PLT 291 329   BMET  Recent Labs  06/02/15 0510 06/03/15 0540  NA 138 136  K 3.2* 3.0*  CL 105 103  CO2 24 20*  GLUCOSE 91 82  BUN 9 7  CREATININE 0.52 0.55  CALCIUM 8.0* 8.2*    Studies/Results: Dg Abd 1 View  06/02/2015   CLINICAL DATA:  Small bowel obstruction.  NG tube placement.  EXAM: ABDOMEN - 1 VIEW  COMPARISON:  06/01/2015  FINDINGS: An enteric tube has been placed with tip projecting in the left upper quadrant likely in the gastric fundus and with side hole near the GE junction. There is moderate dilatation of small bowel loops in the central abdomen measuring up to 6 cm in diameter, improved from the prior study. Stool and residual contrast are noted in the colon, which is nondilated. Right upper quadrant surgical clips are noted.  IMPRESSION: 1. Enteric tube tip likely in the gastric fundus. 2.  Moderate small bowel dilatation consistent with obstruction, improved from prior.   Electronically Signed   By: Logan Bores   On: 06/02/2015 12:55   Dg Abd 2 Views  06/01/2015   CLINICAL DATA:  Small bowel obstruction. Abdominal pain and distention.  EXAM: ABDOMEN - 2 VIEW  COMPARISON:  CT topogram on 05/30/2015  FINDINGS: Multiple moderately dilated small bowel loops are again seen containing air-fluid levels, without significant change compared to prior exam. Some gas and oral contrast is seen within nondilated colon. This is consistent with a high-grade partial small bowel obstruction. No evidence of free intraperitoneal air. Right upper quadrant surgical clips seen from prior cholecystectomy.  IMPRESSION: High-grade partial small bowel obstruction, without significant change compared to recent CT.   Electronically Signed   By: Earle Gell M.D.   On: 06/01/2015 10:59       Assessment / Plan:   #1 69 yo female with SBO- NG placed yesterday- will repeat abdominal films this am #2  Probable Right colon cancer with nodal mets. Unclear whether or not obstruction somehow related, or separate process secondary to adhesions. Day #4 hospital stay- if abdominal films improved could start miralax in hopes of gently prepping her - if no improvement soon then needs surgery for both issues. Fleets enemas today to start prepping from below #3 UTI #4 MR   Principal Problem:  SBO (small bowel obstruction) Active Problems:   Mental retardation   Convulsions   HLD (hyperlipidemia)   Colonic mass   Small bowel obstruction   UTI (lower urinary tract infection)    LOS: 4 days   [name]  06/03/2015, 9:16 AM     Attending physician's note   I have taken an interval history, reviewed the chart and examined the patient. I agree with the Advanced Practitioner's note, impression and recommendations. Right colon mass and SBO. Abd exam unchanged. Await repeat abd films.   [name]. Fuller Plan, MD [facility]

## 2015-06-04 ENCOUNTER — Inpatient Hospital Stay (HOSPITAL_COMMUNITY): Payer: Medicare Other

## 2015-06-04 LAB — CBC
HEMATOCRIT: 32.9 % — AB (ref 36.0–46.0)
HEMATOCRIT: 32.6 % — AB (ref 36.0–46.0)
Hemoglobin: 10.3 g/dL — ABNORMAL LOW (ref 12.0–15.0)
Hemoglobin: 10.4 g/dL — ABNORMAL LOW (ref 12.0–15.0)
MCH: 27.4 pg (ref 26.0–34.0)
MCH: 27.9 pg (ref 26.0–34.0)
MCHC: 31.3 g/dL (ref 30.0–36.0)
MCHC: 31.9 g/dL (ref 30.0–36.0)
MCV: 87.5 fL (ref 78.0–100.0)
MCV: 87.4 fL (ref 78.0–100.0)
PLATELETS: 384 10*3/uL (ref 150–400)
Platelets: 381 10*3/uL (ref 150–400)
RBC: 3.76 MIL/uL — AB (ref 3.87–5.11)
RBC: 3.73 MIL/uL — AB (ref 3.87–5.11)
RDW: 13.5 % (ref 11.5–15.5)
RDW: 13.5 % (ref 11.5–15.5)
WBC: 6.8 10*3/uL (ref 4.0–10.5)
WBC: 7.5 10*3/uL (ref 4.0–10.5)

## 2015-06-04 LAB — BASIC METABOLIC PANEL
Anion gap: 15 (ref 5–15)
BUN: 6 mg/dL (ref 6–20)
CO2: 20 mmol/L — ABNORMAL LOW (ref 22–32)
Calcium: 8.3 mg/dL — ABNORMAL LOW (ref 8.9–10.3)
Chloride: 103 mmol/L (ref 101–111)
Creatinine, Ser: 0.54 mg/dL (ref 0.44–1.00)
GFR calc Af Amer: >60 mL/min (ref >60)
GLUCOSE: 77 mg/dL (ref 65–99)
POTASSIUM: 3.7 mmol/L (ref 3.5–5.1)
Sodium: 138 mmol/L (ref 135–145)

## 2015-06-04 LAB — MAGNESIUM: Magnesium: 1.9 mg/dL (ref 1.7–2.4)

## 2015-06-04 MED ORDER — MAGNESIUM SULFATE 2 GM/50ML IV SOLN
2.0000 g | Freq: Once | INTRAVENOUS | Status: AC
  Administered 2015-06-04: 2 g via INTRAVENOUS
  Filled 2015-06-04: qty 50

## 2015-06-04 MED ORDER — FLEET ENEMA 7-19 GM/118ML RE ENEM
1.0000 | ENEMA | Freq: Once | RECTAL | Status: AC
  Administered 2015-06-04: 1 via RECTAL
  Filled 2015-06-04: qty 1

## 2015-06-04 MED ORDER — POTASSIUM CHLORIDE 10 MEQ/100ML IV SOLN
10.0000 meq | INTRAVENOUS | Status: AC
  Administered 2015-06-04 (×3): 10 meq via INTRAVENOUS
  Filled 2015-06-04 (×3): qty 100

## 2015-06-04 MED ORDER — HYDRALAZINE HCL 20 MG/ML IJ SOLN
10.0000 mg | Freq: Four times a day (QID) | INTRAMUSCULAR | Status: DC | PRN
  Administered 2015-06-09: 10 mg via INTRAVENOUS
  Filled 2015-06-04: qty 1

## 2015-06-04 MED ORDER — POLYETHYLENE GLYCOL 3350 17 G PO PACK
17.0000 g | PACK | Freq: Two times a day (BID) | ORAL | Status: DC
  Administered 2015-06-04 (×2): 17 g via ORAL
  Filled 2015-06-04 (×4): qty 1

## 2015-06-04 NOTE — Progress Notes (Signed)
[place] for assistance with IV Keppra Dosing Indication: Hx seizure disorders; currently NPO  No Known Allergies  Patient Measurements: Height: 5\' 4"  (162.6 cm) Weight: 193 lb 9.6 oz (87.816 kg) IBW/kg (Calculated) : 54.7   Vital Signs: Temp: 98.2 F (36.8 C) (06/10 0526) Temp Source: Oral (06/10 0526) BP: 151/76 mmHg (06/10 0526) Pulse Rate: 111 (06/10 0526) Intake/Output from previous day: 06/09 0701 - 06/10 0700 In: -  Out: 600 [Emesis/NG output:600] Intake/Output from this shift:    Labs:  Recent Labs  06/02/15 0510 06/03/15 0540 06/04/15 0525  WBC 4.8 7.6 6.8  HGB 10.0* 10.1* 10.3*  HCT 31.2* 31.8* 32.9*  PLT 291 329 384  CREATININE 0.52 0.55 0.54  MG  --  1.7 1.9   Estimated Creatinine Clearance: 70.1 mL/min (by C-G formula based on Cr of 0.54).     Medical History: Past Medical History  Diagnosis Date  . Hypercholesteremia   . Constipation   . Osteoporosis   . Seizure   . Edema   . Mental retardation   . SBO (small bowel obstruction) 11/2013, 05/2015    likely due to adhesions  . Colonic mass 05/2015    right, concerning for cancer    Medications:  Prescriptions prior to admission  Medication Sig Dispense Refill Last Dose  . atorvastatin (LIPITOR) 10 MG tablet Take 10 mg by mouth at bedtime.    05/29/2015 at Unknown time  . bisacodyl (DULCOLAX) 5 MG EC tablet Take 5 mg by mouth daily as needed for mild constipation or moderate constipation.   unknown  . calcium-vitamin D (OSCAL WITH D) 500-200 MG-UNIT per tablet Take 1 tablet by mouth 2 (two) times daily.    05/30/2015 at 0800  . docusate sodium (COLACE) 100 MG capsule Take 100 mg by mouth 2 (two) times daily.   05/30/2015 at 0800  . fenofibrate (TRICOR) 145 MG tablet Take 145 mg by mouth every morning.    05/30/2015 at 0800  . ferrous fumarate (HEMOCYTE - 106 MG FE) 325 (106 FE) MG TABS tablet Take 1 tablet by mouth 2 (two) times daily.   05/30/2015 at 0800  .  furosemide (LASIX) 20 MG tablet Take 20 mg by mouth every morning.    05/30/2015 at 0800  . lactulose, encephalopathy, (CHRONULAC) 10 GM/15ML SOLN Take 10 g by mouth 2 (two) times daily as needed (constipation).   05/30/2015 at 0800  . metroNIDAZOLE (METROGEL) 1 % gel Apply 1 application topically daily. Started 6.1.16   05/30/2015 at 0800  . potassium chloride SA (K-DUR,KLOR-CON) 20 MEQ tablet Take 20 mEq by mouth daily.    05/30/2015 at 0800  . Vitamin D, Ergocalciferol, (DRISDOL) 50000 UNITS CAPS capsule Take 50,000 Units by mouth every 7 (seven) days. Friday   05/28/2015 at Unknown time  . felbamate (FELBATOL) 600 MG tablet Take 1,200 mg by mouth 2 (two) times daily.    Completed Course at 0800    Assessment: 69 y/o F with PMH that includes seizure disorder and mental retardation, on felbamate prior to admission, now with SBO and unable to tolerate oral medications.  Unfortunately there is no IV form of felbamate available.  Discussed with attending MD 6/6 - decision was made to use IV Keppra for now and pharmacy assistance with dosing was requested.   Today, 06/04/2015:  Bowel obstruction slightly improved although clamping NG tube x 24 hrs before attempting bowel prep with Miralax  Per RN, patient looks good with  no evidence of breakthrough seizures or adverse effects from Keppra   Goal of Therapy:  Seizure prevention  Plan:   Continue Keppra at 1000 mg IV q12h.  Follow clinical course.  When bowel obstruction resolves, resume felbamate PO or via tube at PTA dosage (1200 mg BID).  Reuel Boom, PharmD Pager: [phone] 06/04/2015, 11:10 AM

## 2015-06-04 NOTE — Progress Notes (Signed)
Initial Nutrition Assessment  DOCUMENTATION CODES:  Severe malnutrition in context of acute illness/injury, Obesity unspecified  INTERVENTION: - If pt to remain NPO for another 2-3 days, recommend consideration of TPN - Recommend advance to [facility] as soon as medically feasible - RD will continue to monitor for needs  NUTRITION DIAGNOSIS:  Inadequate oral intake related to inability to eat as evidenced by NPO status.  GOAL:  Patient will meet greater than or equal to 90% of their needs  MONITOR:  Other (Comment), Diet advancement, Weight trends, Labs, I & O's  REASON FOR ASSESSMENT:  Consult Assessment of nutrition requirement/status  ASSESSMENT: Per H&P, 69 year old female with hyperlipidemia, osteoporosis, prior history of small bowel obstruction, constipation was brought to ED due to complaints of abdominal pain with multiple episodes of nausea and vomiting. History and ER records, the caregiver at the bedside. Patient is able to provide only limited history due to her mental status, underlying MR. per the caregiver, patient started complaining of generalized abdominal pain since this morning. She had multiple episodes of nausea and vomiting at home and in the ER. She had also complained "loose stools" last night. Patient is unable to describe the abdominal pain, states that it was aching all over.   Pt seen for consult with BMI indicating obesity. Pt has been NPO since admission (5 days). She had NGT placed for decompression 6/8 due to SBO. During visit, NGT not hooked to suction but noted 150cc had been drained of clear-ish brown liquid. Notes per surgery and GI indicate likely need for surgery with SBO not resolving. Should pt need to be NPO for greater than 2 more days, recommend consideration for TPN.  Pt reports PTA she was losing weight. Weight review indicates 4% wt loss in 1 month which is significant for time frame. She denies abdominal pain or nausea at this time.  Not  able to meet needs. Physical assessment showed no muscle or fat wasting but did indicate moderate BLE edema. Medications reviewed. Labs reviewed.  Height:  Ht Readings from Last 1 Encounters:  05/31/15 5\' 4"  (1.626 m)    Weight:  Wt Readings from Last 1 Encounters:  06/04/15 193 lb 9.6 oz (87.816 kg)    Ideal Body Weight:  54.5 kg (kg)  Wt Readings from Last 10 Encounters:  06/04/15 193 lb 9.6 oz (87.816 kg)  05/06/14 201 lb 3.2 oz (91.264 kg)  12/13/13 203 lb 12.8 oz (92.443 kg)  11/29/11 211 lb (95.709 kg)    BMI:  Body mass index is 33.21 kg/(m^2).  Estimated Nutritional Needs:  Kcal:  1600-1800  Protein:  70-85 grams  Fluid:  2-2.2 L/day  Skin:  WDL  Diet Order:  Diet NPO time specified Except for: Ice Chips  EDUCATION NEEDS:  No education needs at this time   Intake/Output Summary (Last 24 hours) at 06/04/15 1408 Last data filed at 06/04/15 0700  Gross per 24 hour  Intake      0 ml  Output    600 ml  Net   -600 ml    Last BM:  6/10    Jarome Matin, RD, LDN Inpatient Clinical Dietitian Pager # [phone] After hours/weekend pager # [phone]

## 2015-06-04 NOTE — Progress Notes (Signed)
Patient discussed in unit rounds today. Patient is not medically stable for return to group home as of yet but can return at dc. Per RN, patient is up ambulating in room.   FL2 on chart for MD [place], MSW, [place]

## 2015-06-04 NOTE — Progress Notes (Signed)
NG tube clamped per MD order at 11 am and then applied to suction X 2 hours with 50cc out. No pt c/o nausea. Will continue to leave clamped X 4 hours then suction X 2 hours for 24 hours as ordered.

## 2015-06-04 NOTE — Progress Notes (Addendum)
Patient ID: [name], female   DOB: 11/29/1946, 69 y.o.   MRN: [id number]    Progress Note   Subjective  Up in chair- denies pain, NG draining    Objective   Vital signs in last 24 hours: Temp:  [97.7 F (36.5 C)-98.6 F (37 C)] 98.2 F (36.8 C) (06/10 0526) Pulse Rate:  [98-111] 111 (06/10 0526) Resp:  [18] 18 (06/10 0526) BP: (151-172)/(63-82) 151/76 mmHg (06/10 0526) SpO2:  [96 %-98 %] 96 % (06/10 0526) Weight:  [193 lb 9.6 oz (87.816 kg)] 193 lb 9.6 oz (87.816 kg) (06/10 0526) Last BM Date: 06/04/15 General:    Older white female in NAD Heart:  Regular rate and rhythm; no murmurs Lungs: Respirations even and unlabored, lungs CTA bilaterally Abdomen:  Soft, mildly distended, BS +. Extremities:  Trace edema. Neurologic:  Alert and oriented,  grossly normal neurologically. Psych:  Cooperative. Normal mood and affect.  Intake/Output from previous day: 06/09 0701 - 06/10 0700 In: -  Out: 600 [Emesis/NG output:600] Intake/Output this shift:    Lab Results:  Recent Labs  06/02/15 0510 06/03/15 0540 06/04/15 0525  WBC 4.8 7.6 6.8  HGB 10.0* 10.1* 10.3*  HCT 31.2* 31.8* 32.9*  PLT 291 329 384   BMET  Recent Labs  06/02/15 0510 06/03/15 0540 06/04/15 0525  NA 138 136 138  K 3.2* 3.0* 3.7  CL 105 103 103  CO2 24 20* 20*  GLUCOSE 91 82 77  BUN 9 7 6   CREATININE 0.52 0.55 0.54  CALCIUM 8.0* 8.2* 8.3*     Studies/Results: Dg Abd 1 View  06/02/2015   CLINICAL DATA:  Small bowel obstruction.  NG tube placement.  EXAM: ABDOMEN - 1 VIEW  COMPARISON:  06/01/2015  FINDINGS: An enteric tube has been placed with tip projecting in the left upper quadrant likely in the gastric fundus and with side hole near the GE junction. There is moderate dilatation of small bowel loops in the central abdomen measuring up to 6 cm in diameter, improved from the prior study. Stool and residual contrast are noted in the colon, which is nondilated. Right upper quadrant surgical clips  are noted.  IMPRESSION: 1. Enteric tube tip likely in the gastric fundus. 2. Moderate small bowel dilatation consistent with obstruction, improved from prior.   Electronically Signed   By: Logan Bores   On: 06/02/2015 12:55   Dg Abd Acute W/chest  06/03/2015   CLINICAL DATA:  Small-bowel obstruction  EXAM: DG ABDOMEN ACUTE W/ 1V CHEST  COMPARISON:  06/02/2015  FINDINGS: Small bowel dilatation shows mild interval improvement. NG tube in the stomach. No free air. Colon decompressed. Surgical clips in the gallbladder fossa.  IMPRESSION: Dilated small bowel loops show mild interval improvement from the prior study consistent with small bowel obstruction.   Electronically Signed   By: Franchot Gallo M.D.   On: 06/03/2015 14:49      Assessment / Plan:   #1 69 yo female with persistent SBO and right colon mass- unclear if obstruction related to intraabdominal malignancy or not. Will start miralax twice daily through NG  Fleets enema again today Tying gradually to prep her bowel Check films today I suspect she will need to proceed to surgery soon even if requires a two step procedure- not seeing much improvement and today is Day #5   Principal Problem:   SBO (small bowel obstruction) Active Problems:   Mental retardation   Convulsions   HLD (hyperlipidemia)   Colonic  mass   Small bowel obstruction   UTI (lower urinary tract infection)    LOS: 5 days   [name]  06/04/2015, 9:05 AM     Attending physician's note   I have taken an interval history, reviewed the chart and examined the patient. I agree with the Advanced Practitioner's note, impression and recommendations. No change in exam. Awaiting todays abd films. Will not be able to adequately prep bowel or perform colonoscopy if SBO does not resolve. Surgical service may need to proceed without colonoscopy. 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   Addendum @ 2:00pm: abdominal films today reviewed: unchanged, persistent SBO  Lee-Anne Flicker T.  Fuller Plan, MD [facility]

## 2015-06-04 NOTE — Progress Notes (Signed)
Occupational Therapy Treatment Patient Details Name: [name] MRN: [id number] DOB: [birth year] Today's Date: 06/04/2015    History of present illness 69 year old female with recurrent small bowel obstruction probably secondary to adhesions. Pt with MR and was I at group home   OT comments  Tolerated well.  Pt had washed up prior to OT's arrival.  Pt states she usually does not use RW: used today--pt is NPO today  Follow Up Recommendations  Supervision - Intermittent    Equipment Recommendations  None recommended by OT    Recommendations for Other Services PT consult (pt usually does not use AD)    Precautions / Restrictions Precautions Precautions: Fall Restrictions Weight Bearing Restrictions: No       Mobility Bed Mobility Overal bed mobility: Modified Independent             General bed mobility comments: HOB raised  Transfers Overall transfer level: Needs assistance Equipment used: Rolling walker (2 wheeled) Transfers: Sit to/from Stand Sit to Stand: Supervision         General transfer comment: cues for UE placement--pt usually does not use RW    Balance                                   ADL       Grooming: Brushing hair;Supervision/safety;Standing               Lower Body Dressing: Minimal assistance;Sit to/from stand   Toilet Transfer: Supervision/safety;BSC;RW             General ADL Comments: Pt stood at sink and combed hair.  She is NPO and has clamped NG tube--did not address teeth today.  Pt was able to don socks but needed min A for L foot as toenail kept catching on nail.  Cues for thoroughness with hair      Vision                     Perception     Praxis      Cognition   Behavior During Therapy: [facility] for tasks assessed/performed Overall Cognitive Status: History of cognitive impairments - at baseline (h/o MR)                       Extremity/Trunk Assessment                Exercises     Shoulder Instructions       General Comments      Pertinent Vitals/ Pain       Pain Assessment: No/denies pain  Home Living                                          Prior Functioning/Environment              Frequency Min 2X/week     Progress Toward Goals  OT Goals(current goals can now be found in the care plan section)  Progress towards OT goals: Progressing toward goals     Plan      Co-evaluation                 End of Session     Activity Tolerance Patient tolerated treatment well   Patient Left in chair;with call bell/phone within  reach   Nurse Communication  (checked with NT for bed alarm--not needed)        Time: 1112-1127 OT Time Calculation (min): 15 min  Charges: OT General Charges $OT Visit: 1 Procedure OT Treatments $Self Care/Home Management : 8-22 mins  Shetara Launer 06/04/2015, 12:16 PM  Lesle Chris, OTR/L [phone] 06/04/2015

## 2015-06-04 NOTE — Progress Notes (Signed)
Patient ID: [name], female   DOB: November 24, 1946, 69 y.o.   MRN: [id number]  [facility] PROGRESS NOTE  [name]:[id number] DOB: [birth year] DOA: 05/30/2015 PCP: Pcp Not In System   Brief narrative:    69 year old female with hyperlipidemia, osteoporosis, prior history of small bowel obstruction, constipation was brought to ED with main concern of several days duration of progressively worsening abd pain, per family member described as constant and throbbing, unknown severity, associated with poor oral intake, nausea and non bloody vomiting.   CT abdomen and pelvis showed recurrent high-grade mid to distal small bowel obstruction with transition point in the periumbilical region, infiltrating mass involving the right colon highly suspicious for colon cancer, at bedtime and prominent mesenteric lymph nodes and/or peritoneal nodules concerning for metastatic disease.   Major events since admission: 6/8 - NG tube placed 6/9 - slight improvement, electrolyte still depleted, needs more potassium 6/10 - no significant improvement in SBP per imaging studies but overall pt is stable   Assessment/Plan:    High-grade SBO (small bowel obstruction): Also new diagnosis of colon mass - Continue NPO, IV fluid hydration, NG tube placed 06/02/2015 - denies any further nausea, vomiting or abdominal pain, CEA elevated at 27.1 - Appreciating GI and surgery following, abdominal x-ray 6/10 with persistent SBO with no significant changes - per GI team, no plan for colonoscopy until SBO resolved  - Continue to supplement electrolytes  Active Problems:  Mental retardation - currently at her baseline mental status   HLD (hyperlipidemia) - Currently NPO    Hypokalemia - Patient receiving KCl with IV fluids, will add additional KCl 3 runs - Mg is 1.9 so will give additional Mg 2 gm IV today  - keep K ~4 and Mg ~2 - repeat BMP and Mg in a.m.     Accelerated HTN - placed on Hydralazine  as needed and blood pressure better controlled   UTI - Urine culture showed only 35,000 colonies, currently on IV Rocephin, remains afebrile  - completed 5 days of Rocephin 06/03/2015    History of seizures - Unfortunately, no IV substitute of Felbamate, discussed with pharmacy and placed on IV Keppra for now     Moderate protein calorie malnutrition in the context of acute illness     Obesity  - Body mass index is 33.21 kg/(m^2).  DVT prophylaxis - Heparin SQ  Code Status: Full.  Family Communication:  plan of care discussed with the patient, no family at bedside  Disposition Plan: Not ready for d/c  IV access:  Peripheral IV  Procedures and diagnostic studies:    Dg Chest 2 View 05/30/2015   Cardiomegaly with vascular congestion and basilar atelectasis.  Marked gastric distention.     Dg Abd 1 View 06/02/2015  Enteric tube tip likely in the gastric fundus. 2. Moderate small bowel dilatation consistent with obstruction, improved   Ct Abdomen Pelvis W Contrast 05/30/2015  Recurrent high-grade mid to distal small bowel obstruction with transition point in the periumbilical region. 2. Strong concern of infiltrating mass involving the right colon, highly suspicious for colon cancer. There are adjacent prominent mesenteric lymph nodes and/or peritoneal nodules concerning for metastatic disease. No distant metastases identified. This colon mass does not appear to be the etiology for the bowel obstruction. 3. Small amount of free pelvic fluid without generalized peritoneal nodularity. 4. Generally stable biliary dilatation status post cholecystectomy.   E  Dg Abd 2 Views 06/01/2015  High-grade partial small bowel obstruction, without  significant change compared to recent CT.     Dg Abd 2 Views 06/04/2015   Continued small bowel obstruction pattern, not significantly changed.    Dg Abd Acute W/chest 06/03/2015  Dilated small bowel loops show mild interval improvement from the prior study  consistent with small bowel obstruction.    Medical Consultants:  GI Surgery   Other Consultants:  PT  IAnti-Infectives:   Rocephin 6/5 --> 6/9  Faye Ramsay, MD  [facility] Pager [phone]  If 7PM-7AM, please contact night-coverage [email or web address] Password [email or web address] 06/04/2015, 10:12 AM   LOS: 5 days   HPI/Subjective: No events overnight.   Objective: Filed Vitals:   06/03/15 0547 06/03/15 1415 06/03/15 2050 06/04/15 0526  BP: 143/66 172/82 154/63 151/76  Pulse: 103 98 106 111  Temp: 98.3 F (36.8 C) 97.7 F (36.5 C) 98.6 F (37 C) 98.2 F (36.8 C)  TempSrc: Oral Oral Oral Oral  Resp: 20 18 18 18   Height:      Weight:    87.816 kg (193 lb 9.6 oz)  SpO2: 95% 96% 98% 96%    Intake/Output Summary (Last 24 hours) at 06/04/15 1012 Last data filed at 06/04/15 0700  Gross per 24 hour  Intake      0 ml  Output    600 ml  Net   -600 ml    Exam:   General:  Pt is alert, follows commands appropriately, not in acute distress  Cardiovascular: Regular rhythm, tachy, S1/S2, no rubs, no gallops  Respiratory: Clear to auscultation bilaterally, no wheezing, diminished breath sounds at bases   Abdomen: Soft, non tender, slightly distended, very faint BS, NGT in place   Data Reviewed: Basic Metabolic Panel:  Recent Labs Lab 05/30/15 1211 05/31/15 0510 06/02/15 0510 06/03/15 0540 06/04/15 0525  NA 141 140 138 136 138  K 4.0 4.0 3.2* 3.0* 3.7  CL 107 104 105 103 103  CO2 24 26 24  20* 20*  GLUCOSE 152* 142* 91 82 77  BUN 23* 25* 9 7 6   CREATININE 0.73 0.65 0.52 0.55 0.54  CALCIUM 9.3 8.8* 8.0* 8.2* 8.3*  MG  --   --   --  1.7 1.9   Liver Function Tests:  Recent Labs Lab 05/30/15 1211  AST 30  ALT 17  ALKPHOS 64  BILITOT <0.1*  PROT 7.6  ALBUMIN 3.4*    Recent Labs Lab 05/30/15 1211  LIPASE 11*   CBC:  Recent Labs Lab 05/30/15 1211 05/31/15 0510 06/02/15 0510 06/03/15 0540 06/04/15 0525  WBC 14.2* 8.6 4.8 7.6 6.8  NEUTROABS 12.3*  --   --    --   --   HGB 11.3* 10.0* 10.0* 10.1* 10.3*  HCT 36.4 32.5* 31.2* 31.8* 32.9*  MCV 87.9 88.8 89.1 86.2 87.5  PLT 476* 386 291 329 384    Recent Results (from the past 240 hour(s))  Urine culture     Status: None   Collection Time: 05/30/15 11:35 AM  Result Value Ref Range Status   Specimen Description URINE, CLEAN CATCH  Final   Special Requests NONE  Final   Colony Count   Final    35,000 COLONIES/ML Performed at [facility]    Culture   Final    Multiple bacterial morphotypes present, none predominant. Suggest appropriate recollection if clinically indicated. Performed at [facility]    Report Status 05/31/2015 FINAL  Final  Urine culture     Status: None   Collection Time: 05/31/15  3:27 AM  Result Value Ref Range Status   Specimen Description URINE, RANDOM  Final   Special Requests NONE  Final   Colony Count NO GROWTH Performed at [facility]   Final   Culture NO GROWTH Performed at [facility]   Final   Report Status 06/01/2015 FINAL  Final  Clostridium Difficile by PCR (not at [facility])     Status: None   Collection Time: 06/01/15  5:05 PM  Result Value Ref Range Status   C difficile by pcr NEGATIVE NEGATIVE Final     Scheduled Meds: . cefTRIAXone (ROCEPHIN)  IV  1 g Intravenous Q24H  . heparin  5,000 Units Subcutaneous 3 times per day  . levETIRAcetam  1,000 mg Intravenous Q12H  . polyethylene glycol  17 g Oral BID  . sodium chloride  3 mL Intravenous Q12H  . sodium phosphate  1 enema Rectal Once   Continuous Infusions: . 0.9 % NaCl with KCl 40 mEq / L 50 mL/hr (06/04/15 0738)

## 2015-06-04 NOTE — Progress Notes (Signed)
Subjective: No real change, she seems fine, no complaints, no pain.  She is accepting of whatever so far.  Her sister and family member is here. I explained we are trying to get her bowel prepped so when she does have surgery it can be a one step procedure.  If SBO does not resolve she would have to be done un prepped and has a much higher chance of ending up with colostomy.    Objective: Vital signs in last 24 hours: Temp:  [97.7 F (36.5 C)-98.6 F (37 C)] 98.2 F (36.8 C) (06/10 0526) Pulse Rate:  [98-111] 111 (06/10 0526) Resp:  [18] 18 (06/10 0526) BP: (151-172)/(63-82) 151/76 mmHg (06/10 0526) SpO2:  [96 %-98 %] 96 % (06/10 0526) Weight:  [87.816 kg (193 lb 9.6 oz)] 87.816 kg (193 lb 9.6 oz) (06/10 0526) Last BM Date: 06/04/15 600 from the NG Afebrile, VSS Labs OK No film yet today, still NPO  Intake/Output from previous day: 06/09 0701 - 06/10 0700 In: -  Out: 600 [Emesis/NG output:600] Intake/Output this shift:    General appearance: alert, cooperative and no distress GI: soft, non-tender; bowel sounds normal; no masses,  no organomegaly  Lab Results:   Recent Labs  06/03/15 0540 06/04/15 0525  WBC 7.6 6.8  HGB 10.1* 10.3*  HCT 31.8* 32.9*  PLT 329 384    BMET  Recent Labs  06/03/15 0540 06/04/15 0525  NA 136 138  K 3.0* 3.7  CL 103 103  CO2 20* 20*  GLUCOSE 82 77  BUN 7 6  CREATININE 0.55 0.54  CALCIUM 8.2* 8.3*   PT/INR No results for input(s): LABPROT, INR in the last 72 hours.   Recent Labs Lab 05/30/15 1211  AST 30  ALT 17  ALKPHOS 64  BILITOT <0.1*  PROT 7.6  ALBUMIN 3.4*     Lipase     Component Value Date/Time   LIPASE 11* 05/30/2015 1211     Studies/Results: Dg Abd 1 View  06/02/2015   CLINICAL DATA:  Small bowel obstruction.  NG tube placement.  EXAM: ABDOMEN - 1 VIEW  COMPARISON:  06/01/2015  FINDINGS: An enteric tube has been placed with tip projecting in the left upper quadrant likely in the gastric fundus and  with side hole near the GE junction. There is moderate dilatation of small bowel loops in the central abdomen measuring up to 6 cm in diameter, improved from the prior study. Stool and residual contrast are noted in the colon, which is nondilated. Right upper quadrant surgical clips are noted.  IMPRESSION: 1. Enteric tube tip likely in the gastric fundus. 2. Moderate small bowel dilatation consistent with obstruction, improved from prior.   Electronically Signed   By: Logan Bores   On: 06/02/2015 12:55   Dg Abd Acute W/chest  06/03/2015   CLINICAL DATA:  Small-bowel obstruction  EXAM: DG ABDOMEN ACUTE W/ 1V CHEST  COMPARISON:  06/02/2015  FINDINGS: Small bowel dilatation shows mild interval improvement. NG tube in the stomach. No free air. Colon decompressed. Surgical clips in the gallbladder fossa.  IMPRESSION: Dilated small bowel loops show mild interval improvement from the prior study consistent with small bowel obstruction.   Electronically Signed   By: Franchot Gallo M.D.   On: 06/03/2015 14:49    Medications: . cefTRIAXone (ROCEPHIN)  IV  1 g Intravenous Q24H  . heparin  5,000 Units Subcutaneous 3 times per day  . levETIRAcetam  1,000 mg Intravenous Q12H  . polyethylene glycol  17 g Oral BID  . sodium chloride  3 mL Intravenous Q12H  . sodium phosphate  1 enema Rectal Once    Assessment/Plan Right colon mass SBO  Mental retardation,  Hx of seizure UTI Dyslipidemia Antibiotics: Day 3 Rocephin DVT: Heparin/SCD    NG still draining mostly clear fluid, No pain or discomfort.  I would try clamping NG and see how she does.   LOS: 5 days    [name] 06/04/2015

## 2015-06-05 ENCOUNTER — Inpatient Hospital Stay (HOSPITAL_COMMUNITY): Payer: Medicare Other

## 2015-06-05 DIAGNOSIS — E43 Unspecified severe protein-calorie malnutrition: Secondary | ICD-10-CM | POA: Diagnosis present

## 2015-06-05 LAB — BASIC METABOLIC PANEL
Anion gap: 14 (ref 5–15)
BUN: <5 mg/dL — ABNORMAL LOW (ref 6–20)
CALCIUM: 8.5 mg/dL — AB (ref 8.9–10.3)
CO2: 21 mmol/L — ABNORMAL LOW (ref 22–32)
CREATININE: 0.43 mg/dL — AB (ref 0.44–1.00)
Chloride: 102 mmol/L (ref 101–111)
Glucose, Bld: 78 mg/dL (ref 65–99)
Potassium: 4 mmol/L (ref 3.5–5.1)
SODIUM: 137 mmol/L (ref 135–145)

## 2015-06-05 LAB — MAGNESIUM: MAGNESIUM: 1.8 mg/dL (ref 1.7–2.4)

## 2015-06-05 MED ORDER — POLYETHYLENE GLYCOL 3350 17 G PO PACK
17.0000 g | PACK | Freq: Three times a day (TID) | ORAL | Status: DC
  Administered 2015-06-05 (×2): 17 g via ORAL
  Filled 2015-06-05 (×4): qty 1

## 2015-06-05 MED ORDER — MAGNESIUM SULFATE 2 GM/50ML IV SOLN
2.0000 g | Freq: Once | INTRAVENOUS | Status: AC
  Administered 2015-06-05: 2 g via INTRAVENOUS
  Filled 2015-06-05: qty 50

## 2015-06-05 NOTE — Progress Notes (Signed)
Patient ID: [name], female   DOB: 14-Aug-1946, 68 y.o.   MRN: [id number]    Subjective: Pt states she is having flatus.  She had 5 stools recorded overnight.    Objective: Vital signs in last 24 hours: Temp:  [97.4 F (36.3 C)-97.7 F (36.5 C)] 97.4 F (36.3 C) 07-04-2023 0624) Pulse Rate:  [93-97] 97 Jul 04, 2023 0624) Resp:  [18] 18 07/04/2023 0624) BP: (125-149)/(74) 125/74 mmHg Jul 04, 2023 0624) SpO2:  [97 %-98 %] 97 % 04-Jul-2023 0624) Weight:  [86.818 kg (191 lb 6.4 oz)] 86.818 kg (191 lb 6.4 oz) 07-04-2023 0600) Last BM Date: 06/04/15  Afebrile, VSS Labs OK Xray with some improvement  Intake/Output from previous day: 06/10 0701 - 04-Jul-2023 0700 In: 603 [I.V.:603] Out: 75 [Emesis/NG output:75] Intake/Output this shift: Total I/O In: -  Out: 275 [Urine:250; Emesis/NG output:25]  General appearance: alert, cooperative and no distress GI: soft, non-tender; bowel sounds normal; no masses,  no organomegaly  Lab Results:   Recent Labs  06/04/15 0525 06/04/15 1236  WBC 6.8 7.5  HGB 10.3* 10.4*  HCT 32.9* 32.6*  PLT 384 381    BMET  Recent Labs  06/04/15 0525 2015-07-04 0518  NA 138 137  K 3.7 4.0  CL 103 102  CO2 20* 21*  GLUCOSE 77 78  BUN 6 <5*  CREATININE 0.54 0.43*  CALCIUM 8.3* 8.5*   PT/INR No results for input(s): LABPROT, INR in the last 72 hours.   Recent Labs Lab 05/30/15 1211  AST 30  ALT 17  ALKPHOS 64  BILITOT <0.1*  PROT 7.6  ALBUMIN 3.4*     Lipase     Component Value Date/Time   LIPASE 11* 05/30/2015 1211     Studies/Results: Dg Abd 2 Views  07-04-2015   CLINICAL DATA:  Small bowel obstruction.  EXAM: ABDOMEN - 2 VIEW  COMPARISON:  06/04/2015  FINDINGS: Enteric tube remains in place, with tip now directed laterally in the region of the gastric fundus and side hole near the level the GE junction. Right upper quadrant abdominal surgical clips are noted. There is no evidence of intraperitoneal free air. Mildly to moderately dilated small bowel loops  are again seen in the mid abdomen, predominantly left-sided with overall small bowel distension slightly improved from the prior study. A few small bowel air-fluid levels are again seen. Gas is again noted in nondilated colon. No acute osseous abnormality.  IMPRESSION: Dilated small bowel loops consistent with obstruction, slightly improved from prior.   Electronically Signed   By: Logan Bores   On: 07-04-2015 08:24   Dg Abd 2 Views  06/04/2015   CLINICAL DATA:  Small bowel obstruction. Abdominal pain, distention.  EXAM: ABDOMEN - 2 VIEW  COMPARISON:  06/03/2015  FINDINGS: NG tube is in the stomach. Mildly dilated mid abdominal small bowel loops are again noted, similar to prior study. Gas within nondistended colon. Findings compatible with partial small bowel obstruction, unchanged. Prior cholecystectomy. No organomegaly or acute bony abnormality.  IMPRESSION: Continued small bowel obstruction pattern, not significantly changed.   Electronically Signed   By: Rolm Baptise M.D.   On: 06/04/2015 11:18   Dg Abd Acute W/chest  06/03/2015   CLINICAL DATA:  Small-bowel obstruction  EXAM: DG ABDOMEN ACUTE W/ 1V CHEST  COMPARISON:  06/02/2015  FINDINGS: Small bowel dilatation shows mild interval improvement. NG tube in the stomach. No free air. Colon decompressed. Surgical clips in the gallbladder fossa.  IMPRESSION: Dilated small bowel loops show mild interval  improvement from the prior study consistent with small bowel obstruction.   Electronically Signed   By: Franchot Gallo M.D.   On: 06/03/2015 14:49    Medications: . heparin  5,000 Units Subcutaneous 3 times per day  . levETIRAcetam  1,000 mg Intravenous Q12H  . magnesium sulfate 1 - 4 g bolus IVPB  2 g Intravenous Once  . polyethylene glycol  17 g Oral TID  . sodium chloride  3 mL Intravenous Q12H    Assessment/Plan Right colon mass SBO  Mental retardation Hx of seizure UTI Dyslipidemia Antibiotics: Day 3 Rocephin DVT: Heparin/SCD   SBO  appears to be resolving.  Will plan on gentle prep and colonoscopy when GI feels appropriate.    LOS: 6 days    [facility] 06/05/2015

## 2015-06-05 NOTE — Progress Notes (Signed)
Patient ID: [name], female   DOB: 1946-09-11, 69 y.o.   MRN: [id number]  [facility] PROGRESS NOTE  [name] NWG:[id number] DOB: 1946/06/02 DOA: 05/30/2015 PCP: Pcp Not In System   Brief narrative:    69 year old female with hyperlipidemia, osteoporosis, prior history of small bowel obstruction, constipation was brought to ED with main concern of several days duration of progressively worsening abd pain, per family member described as constant and throbbing, unknown severity, associated with poor oral intake, nausea and non bloody vomiting.   CT abdomen and pelvis showed recurrent high-grade mid to distal small bowel obstruction with transition point in the periumbilical region, infiltrating mass involving the right colon highly suspicious for colon cancer, at bedtime and prominent mesenteric lymph nodes and/or peritoneal nodules concerning for metastatic disease.   Major events since admission: 6/8 - NG tube placed 6/9 - slight improvement, electrolyte still depleted, needs more potassium 6/10 - no significant improvement in SBP per imaging studies but overall pt is stable   Assessment/Plan:    High-grade SBO (small bowel obstruction): Also new diagnosis of colon mass - Continue NPO, IV fluid hydration, NG tube placed 06/02/2015 - denies any further nausea, vomiting or abdominal pain, CEA elevated at 27.1 - Appreciating GI and surgery following, abdominal x-ray 6/10 with persistent SBO with no significant changes - KUB pending this AM  - per GI team, no plan for colonoscopy until SBO resolved  - Continue to supplement electrolytes  Active Problems:  Mental retardation - currently at her baseline mental status   HLD (hyperlipidemia) - Currently NPO    Hypokalemia - Patient receiving KCl with IV fluids - K is now WNL, Mg 1.8, give additional 2 gm IV Mg x 1  - keep K ~4 and Mg ~2 - repeat BMP and Mg in a.m.     Accelerated HTN - placed on Hydralazine as  needed and blood pressure better controlled   UTI - Urine culture showed only 35,000 colonies, currently on IV Rocephin, remains afebrile  - completed 5 days of Rocephin 06/03/2015    History of seizures - Unfortunately, no IV substitute of Felbamate, discussed with pharmacy and placed on IV Keppra for now     Severe protein calorie malnutrition in the context of acute illness  - appreciate nutritionist input    Obesity  - Body mass index is 32.84 kg/(m^2).  DVT prophylaxis - Heparin SQ  Code Status: Full.  Family Communication:  plan of care discussed with the patient, no family at bedside  Disposition Plan: Not ready for d/c  IV access:  Peripheral IV  Procedures and diagnostic studies:    Dg Chest 2 View 05/30/2015   Cardiomegaly with vascular congestion and basilar atelectasis.  Marked gastric distention.     Dg Abd 1 View 06/02/2015  Enteric tube tip likely in the gastric fundus. 2. Moderate small bowel dilatation consistent with obstruction, improved   Ct Abdomen Pelvis W Contrast 05/30/2015  Recurrent high-grade mid to distal small bowel obstruction with transition point in the periumbilical region. 2. Strong concern of infiltrating mass involving the right colon, highly suspicious for colon cancer. There are adjacent prominent mesenteric lymph nodes and/or peritoneal nodules concerning for metastatic disease. No distant metastases identified. This colon mass does not appear to be the etiology for the bowel obstruction. 3. Small amount of free pelvic fluid without generalized peritoneal nodularity. 4. Generally stable biliary dilatation status post cholecystectomy.   E  Dg Abd 2 Views 06/01/2015  High-grade partial small bowel obstruction, without significant change compared to recent CT.     Dg Abd 2 Views 06/04/2015   Continued small bowel obstruction pattern, not significantly changed.    Dg Abd Acute W/chest 06/03/2015  Dilated small bowel loops show mild interval improvement  from the prior study consistent with small bowel obstruction.    Medical Consultants:  GI Surgery   Other Consultants:  PT  IAnti-Infectives:   Rocephin 6/5 --> 6/9  Faye Ramsay, MD  [facility] Pager [phone]  If 7PM-7AM, please contact night-coverage [email or web address] Password [email or web address] 06/05/2015, 7:04 AM   LOS: 6 days   HPI/Subjective: No events overnight.   Objective: Filed Vitals:   06/04/15 0526 06/04/15 2100 06/05/15 0600 06/05/15 0624  BP: 151/76 149/74  125/74  Pulse: 111 93  97  Temp: 98.2 F (36.8 C) 97.7 F (36.5 C)  97.4 F (36.3 C)  TempSrc: Oral Oral  Axillary  Resp: 18 18  18   Height:      Weight: 87.816 kg (193 lb 9.6 oz)  86.818 kg (191 lb 6.4 oz)   SpO2: 96% 98%  97%    Intake/Output Summary (Last 24 hours) at 06/05/15 0704 Last data filed at 06/05/15 0130  Gross per 24 hour  Intake    603 ml  Output     75 ml  Net    528 ml    Exam:   General:  Pt is alert, follows commands appropriately, not in acute distress  Cardiovascular: Regular rhythm, tachy, S1/S2, no rubs, no gallops  Respiratory: Clear to auscultation bilaterally, no wheezing, diminished breath sounds at bases   Abdomen: Soft, non tender, slightly distended, very faint BS, NGT in place   Data Reviewed: Basic Metabolic Panel:  Recent Labs Lab 05/31/15 0510 06/02/15 0510 06/03/15 0540 06/04/15 0525 06/05/15 0518  NA 140 138 136 138 137  K 4.0 3.2* 3.0* 3.7 4.0  CL 104 105 103 103 102  CO2 26 24 20* 20* 21*  GLUCOSE 142* 91 82 77 78  BUN 25* 9 7 6  <5*  CREATININE 0.65 0.52 0.55 0.54 0.43*  CALCIUM 8.8* 8.0* 8.2* 8.3* 8.5*  MG  --   --  1.7 1.9 1.8   Liver Function Tests:  Recent Labs Lab 05/30/15 1211  AST 30  ALT 17  ALKPHOS 64  BILITOT <0.1*  PROT 7.6  ALBUMIN 3.4*    Recent Labs Lab 05/30/15 1211  LIPASE 11*   CBC:  Recent Labs Lab 05/30/15 1211 05/31/15 0510 06/02/15 0510 06/03/15 0540 06/04/15 0525 06/04/15 1236  WBC 14.2* 8.6 4.8 7.6  6.8 7.5  NEUTROABS 12.3*  --   --   --   --   --   HGB 11.3* 10.0* 10.0* 10.1* 10.3* 10.4*  HCT 36.4 32.5* 31.2* 31.8* 32.9* 32.6*  MCV 87.9 88.8 89.1 86.2 87.5 87.4  PLT 476* 386 291 329 384 381    Recent Results (from the past 240 hour(s))  Urine culture     Status: None   Collection Time: 05/30/15 11:35 AM  Result Value Ref Range Status   Specimen Description URINE, CLEAN CATCH  Final   Special Requests NONE  Final   Colony Count   Final    35,000 COLONIES/ML Performed at [facility]    Culture   Final    Multiple bacterial morphotypes present, none predominant. Suggest appropriate recollection if clinically indicated. Performed at [facility]    Report Status 05/31/2015 FINAL  Final  Urine culture     Status: None   Collection Time: 05/31/15  3:27 AM  Result Value Ref Range Status   Specimen Description URINE, RANDOM  Final   Special Requests NONE  Final   Colony Count NO GROWTH Performed at [facility]   Final   Culture NO GROWTH Performed at [facility]   Final   Report Status 06/01/2015 FINAL  Final  Clostridium Difficile by PCR (not at [facility])     Status: None   Collection Time: 06/01/15  5:05 PM  Result Value Ref Range Status   C difficile by pcr NEGATIVE NEGATIVE Final     Scheduled Meds: . heparin  5,000 Units Subcutaneous 3 times per day  . levETIRAcetam  1,000 mg Intravenous Q12H  . polyethylene glycol  17 g Oral BID  . sodium chloride  3 mL Intravenous Q12H   Continuous Infusions: . 0.9 % NaCl with KCl 40 mEq / L 50 mL/hr (06/04/15 0738)

## 2015-06-05 NOTE — Progress Notes (Signed)
Patient ID: [name], female   DOB: 1946-02-25, 69 y.o.   MRN: [id number]    Progress Note   Subjective  Up in chair, denies pain... 2 loose BM's during night after starting Miralax thru NG yesterday. Tube is clamped. KUB this am- persistent SBO ?slight improvement   Objective   Vital signs in last 24 hours: Temp:  [97.4 F (36.3 C)-97.7 F (36.5 C)] 97.4 F (36.3 C) 06-10-2023 0624) Pulse Rate:  [93-97] 97 06/10/23 0624) Resp:  [18] 18 06/10/23 0624) BP: (125-149)/(74) 125/74 mmHg 06-10-2023 0624) SpO2:  [97 %-98 %] 97 % 2023/06/10 0624) Weight:  [191 lb 6.4 oz (86.818 kg)] 191 lb 6.4 oz (86.818 kg) 06-10-2023 0600) Last BM Date: 06/04/15 General: older    white female in NAD Heart:  Regular rate and rhythm; no murmurs Lungs: Respirations even and unlabored, lungs CTA bilaterally Abdomen:  Soft, nontender and nondistended. BS decreased. Extremities:  Without edema. Neurologic:  Alert and oriented,  grossly normal neurologically. Psych:  Cooperative. Normal mood and affect.  Intake/Output from previous day: 06/10 0701 - 2023/06/10 0700 In: 603 [I.V.:603] Out: 75 [Emesis/NG output:75] Intake/Output this shift: Total I/O In: -  Out: 275 [Urine:250; Emesis/NG output:25]  Lab Results:  Recent Labs  06/03/15 0540 06/04/15 0525 06/04/15 1236  WBC 7.6 6.8 7.5  HGB 10.1* 10.3* 10.4*  HCT 31.8* 32.9* 32.6*  PLT 329 384 381   BMET  Recent Labs  06/03/15 0540 06/04/15 0525 2015/06/10 0518  NA 136 138 137  K 3.0* 3.7 4.0  CL 103 103 102  CO2 20* 20* 21*  GLUCOSE 82 77 78  BUN 7 6 <5*  CREATININE 0.55 0.54 0.43*  CALCIUM 8.2* 8.3* 8.5*   LFT No results for input(s): PROT, ALBUMIN, AST, ALT, ALKPHOS, BILITOT, BILIDIR, IBILI in the last 72 hours. PT/INR No results for input(s): LABPROT, INR in the last 72 hours.  Studies/Results: Dg Abd 2 Views  06/10/15   CLINICAL DATA:  Small bowel obstruction.  EXAM: ABDOMEN - 2 VIEW  COMPARISON:  06/04/2015  FINDINGS: Enteric tube remains in  place, with tip now directed laterally in the region of the gastric fundus and side hole near the level the GE junction. Right upper quadrant abdominal surgical clips are noted. There is no evidence of intraperitoneal free air. Mildly to moderately dilated small bowel loops are again seen in the mid abdomen, predominantly left-sided with overall small bowel distension slightly improved from the prior study. A few small bowel air-fluid levels are again seen. Gas is again noted in nondilated colon. No acute osseous abnormality.  IMPRESSION: Dilated small bowel loops consistent with obstruction, slightly improved from prior.   Electronically Signed   By: Logan Bores   On: 10-Jun-2015 08:24   Dg Abd 2 Views  06/04/2015   CLINICAL DATA:  Small bowel obstruction. Abdominal pain, distention.  EXAM: ABDOMEN - 2 VIEW  COMPARISON:  06/03/2015  FINDINGS: NG tube is in the stomach. Mildly dilated mid abdominal small bowel loops are again noted, similar to prior study. Gas within nondistended colon. Findings compatible with partial small bowel obstruction, unchanged. Prior cholecystectomy. No organomegaly or acute bony abnormality.  IMPRESSION: Continued small bowel obstruction pattern, not significantly changed.   Electronically Signed   By: Rolm Baptise M.D.   On: 06/04/2015 11:18   Dg Abd Acute W/chest  06/03/2015   CLINICAL DATA:  Small-bowel obstruction  EXAM: DG ABDOMEN ACUTE W/ 1V CHEST  COMPARISON:  06/02/2015  FINDINGS: Small bowel  dilatation shows mild interval improvement. NG tube in the stomach. No free air. Colon decompressed. Surgical clips in the gallbladder fossa.  IMPRESSION: Dilated small bowel loops show mild interval improvement from the prior study consistent with small bowel obstruction.   Electronically Signed   By: Franchot Gallo M.D.   On: 06/03/2015 14:49       Assessment / Plan:    #1 69 yo female with SBO, and cecal mass-not clear is SBO is a separate process or perhaps related to spread  of intra abdominal malignancy She needs surgery - surgery trying to wait for improvement so bowel can be prepped- from GI standpoint, not planning on colonoscopy because she needs surgery regardless Will continue Miralax via NG today-increase to 3 doses daily- then defer to surgery to schedule surgery #2 Nutrition- today is day #6 hospital stay - need to start TNA, place midline or PICC  Principal Problem:   SBO (small bowel obstruction) Active Problems:   Mental retardation   Convulsions   HLD (hyperlipidemia)   Colonic mass   Small bowel obstruction   UTI (lower urinary tract infection)   Protein-calorie malnutrition, severe    LOS: 6 days   [name]  06/05/2015, 8:36 AM     Attending physician's note   I have taken an interval history, reviewed the chart and examined the patient. I agree with the Advanced Practitioner's note, impression and recommendations. Some flatus and bowel movements overnight. Abd films slightly improved but SBO persists. Will reconsider colonoscopy if SBO resolves but it is day #6 of hospitalization without resolution of SBO. She will need surgery at some point for the right colon mass and possibly for the SBO too if it does not resolve.   [name]. Fuller Plan, MD [facility]

## 2015-06-06 LAB — CBC
HCT: 31.5 % — ABNORMAL LOW (ref 36.0–46.0)
Hemoglobin: 10.2 g/dL — ABNORMAL LOW (ref 12.0–15.0)
MCH: 28.1 pg (ref 26.0–34.0)
MCHC: 32.4 g/dL (ref 30.0–36.0)
MCV: 86.8 fL (ref 78.0–100.0)
PLATELETS: 348 10*3/uL (ref 150–400)
RBC: 3.63 MIL/uL — ABNORMAL LOW (ref 3.87–5.11)
RDW: 13.4 % (ref 11.5–15.5)
WBC: 7.2 10*3/uL (ref 4.0–10.5)

## 2015-06-06 LAB — BASIC METABOLIC PANEL
ANION GAP: 13 (ref 5–15)
CALCIUM: 8.8 mg/dL — AB (ref 8.9–10.3)
CO2: 24 mmol/L (ref 22–32)
CREATININE: 0.54 mg/dL (ref 0.44–1.00)
Chloride: 103 mmol/L (ref 101–111)
GFR calc Af Amer: >60 mL/min (ref >60)
GFR calc non Af Amer: >60 mL/min (ref >60)
GLUCOSE: 102 mg/dL — AB (ref 65–99)
Potassium: 4.3 mmol/L (ref 3.5–5.1)
SODIUM: 140 mmol/L (ref 135–145)

## 2015-06-06 LAB — MAGNESIUM: Magnesium: 1.8 mg/dL (ref 1.7–2.4)

## 2015-06-06 MED ORDER — PEG-KCL-NACL-NASULF-NA ASC-C 100 G PO SOLR
0.5000 | Freq: Once | ORAL | Status: AC
  Administered 2015-06-06: 100 g via ORAL
  Filled 2015-06-06: qty 1

## 2015-06-06 MED ORDER — PEG 3350-KCL-NA BICARB-NACL 420 G PO SOLR: 4000.0000 mL | Freq: Once | ORAL | Status: DC

## 2015-06-06 MED ORDER — PEG-KCL-NACL-NASULF-NA ASC-C 100 G PO SOLR
0.5000 | Freq: Once | ORAL | Status: AC
  Administered 2015-06-06: 100 g via ORAL

## 2015-06-06 MED ORDER — PEG-KCL-NACL-NASULF-NA ASC-C 100 G PO SOLR: 1.0000 | Freq: Once | ORAL | Status: DC

## 2015-06-06 MED ORDER — MAGNESIUM SULFATE 2 GM/50ML IV SOLN
2.0000 g | Freq: Once | INTRAVENOUS | Status: AC
  Administered 2015-06-06: 2 g via INTRAVENOUS
  Filled 2015-06-06: qty 50

## 2015-06-06 NOTE — Progress Notes (Signed)
Patient ID: [name], female   DOB: May 04, 1946, 69 y.o.   MRN: [id number]    Subjective: Pt continues to have flatus.  Had multiple bowel movements overnight.  No n/v even with NGT clamped.    Objective: Vital signs in last 24 hours: Temp:  [97.7 F (36.5 C)-99.1 F (37.3 C)] 98.3 F (36.8 C) (06/12 0610) Pulse Rate:  [96-113] 96 (06/12 0610) Resp:  [18] 18 (06/12 0610) BP: (132-143)/(69-86) 132/86 mmHg (06/12 0610) SpO2:  [97 %-98 %] 98 % (06/12 0610) Last BM Date: 06-11-2015  Afebrile, VSS Labs OK Xray with some improvement  Intake/Output from previous day: June 11, 2023 0701 - 06/12 0700 In: 1021.7 [I.V.:1021.7] Out: 875 [Urine:750; Emesis/NG output:125] Intake/Output this shift:    General appearance: alert, cooperative and no distress GI: soft, non-tender; bowel sounds normal; non distended.  Lab Results:   Recent Labs  06/04/15 1236 06/06/15 0510  WBC 7.5 7.2  HGB 10.4* 10.2*  HCT 32.6* 31.5*  PLT 381 348    BMET  Recent Labs  Jun 11, 2015 0518 06/06/15 0510  NA 137 140  K 4.0 4.3  CL 102 103  CO2 21* 24  GLUCOSE 78 102*  BUN <5* <5*  CREATININE 0.43* 0.54  CALCIUM 8.5* 8.8*   PT/INR No results for input(s): LABPROT, INR in the last 72 hours.   Recent Labs Lab 05/30/15 1211  AST 30  ALT 17  ALKPHOS 64  BILITOT <0.1*  PROT 7.6  ALBUMIN 3.4*     Lipase     Component Value Date/Time   LIPASE 11* 05/30/2015 1211     Studies/Results: Dg Abd 2 Views  June 11, 2015   CLINICAL DATA:  Small bowel obstruction.  EXAM: ABDOMEN - 2 VIEW  COMPARISON:  06/04/2015  FINDINGS: Enteric tube remains in place, with tip now directed laterally in the region of the gastric fundus and side hole near the level the GE junction. Right upper quadrant abdominal surgical clips are noted. There is no evidence of intraperitoneal free air. Mildly to moderately dilated small bowel loops are again seen in the mid abdomen, predominantly left-sided with overall small bowel  distension slightly improved from the prior study. A few small bowel air-fluid levels are again seen. Gas is again noted in nondilated colon. No acute osseous abnormality.  IMPRESSION: Dilated small bowel loops consistent with obstruction, slightly improved from prior.   Electronically Signed   By: Logan Bores   On: 11-Jun-2015 08:24   Dg Abd 2 Views  06/04/2015   CLINICAL DATA:  Small bowel obstruction. Abdominal pain, distention.  EXAM: ABDOMEN - 2 VIEW  COMPARISON:  06/03/2015  FINDINGS: NG tube is in the stomach. Mildly dilated mid abdominal small bowel loops are again noted, similar to prior study. Gas within nondistended colon. Findings compatible with partial small bowel obstruction, unchanged. Prior cholecystectomy. No organomegaly or acute bony abnormality.  IMPRESSION: Continued small bowel obstruction pattern, not significantly changed.   Electronically Signed   By: Rolm Baptise M.D.   On: 06/04/2015 11:18    Medications: . heparin  5,000 Units Subcutaneous 3 times per day  . levETIRAcetam  1,000 mg Intravenous Q12H  . polyethylene glycol  17 g Oral TID  . sodium chloride  3 mL Intravenous Q12H    Assessment/Plan Right colon mass SBO  Mental retardation Hx of seizure UTI Dyslipidemia Antibiotics: Day 3 Rocephin DVT: Heparin/SCD   SBO resolving.  Would start gentle prep.  Xrays seem to lag behind clinical picture.  Saw GI  note.  Agree that colonoscopy with active bowel obstruction is difficult, but obstruction appears to be resolved.  Will challenge with golytely.      LOS: 7 days    [facility] 06/06/2015

## 2015-06-06 NOTE — Progress Notes (Signed)
Patient ID: [name], female   DOB: 19-Jan-1946, 69 y.o.   MRN: [id number]  [facility] PROGRESS NOTE  [name] UXL:[id number] DOB: June 29, 1946 DOA: 05/30/2015 PCP: Pcp Not In System   Brief narrative:    69 year old female with hyperlipidemia, osteoporosis, prior history of small bowel obstruction, constipation was brought to ED with main concern of several days duration of progressively worsening abd pain, per family member described as constant and throbbing, unknown severity, associated with poor oral intake, nausea and non bloody vomiting.   CT abdomen and pelvis showed recurrent high-grade mid to distal small bowel obstruction with transition point in the periumbilical region, infiltrating mass involving the right colon highly suspicious for colon cancer, at bedtime and prominent mesenteric lymph nodes and/or peritoneal nodules concerning for metastatic disease.   Major events since admission: 6/8 - NG tube placed 6/9 - slight improvement, electrolyte still depleted, needs more potassium 6/10 - no significant improvement in SBP per imaging studies but overall pt is stable   Assessment/Plan:    High-grade SBO (small bowel obstruction): Also new diagnosis of colon mass - Continue NPO, IV fluid hydration, NG tube placed 06/02/2015 - denies any further nausea, vomiting or abdominal pain, CEA elevated at 27.1 - Appreciating GI and surgery following, abdominal x-ray 6/10 and 6/11 with persistent SBO but appears to be resolving  - further plan per GI and surgery team  - Continue to supplement electrolytes  Active Problems:  Mental retardation - currently at her baseline mental status   HLD (hyperlipidemia) - Currently NPO    Hypokalemia - Patient receiving KCl with IV fluids - K is now WNL, Mg still 1.8, give additional 2 gm IV Mg x 1  - keep K ~4 and Mg ~2 - repeat BMP and Mg in a.m.     Accelerated HTN - placed on Hydralazine as needed and blood pressure  better controlled   UTI - Urine culture showed only 35,000 colonies, currently on IV Rocephin, remains afebrile  - completed 5 days of Rocephin 06/03/2015    History of seizures - Unfortunately, no IV substitute of Felbamate, discussed with pharmacy and placed on IV Keppra for now     Severe protein calorie malnutrition in the context of acute illness  - appreciate nutritionist input    Obesity  - Body mass index is 32.84 kg/(m^2).  DVT prophylaxis - Heparin SQ  Code Status: Full.  Family Communication:  plan of care discussed with the patient, no family at bedside  Disposition Plan: Not ready for d/c  IV access:  Peripheral IV  Procedures and diagnostic studies:    Dg Chest 2 View 05/30/2015   Cardiomegaly with vascular congestion and basilar atelectasis.  Marked gastric distention.     Dg Abd 1 View 06/02/2015  Enteric tube tip likely in the gastric fundus. 2. Moderate small bowel dilatation consistent with obstruction, improved   Ct Abdomen Pelvis W Contrast 05/30/2015  Recurrent high-grade mid to distal small bowel obstruction with transition point in the periumbilical region. 2. Strong concern of infiltrating mass involving the right colon, highly suspicious for colon cancer. There are adjacent prominent mesenteric lymph nodes and/or peritoneal nodules concerning for metastatic disease. No distant metastases identified. This colon mass does not appear to be the etiology for the bowel obstruction. 3. Small amount of free pelvic fluid without generalized peritoneal nodularity. 4. Generally stable biliary dilatation status post cholecystectomy.   E  Dg Abd 2 Views 06/01/2015  High-grade partial small bowel  obstruction, without significant change compared to recent CT.     Dg Abd 2 Views 06/04/2015   Continued small bowel obstruction pattern, not significantly changed.    Dg Abd Acute W/chest 06/03/2015  Dilated small bowel loops show mild interval improvement from the prior study  consistent with small bowel obstruction.    Medical Consultants:  GI Surgery   Other Consultants:  PT  IAnti-Infectives:   Rocephin 6/5 --> 6/9  Faye Ramsay, MD  [facility] Pager [phone]  If 7PM-7AM, please contact night-coverage [email or web address] Password [facility] 06/06/2015, 10:33 AM   LOS: 7 days   HPI/Subjective: No events overnight.   Objective: Filed Vitals:   06/05/15 0624 06/05/15 1419 06/05/15 2136 06/06/15 0610  BP: 125/74 143/69 141/82 132/86  Pulse: 97 113 110 96  Temp: 97.4 F (36.3 C) 97.7 F (36.5 C) 99.1 F (37.3 C) 98.3 F (36.8 C)  TempSrc: Axillary Oral Oral Oral  Resp: _0 Height:      Weight:      SpO2: 97% 97% 97% 98%    Intake/Output Summary (Last 24 hours) at 06/06/15 1033 Last data filed at 06/05/15 1610  Gross per 24 hour  Intake 1021.67 ml  Output    600 ml  Net 421.67 ml    Exam:   General:  Pt is alert, follows commands appropriately, not in acute distress  Cardiovascular: Regular rhythm, tachy, S1/S2, no rubs, no gallops  Respiratory: Clear to auscultation bilaterally, no wheezing, diminished breath sounds at bases   Abdomen: Soft, non tender, slightly distended, very faint BS, NGT in place   Data Reviewed: Basic Metabolic Panel:  Recent Labs Lab 06/02/15 0510 06/03/15 0540 06/04/15 0525 06/05/15 0518 06/06/15 0510  NA 138 136 138 137 140  K 3.2* 3.0* 3.7 4.0 4.3  CL 105 103 103 102 103  CO2 24 20* 20* 21* 24  GLUCOSE 91 82 77 78 102*  BUN _1 <5* <5*  CREATININE 0.52 0.55 0.54 0.43* 0.54  CALCIUM 8.0* 8.2* 8.3* 8.5* 8.8*  MG  --  1.7 1.9 1.8 1.8   Liver Function Tests:  Recent Labs Lab 05/30/15 1211  AST 30  ALT 17  ALKPHOS 64  BILITOT <0.1*  PROT 7.6  ALBUMIN 3.4*    Recent Labs Lab 05/30/15 1211  LIPASE 11*   CBC:  Recent Labs Lab 05/30/15 1211  06/02/15 0510 06/03/15 0540 06/04/15 0525 06/04/15 1236 06/06/15 0510  WBC 14.2*  < > 4.8 7.6 6.8 7.5 7.2  NEUTROABS 12.3*  --    --   --   --   --   --   HGB 11.3*  < > 10.0* 10.1* 10.3* 10.4* 10.2*  HCT 36.4  < > 31.2* 31.8* 32.9* 32.6* 31.5*  MCV 87.9  < > 89.1 86.2 87.5 87.4 86.8  PLT 476*  < > 291 329 384 381 348  < > = values in this interval not displayed.  Recent Results (from the past 240 hour(s))  Urine culture     Status: None   Collection Time: 05/30/15 11:35 AM  Result Value Ref Range Status   Specimen Description URINE, CLEAN CATCH  Final   Special Requests NONE  Final   Colony Count   Final    35,000 COLONIES/ML Performed at [facility]    Culture   Final    Multiple bacterial morphotypes present, none predominant. Suggest appropriate recollection if clinically indicated. Performed at [facility]  Report Status 05/31/2015 FINAL  Final  Urine culture     Status: None   Collection Time: 05/31/15  3:27 AM  Result Value Ref Range Status   Specimen Description URINE, RANDOM  Final   Special Requests NONE  Final   Colony Count NO GROWTH Performed at [facility]   Final   Culture NO GROWTH Performed at [facility]   Final   Report Status 06/01/2015 FINAL  Final  Clostridium Difficile by PCR (not at [facility])     Status: None   Collection Time: 06/01/15  5:05 PM  Result Value Ref Range Status   C difficile by pcr NEGATIVE NEGATIVE Final     Scheduled Meds: . heparin  5,000 Units Subcutaneous 3 times per day  . levETIRAcetam  1,000 mg Intravenous Q12H  . peg 3350 powder  0.5 kit Oral Once   And  . peg 3350 powder  0.5 kit Oral Once  . sodium chloride  3 mL Intravenous Q12H   Continuous Infusions: . 0.9 % NaCl with KCl 40 mEq / L 50 mL/hr (06/06/15 3475)

## 2015-06-06 NOTE — Progress Notes (Signed)
Patient ID: [name], female   DOB: 16-Dec-1946, 69 y.o.   MRN: [id number]    Progress Note   Subjective  Smiling..was happy to have a popsicle.. Denies abdominal pain, or bloating-tolerated Miralax yesterday and had  4-5 loose stools   Objective   Vital signs in last 24 hours: Temp:  [97.7 F (36.5 C)-99.1 F (37.3 C)] 98.3 F (36.8 C) (06/12 0610) Pulse Rate:  [96-113] 96 (06/12 0610) Resp:  [18] 18 (06/12 0610) BP: (132-143)/(69-86) 132/86 mmHg (06/12 0610) SpO2:  [97 %-98 %] 98 % (06/12 0610) Last BM Date: 06/26/2015 General:  Older WF  white female in NAD Heart:  Regular rate and rhythm; no murmurs Lungs: Respirations even and unlabored, lungs CTA bilaterally Abdomen:  Soft, nontender and nondistended.BS decreased Extremities:  Without edema. Neurologic:  Alert and oriented,  grossly normal neurologically. Psych:  Cooperative. Normal mood and affect.  Intake/Output from previous day: 2023/06/26 0701 - 06/12 0700 In: 1021.7 [I.V.:1021.7] Out: 875 [Urine:750; Emesis/NG output:125] Intake/Output this shift:    Lab Results:  Recent Labs  06/04/15 0525 06/04/15 1236 06/06/15 0510  WBC 6.8 7.5 7.2  HGB 10.3* 10.4* 10.2*  HCT 32.9* 32.6* 31.5*  PLT 384 381 348   BMET  Recent Labs  06/04/15 0525 Jun 26, 2015 0518 06/06/15 0510  NA 138 137 140  K 3.7 4.0 4.3  CL 103 102 103  CO2 20* 21* 24  GLUCOSE 77 78 102*  BUN 6 <5* <5*  CREATININE 0.54 0.43* 0.54  CALCIUM 8.3* 8.5* 8.8*   LFT No results for input(s): PROT, ALBUMIN, AST, ALT, ALKPHOS, BILITOT, BILIDIR, IBILI in the last 72 hours. PT/INR No results for input(s): LABPROT, INR in the last 72 hours.  Studies/Results: Dg Abd 2 Views  06/26/2015   CLINICAL DATA:  Small bowel obstruction.  EXAM: ABDOMEN - 2 VIEW  COMPARISON:  06/04/2015  FINDINGS: Enteric tube remains in place, with tip now directed laterally in the region of the gastric fundus and side hole near the level the GE junction. Right upper quadrant  abdominal surgical clips are noted. There is no evidence of intraperitoneal free air. Mildly to moderately dilated small bowel loops are again seen in the mid abdomen, predominantly left-sided with overall small bowel distension slightly improved from the prior study. A few small bowel air-fluid levels are again seen. Gas is again noted in nondilated colon. No acute osseous abnormality.  IMPRESSION: Dilated small bowel loops consistent with obstruction, slightly improved from prior.   Electronically Signed   By: Logan Bores   On: 06-26-2015 08:24   Dg Abd 2 Views  06/04/2015   CLINICAL DATA:  Small bowel obstruction. Abdominal pain, distention.  EXAM: ABDOMEN - 2 VIEW  COMPARISON:  06/03/2015  FINDINGS: NG tube is in the stomach. Mildly dilated mid abdominal small bowel loops are again noted, similar to prior study. Gas within nondistended colon. Findings compatible with partial small bowel obstruction, unchanged. Prior cholecystectomy. No organomegaly or acute bony abnormality.  IMPRESSION: Continued small bowel obstruction pattern, not significantly changed.   Electronically Signed   By: Rolm Baptise M.D.   On: 06/04/2015 11:18      Assessment / Plan:    #1 69 yo female with SBO (mid bowel) and a cecal mass on CT- Day #7 hospital stay- she appears to have finally opened up at least partially- she is tolerating NG clamped and getting some results with Miralax via NG Will try a Moviprep today via NG- if she becomes  uncomfortable can reconnect NG to suction Will schedule for Colonoscopy with Dr Ardis Hughs tomorrow -if able to tolerate prep  Then will need to proceed with surgery #2 nutrition - day #7 NPO, needs TNA #3 MR   Principal Problem:   SBO (small bowel obstruction) Active Problems:   Mental retardation   Convulsions   HLD (hyperlipidemia)   Colonic mass   Small bowel obstruction   UTI (lower urinary tract infection)   Protein-calorie malnutrition, severe    LOS: 7 days   Amy  Esterwood  06/06/2015, 8:57 AM     Attending physician's note   I have taken an interval history, reviewed the chart and examined the patient. I agree with the Advanced Practitioner's note, impression and recommendations. Overall she appears more comfortable. Several bowel movements with Miralax. Tolerating NGT clamping. Attempt to prep bowel for colonoscopy tomorrow with Moviprep per NGT.  [name]. Fuller Plan, MD [facility]

## 2015-06-07 ENCOUNTER — Inpatient Hospital Stay (HOSPITAL_COMMUNITY): Payer: Medicare Other

## 2015-06-07 ENCOUNTER — Encounter (HOSPITAL_COMMUNITY): Payer: Self-pay

## 2015-06-07 ENCOUNTER — Inpatient Hospital Stay (HOSPITAL_COMMUNITY): Payer: Medicare Other | Admitting: Anesthesiology

## 2015-06-07 ENCOUNTER — Encounter (HOSPITAL_COMMUNITY)
Admission: EM | Disposition: A | Discharge status: Home / Self Care | Payer: Self-pay | Source: Home / Self Care | Attending: Internal Medicine

## 2015-06-07 HISTORY — PX: COLONOSCOPY WITH PROPOFOL: SHX5780

## 2015-06-07 LAB — BASIC METABOLIC PANEL
ANION GAP: 11 (ref 5–15)
BUN: <5 mg/dL — ABNORMAL LOW (ref 6–20)
CHLORIDE: 105 mmol/L (ref 101–111)
CO2: 22 mmol/L (ref 22–32)
Calcium: 8.6 mg/dL — ABNORMAL LOW (ref 8.9–10.3)
Creatinine, Ser: 0.48 mg/dL (ref 0.44–1.00)
GFR calc Af Amer: >60 mL/min (ref >60)
GFR calc non Af Amer: >60 mL/min (ref >60)
Glucose, Bld: 103 mg/dL — ABNORMAL HIGH (ref 65–99)
Potassium: 4.2 mmol/L (ref 3.5–5.1)
SODIUM: 138 mmol/L (ref 135–145)

## 2015-06-07 LAB — MAGNESIUM: MAGNESIUM: 1.8 mg/dL (ref 1.7–2.4)

## 2015-06-07 SURGERY — COLONOSCOPY WITH PROPOFOL
Anesthesia: General

## 2015-06-07 MED ORDER — LACTATED RINGERS IV SOLN
INTRAVENOUS | Status: DC | PRN
  Administered 2015-06-07: 10:00:00 via INTRAVENOUS

## 2015-06-07 MED ORDER — METRONIDAZOLE 500 MG PO TABS
1000.0000 mg | ORAL_TABLET | ORAL | Status: AC
  Administered 2015-06-07 (×3): 1000 mg via ORAL
  Filled 2015-06-07 (×3): qty 2

## 2015-06-07 MED ORDER — DEXTROSE 5 % IV SOLN
2.0000 g | INTRAVENOUS | Status: AC
  Administered 2015-06-08: 2 g via INTRAVENOUS
  Filled 2015-06-07: qty 2

## 2015-06-07 MED ORDER — LIDOCAINE HCL (CARDIAC) 20 MG/ML IV SOLN
INTRAVENOUS | Status: DC | PRN
  Administered 2015-06-07: 50 mg via INTRAVENOUS

## 2015-06-07 MED ORDER — PROPOFOL 500 MG/50ML IV EMUL
INTRAVENOUS | Status: DC | PRN
  Administered 2015-06-07: 100 mg via INTRAVENOUS

## 2015-06-07 MED ORDER — CHLORHEXIDINE GLUCONATE CLOTH 2 % EX PADS
6.0000 | MEDICATED_PAD | Freq: Once | CUTANEOUS | Status: AC
  Administered 2015-06-07: 6 via TOPICAL

## 2015-06-07 MED ORDER — LIDOCAINE HCL (CARDIAC) 20 MG/ML IV SOLN
INTRAVENOUS | Status: AC
  Filled 2015-06-07: qty 5

## 2015-06-07 MED ORDER — PROPOFOL 10 MG/ML IV BOLUS
INTRAVENOUS | Status: AC
  Filled 2015-06-07: qty 20

## 2015-06-07 MED ORDER — SUCCINYLCHOLINE CHLORIDE 20 MG/ML IJ SOLN
INTRAMUSCULAR | Status: DC | PRN
  Administered 2015-06-07: 100 mg via INTRAVENOUS

## 2015-06-07 MED ORDER — CHLORHEXIDINE GLUCONATE CLOTH 2 % EX PADS
6.0000 | MEDICATED_PAD | Freq: Once | CUTANEOUS | Status: AC
  Administered 2015-06-08: 6 via TOPICAL

## 2015-06-07 MED ORDER — NEOMYCIN SULFATE 500 MG PO TABS
1000.0000 mg | ORAL_TABLET | ORAL | Status: AC
  Administered 2015-06-07 (×3): 1000 mg via ORAL
  Filled 2015-06-07 (×3): qty 2

## 2015-06-07 MED ORDER — ONDANSETRON HCL 4 MG/2ML IJ SOLN
INTRAMUSCULAR | Status: AC
  Filled 2015-06-07: qty 2

## 2015-06-07 SURGICAL SUPPLY — 21 items

## 2015-06-07 NOTE — Progress Notes (Signed)
[place] for assistance with IV Keppra Dosing Indication: Hx seizure disorders; currently NPO  No Known Allergies  Patient Measurements: Height: 5\' 4"  (162.6 cm) (per chart) Weight: 190 lb (86.183 kg) (per chart) IBW/kg (Calculated) : 54.7   Vital Signs: Temp: 97.4 F (36.3 C) (06/13 0847) Temp Source: Tympanic (06/13 0847) BP: 167/89 mmHg (06/13 0847) Pulse Rate: 119 (06/13 0847) Intake/Output from previous day: 06/12 0701 - 06/13 0700 In: -  Out: 890 [Urine:125; Stool:765] Intake/Output from this shift:    Labs:  Recent Labs  06/04/15 1236 06/05/15 0518 06/06/15 0510 06/07/15 0445  WBC 7.5  --  7.2  --   HGB 10.4*  --  10.2*  --   HCT 32.6*  --  31.5*  --   PLT 381  --  348  --   CREATININE  --  0.43* 0.54 0.48  MG  --  1.8 1.8 1.8   Estimated Creatinine Clearance: 69.5 mL/min (by C-G formula based on Cr of 0.48).     Medical History: Past Medical History  Diagnosis Date  . Hypercholesteremia   . Constipation   . Osteoporosis   . Seizure   . Edema   . Mental retardation   . SBO (small bowel obstruction) 11/2013, 05/2015    likely due to adhesions  . Colonic mass 05/2015    right, concerning for cancer    Medications:  Scheduled Meds: . levETIRAcetam  1,000 mg Intravenous Q12H  . sodium chloride  3 mL Intravenous Q12H   Continuous Infusions: . 0.9 % NaCl with KCl 40 mEq / L 50 mL/hr (06/06/15 0312)   PRN Meds:.acetaminophen **OR** acetaminophen, hydrALAZINE, HYDROmorphone (DILAUDID) injection, ondansetron (ZOFRAN) IV  Assessment: 69 y/o F with PMH that includes seizure disorder and mental retardation, on felbamate prior to admission, now with SBO and unable to tolerate oral medications.  Unfortunately there is no IV form of felbamate available.  Discussed with attending MD 6/6 - decision was made to use IV Keppra for now and pharmacy assistance with dosing was requested.   Today, 06/07/2015:  Remains  on Keppra 1 gram IV q12h  No seizures or adverse effects from [place] reported in latest notes.   For colonoscopy today to assess R colon mass and probable surgery on 6/14 or 6/15.  Goal of Therapy:  Seizure prevention  Plan:   Continue Keppra at 1000 mg IV q12h.  Follow clinical course.  When patient can safely tolerate oral or enteral medications, resume felbamate PO or via tube at PTA dosage (1200 mg BID) and stop Keppra.  Clayburn Pert, PharmD, BCPS Pager: [phone] 06/07/2015  10:07 AM

## 2015-06-07 NOTE — Op Note (Signed)
[facility] [place], [id number]   COLONOSCOPY PROCEDURE REPORT  PATIENT: [name], [name]  MR#: [id number] BIRTHDATE: 30-May-1946 , 61  yrs. old GENDER: female ENDOSCOPIST: [name], MD REFERRED VX:[id number] Sindy Guadeloupe, M.D, [facility] PROCEDURE DATE:  06/07/2015 PROCEDURE:   Colonoscopy, diagnostic and Colonoscopy with biopsy First Screening Colonoscopy - Avg.  risk and is 50 yrs.  old or older - No.  Prior Negative Screening - Now for repeat screening. N/A  History of Adenoma - Now for follow-up colonoscopy & has been > or = to 3 yrs.  N/A  other ASA CLASS:   Class III INDICATIONS:right colon mass on CT scan, adjacent adenopathy; also mid SBO that has clinically resolved. MEDICATIONS: Per Anesthesia  DESCRIPTION OF PROCEDURE:   After the risks benefits and alternatives of the procedure were thoroughly explained, informed consent was obtained.  The digital rectal exam revealed no abnormalities of the rectum.   The Pentax Ped Colon [id number] endoscope was introduced through the anus and advanced to the cecum, which was identified by both the appendix and ileocecal valve. No adverse events experienced.   The quality of the prep was excellent.  The instrument was then slowly withdrawn as the colon was fully examined. Estimated blood loss is zero unless otherwise noted in this procedure report.   COLON FINDINGS: There was a non-circumferential mass in the ascending colon (just distal to the IC valve), measured about 6-7cm long.  The mass was not obstructive however it was clearly malignant.  Multiple biopsies were taken.  The examination was otherwise normal.  Retroflexed views revealed no abnormalities. The time to cecum = 4 min      Withdrawal time = 7 min        The scope was withdrawn and the procedure completed. COMPLICATIONS: There were no immediate complications.  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  The mass was not obstructive.  Multiple biopsies were taken. The examination was otherwise normal  RECOMMENDATIONS: She will likely need repeat colonoscopy in a year (after surgical resection this week).  eSigned:  [name], MD 06/07/2015 10:22 AM

## 2015-06-07 NOTE — Progress Notes (Signed)
Patient ID: [name], female   DOB: 1946-06-06, 69 y.o.   MRN: [id number]    Subjective: Pt feels well this morning.  No nausea with NGT clamped.  Patient denies chest pain or SOB. (new onset tachycardia)  Objective: Vital signs in last 24 hours: Temp:  [97.9 F (36.6 C)-99.4 F (37.4 C)] 97.9 F (36.6 C) (06/13 0700) Pulse Rate:  [121-135] 121 (06/13 0700) Resp:  [16-20] 20 (06/13 0700) BP: (120-159)/(78-119) 120/78 mmHg (06/13 0700) SpO2:  [97 %-98 %] 97 % (06/13 0700) Weight:  [86.232 kg (190 lb 1.7 oz)-86.7 kg (191 lb 2.2 oz)] 86.232 kg (190 lb 1.7 oz) (06/13 0537) Last BM Date: 06/06/15  Intake/Output from previous day: 06/12 0701 - 06/13 0700 In: -  Out: 890 [Urine:125; Stool:765] Intake/Output this shift:    PE: Heart: tachy, but sounds regular Lungs: CTAB Abd: soft, NT, ND, +BS  Lab Results:   Recent Labs  06/04/15 1236 06/06/15 0510  WBC 7.5 7.2  HGB 10.4* 10.2*  HCT 32.6* 31.5*  PLT 381 348   BMET  Recent Labs  06/06/15 0510 06/07/15 0445  NA 140 138  K 4.3 4.2  CL 103 105  CO2 24 22  GLUCOSE 102* 103*  BUN <5* <5*  CREATININE 0.54 0.48  CALCIUM 8.8* 8.6*   PT/INR No results for input(s): LABPROT, INR in the last 72 hours. CMP     Component Value Date/Time   NA 138 06/07/2015 0445   K 4.2 06/07/2015 0445   CL 105 06/07/2015 0445   CO2 22 06/07/2015 0445   GLUCOSE 103* 06/07/2015 0445   BUN <5* 06/07/2015 0445   CREATININE 0.48 06/07/2015 0445   CALCIUM 8.6* 06/07/2015 0445   PROT 7.6 05/30/2015 1211   ALBUMIN 3.4* 05/30/2015 1211   AST 30 05/30/2015 1211   ALT 17 05/30/2015 1211   ALKPHOS 64 05/30/2015 1211   BILITOT <0.1* 05/30/2015 1211   GFRNONAA >60 06/07/2015 0445   GFRAA >60 06/07/2015 0445   Lipase     Component Value Date/Time   LIPASE 11* 05/30/2015 1211       Studies/Results: Dg Abd 2 Views  07-03-2015   CLINICAL DATA:  Small bowel obstruction.  EXAM: ABDOMEN - 2 VIEW  COMPARISON:  06/04/2015  FINDINGS:  Enteric tube remains in place, with tip now directed laterally in the region of the gastric fundus and side hole near the level the GE junction. Right upper quadrant abdominal surgical clips are noted. There is no evidence of intraperitoneal free air. Mildly to moderately dilated small bowel loops are again seen in the mid abdomen, predominantly left-sided with overall small bowel distension slightly improved from the prior study. A few small bowel air-fluid levels are again seen. Gas is again noted in nondilated colon. No acute osseous abnormality.  IMPRESSION: Dilated small bowel loops consistent with obstruction, slightly improved from prior.   Electronically Signed   By: Logan Bores   On: 03-Jul-2015 08:24    Anti-infectives: Anti-infectives    Start     Dose/Rate Route Frequency Ordered Stop   05/31/15 0800  cefTRIAXone (ROCEPHIN) 1 g in dextrose 5 % 50 mL IVPB - Premix  Status:  Discontinued     1 g 100 mL/hr over 30 Minutes Intravenous Every 24 hours 05/31/15 0742 06/04/15 1143       Assessment/Plan  1. SBO -resolved 2. Right colon mass -for colonoscopy today -patient has completed her prep and tolerated it well. -plan on OR for this  area likely tomorrow or Wednesday, pending work up 3.Tachycardia -patient has tachycardia on admission, but dissipated on its own. -began again yesterday, sustaining in 120-130s.  EKG shows sinus tachy with prolonged QT.  No other concerning findings noted.  Patient without CP, SOB  LOS: 8 days    Kyrie Fludd E 06/07/2015, 8:00 AM Pager: [phone]

## 2015-06-07 NOTE — Anesthesia Procedure Notes (Signed)
Procedure Name: Intubation Date/Time: 06/07/2015 9:50 AM Performed by: Glory Buff Pre-anesthesia Checklist: Patient identified, Emergency Drugs available, Suction available and Patient being monitored Patient Re-evaluated:Patient Re-evaluated prior to inductionOxygen Delivery Method: Circle System Utilized Preoxygenation: Pre-oxygenation with 100% oxygen Intubation Type: IV induction, Cricoid Pressure applied and Rapid sequence Laryngoscope Size: Miller and 3 Grade View: Grade I Tube type: Oral Tube size: 7.5 mm Number of attempts: 1 Airway Equipment and Method: Oral airway and Bougie stylet Placement Confirmation: ETT inserted through vocal cords under direct vision,  positive ETCO2 and breath sounds checked- equal and bilateral Secured at: 21 cm Tube secured with: Tape Dental Injury: Teeth and Oropharynx as per pre-operative assessment  Difficulty Due To: Difficult Airway- due to limited oral opening Future Recommendations: Recommend- induction with short-acting agent, and alternative techniques readily available

## 2015-06-07 NOTE — Progress Notes (Signed)
Patient ID: [name], female   DOB: Aug 15, 1946, 69 y.o.   MRN: [id number]  [facility] PROGRESS NOTE  YOLAND SCHERR DJM:[id number] DOB: Sep 27, 1946 DOA: 05/30/2015 PCP: Pcp Not In System   Brief narrative:    69 year old female with hyperlipidemia, osteoporosis, prior history of small bowel obstruction, constipation was brought to ED with main concern of several days duration of progressively worsening abd pain, per family member described as constant and throbbing, unknown severity, associated with poor oral intake, nausea and non bloody vomiting.   CT abdomen and pelvis showed recurrent high-grade mid to distal small bowel obstruction with transition point in the periumbilical region, infiltrating mass involving the right colon highly suspicious for colon cancer, at bedtime and prominent mesenteric lymph nodes and/or peritoneal nodules concerning for metastatic disease.   Major events since admission: 6/8 - NG tube placed 6/9 - slight improvement, electrolyte still depleted, needs more potassium 6/10 - no significant improvement in SBP per imaging studies but overall pt is stable  6/13 - tachy, slightly prolonged QT interval on tele, colonoscopy results below, plan for lap chole in AM  Assessment/Plan:    High-grade SBO (small bowel obstruction): Also new diagnosis of colon mass - Continue NPO, NG tube placed 06/02/2015 and now out 6/13 - denies any further nausea, vomiting or abdominal pain, CEA elevated at 27.1 - Appreciating GI and surgery team assistance  - colonoscopy results below, worrisome for malignancy - plan on lap assisted right colectomy tomorrow  Active Problems:  Mental retardation - currently at her baseline mental status   HLD (hyperlipidemia) - Currently NPO    Hypokalemia - Patient receiving KCl with IV fluids - K is now WNL, Mg still 1.8 despite supplementing  - keep K ~4 and Mg ~2 - repeat BMP and Mg in a.m.     Accelerated HTN - placed on  Hydralazine as needed and blood pressure better controlled   UTI - Urine culture showed only 35,000 colonies, currently on IV Rocephin, remains afebrile  - completed 5 days of Rocephin 06/03/2015    History of seizures - Unfortunately, no IV substitute of Felbamate, discussed with pharmacy and placed on IV Keppra for now     Severe protein calorie malnutrition in the context of acute illness  - appreciate nutritionist input    Obesity  - Body mass index is 32.6 kg/(m^2).  DVT prophylaxis - Heparin SQ  Code Status: Full.  Family Communication:  plan of care discussed with the patient, no family at bedside  Disposition Plan: Not ready for d/c  IV access:  Peripheral IV  Procedures and diagnostic studies:    Dg Chest 2 View 05/30/2015   Cardiomegaly with vascular congestion and basilar atelectasis.  Marked gastric distention.     Dg Abd 1 View 06/02/2015  Enteric tube tip likely in the gastric fundus. 2. Moderate small bowel dilatation consistent with obstruction, improved   Ct Abdomen Pelvis W Contrast 05/30/2015  Recurrent high-grade mid to distal small bowel obstruction with transition point in the periumbilical region. 2. Strong concern of infiltrating mass involving the right colon, highly suspicious for colon cancer. There are adjacent prominent mesenteric lymph nodes and/or peritoneal nodules concerning for metastatic disease. No distant metastases identified. This colon mass does not appear to be the etiology for the bowel obstruction. 3. Small amount of free pelvic fluid without generalized peritoneal nodularity. 4. Generally stable biliary dilatation status post cholecystectomy.   E  Dg Abd 2 Views 06/01/2015  High-grade partial  small bowel obstruction, without significant change compared to recent CT.     Dg Abd Acute W/chest 06/03/2015  Dilated small bowel loops show mild interval improvement from the prior study consistent with small bowel obstruction.  Dg Abd 2  Views 06/04/2015   Continued small bowel obstruction pattern, not significantly changed.    Dg Abd 2 Views 06/05/2015  Dilated small bowel loops consistent with obstruction, slightly improved from prior.   Dg Abd 2 Views 06/07/2015  Findings most compatible with improving small bowel obstruction.    Colonoscopy by Dr. 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 The mass was not obstructive. Multiple biopsies were taken. The examination was otherwise normal RECOMMENDATIONS: She will likely need repeat colonoscopy in a year (after surgical resection).  Medical Consultants:  GI Surgery   Other Consultants:  PT  IAnti-Infectives:   Rocephin 6/5 --> 6/9  Faye Ramsay, MD  [facility] Pager [phone]  If 7PM-7AM, please contact night-coverage [email or web address] Password [facility] 06/07/2015, 7:45 PM   LOS: 8 days   HPI/Subjective: No events overnight.   Objective: Filed Vitals:   06/07/15 0847 06/07/15 1023 06/07/15 1110 06/07/15 1410  BP: 167/89  120/78 126/79  Pulse: 119 115 120 125  Temp: 97.4 F (36.3 C) 97.6 F (36.4 C) 98.2 F (36.8 C) 98.5 F (36.9 C)  TempSrc: Tympanic Oral Oral Oral  Resp: 24 12 20 20   Height: 5\' 4"  (1.626 m)     Weight: 86.183 kg (190 lb)     SpO2: 97% 98% 97% 98%    Intake/Output Summary (Last 24 hours) at 06/07/15 1945 Last data filed at 06/07/15 1022  Gross per 24 hour  Intake    550 ml  Output    890 ml  Net   -340 ml    Exam:   General:  Pt is alert, follows commands appropriately, not in acute distress  Cardiovascular: Regular rhythm, tachy, S1/S2, no rubs, no gallops  Respiratory: Clear to auscultation bilaterally, no wheezing, diminished breath sounds at bases   Abdomen: Soft, non tender, slightly distended, very faint BS, NGT in place   Data Reviewed: Basic Metabolic Panel:  Recent Labs Lab  06/03/15 0540 06/04/15 0525 06/05/15 0518 06/06/15 0510 06/07/15 0445  NA 136 138 137 140 138  K 3.0* 3.7 4.0 4.3 4.2  CL 103 103 102 103 105  CO2 20* 20* 21* 24 22  GLUCOSE 82 77 78 102* 103*  BUN 7 6 <5* <5* <5*  CREATININE 0.55 0.54 0.43* 0.54 0.48  CALCIUM 8.2* 8.3* 8.5* 8.8* 8.6*  MG 1.7 1.9 1.8 1.8 1.8   CBC:  Recent Labs Lab 06/02/15 0510 06/03/15 0540 06/04/15 0525 06/04/15 1236 06/06/15 0510  WBC 4.8 7.6 6.8 7.5 7.2  HGB 10.0* 10.1* 10.3* 10.4* 10.2*  HCT 31.2* 31.8* 32.9* 32.6* 31.5*  MCV 89.1 86.2 87.5 87.4 86.8  PLT 291 329 384 381 348    Recent Results (from the past 240 hour(s))  Urine culture     Status: None   Collection Time: 05/30/15 11:35 AM  Result Value Ref Range Status   Specimen Description URINE, CLEAN CATCH  Final   Special Requests NONE  Final   Colony Count   Final    35,000 COLONIES/ML Performed at [facility]    Culture   Final    Multiple bacterial morphotypes present, none predominant. Suggest appropriate recollection if  clinically indicated. Performed at [facility]    Report Status 05/31/2015 FINAL  Final  Urine culture     Status: None   Collection Time: 05/31/15  3:27 AM  Result Value Ref Range Status   Specimen Description URINE, RANDOM  Final   Special Requests NONE  Final   Colony Count NO GROWTH Performed at [facility]   Final   Culture NO GROWTH Performed at [facility]   Final   Report Status 06/01/2015 FINAL  Final  Clostridium Difficile by PCR (not at [facility])     Status: None   Collection Time: 06/01/15  5:05 PM  Result Value Ref Range Status   C difficile by pcr NEGATIVE NEGATIVE Final     Scheduled Meds: . [START ON 06/08/2015] cefoTEtan (CEFOTAN) 2 GM IVPB  2 g Intravenous On Call to OR  . Chlorhexidine Gluconate Cloth  6 each Topical Once   And  . Chlorhexidine Gluconate Cloth  6 each Topical Once  . levETIRAcetam  1,000 mg Intravenous Q12H  . metroNIDAZOLE  1,000 mg  Oral 3 times per day on Mon  . neomycin  1,000 mg Oral 3 times per day on Mon  . sodium chloride  3 mL Intravenous Q12H   Continuous Infusions: . 0.9 % NaCl with KCl 40 mEq / L 50 mL/hr (06/06/15 3212)

## 2015-06-07 NOTE — Anesthesia Postprocedure Evaluation (Signed)
  Anesthesia Post-op Note  Patient: [name]  Procedure(s) Performed: Procedure(s) (LRB): COLONOSCOPY WITH PROPOFOL (N/A)  Patient Location: PACU  Anesthesia Type: General  Level of Consciousness: awake and alert   Airway and Oxygen Therapy: Patient Spontanous Breathing  Post-op Pain: mild  Post-op Assessment: Post-op Vital signs reviewed, Patient's Cardiovascular Status Stable, Respiratory Function Stable, Patent Airway and No signs of Nausea or vomiting  Last Vitals:  Filed Vitals:   06/07/15 1110  BP: 120/78  Pulse: 120  Temp: 36.8 C  Resp: 20    Post-op Vital Signs: stable   Complications: No apparent anesthesia complications

## 2015-06-07 NOTE — Anesthesia Preprocedure Evaluation (Addendum)
Anesthesia Evaluation  Patient identified by MRN, date of birth, ID band Patient awake    Reviewed: Allergy & Precautions, H&P , NPO status , Patient's Chart, lab work & pertinent test results  Airway Mallampati: III  TM Distance: <3 FB Neck ROM: full    Dental  (+) Poor Dentition, Dental Advisory Given   Pulmonary neg pulmonary ROS,  breath sounds clear to auscultation  Pulmonary exam normal       Cardiovascular Exercise Tolerance: Good negative cardio ROS Normal cardiovascular examRhythm:regular Rate:Normal     Neuro/Psych Seizures -,  MR negative psych ROS   GI/Hepatic negative GI ROS, Neg liver ROS,   Endo/Other  negative endocrine ROS  Renal/GU negative Renal ROS  negative genitourinary   Musculoskeletal   Abdominal   Peds  Hematology negative hematology ROS (+) anemia ,   Anesthesia Other Findings   Reproductive/Obstetrics negative OB ROS                            Anesthesia Physical Anesthesia Plan  ASA: III  Anesthesia Plan: General   Post-op Pain Management:    Induction: Intravenous, Rapid sequence and Cricoid pressure planned  Airway Management Planned: Oral ETT  Additional Equipment:   Intra-op Plan:   Post-operative Plan: Extubation in OR  Informed Consent: I have reviewed the patients History and Physical, chart, labs and discussed the procedure including the risks, benefits and alternatives for the proposed anesthesia with the patient or authorized representative who has indicated his/her understanding and acceptance.   Dental Advisory Given  Plan Discussed with: CRNA and Surgeon  Anesthesia Plan Comments:        Anesthesia Quick Evaluation

## 2015-06-07 NOTE — Progress Notes (Addendum)
Clarification: Dr. Lucia Gaskins will plan on lap assisted right colectomy tomorrow for this patient.  I will get in touch with her POA to discuss this procedure so she can sign consent.  OSBORNE,KELLY E 2:46 PM 06/07/2015  Agree with above.  Previous note signed by Dr. Marcello Moores. The patient really does not understand well what is going on.  She is worried about when she will eat. Claiborne Billings spoke to her POA on the phone. For surgery on Tuesday, 6/14.  Alphonsa Overall, MD, [facility] Surgery Pager: [phone] Office phone:  [phone]

## 2015-06-07 NOTE — Progress Notes (Signed)
OT Cancellation Note  Patient Details Name: COLLEN HOSTLER MRN: [id number] DOB: [birth year]   Cancelled Treatment:    Reason Eval/Treat Not Completed: Patient at procedure or test/ unavailable  Will recheck on pt next day  Betsy Pries 06/07/2015, 9:33 AM

## 2015-06-07 NOTE — Progress Notes (Signed)
     [facility] Progress Note  Subjective:  Tol bowel prep last night. Moving bowels now--watery.NO N/V. No abd pain.Noted to have new onet tachycardia, denies CP or SOB.     Objective:  Vital signs in last 24 hours: Temp:  [97.9 F (36.6 C)-99.4 F (37.4 C)] 97.9 F (36.6 C) (06/13 0700) Pulse Rate:  [121-135] 121 (06/13 0700) Resp:  [16-20] 20 (06/13 0700) BP: (120-159)/(78-119) 120/78 mmHg (06/13 0700) SpO2:  [97 %-98 %] 97 % (06/13 0700) Weight:  [190 lb 1.7 oz (86.232 kg)-191 lb 2.2 oz (86.7 kg)] 190 lb 1.7 oz (86.232 kg) (06/13 0537) Last BM Date: 06/06/15 General:   Alert,  Well-developed,  in NAD Heart:  Regular rate and rhythm; no murmurs Pulm;clear Abdomen:  Soft, nontender and nondistended. Normal bowel sounds, without guarding, and without rebound.   Extremities:  Without edema. Neurologic:  Alert and  oriented x4. Psych:  Alert and cooperative.   Intake/Output from previous day: 06/12 0701 - 06/13 0700 In: -  Out: 890 [Urine:125; Stool:765] Intake/Output this shift:    Lab Results:  Recent Labs  06/04/15 1236 06/06/15 0510  WBC 7.5 7.2  HGB 10.4* 10.2*  HCT 32.6* 31.5*  PLT 381 348   BMET  Recent Labs  06/05/15 0518 06/06/15 0510 06/07/15 0445  NA 137 140 138  K 4.0 4.3 4.2  CL 102 103 105  CO2 21* 24 22  GLUCOSE 78 102* 103*  BUN <5* <5* <5*  CREATININE 0.43* 0.54 0.48  CALCIUM 8.5* 8.8* 8.6*    ASSESSMENT/PLAN:   #1 69 yo female with SBO (mid bowel) and a cecal mass on CT- Day #8hospital stay- she appears to have finally opened up at least partially-Tol prep last pm. For colonoscopy today. #2 nutrition - day #8 NPO, needs TNA #3. Tachycardia. Pt came in tachycardic, rate then went to (), now in 120s.EKG with sinus tach, long QT.   Principal Problem:   SBO (small bowel obstruction) Active Problems:   Mental retardation   Convulsions   HLD (hyperlipidemia)   Colonic mass   Small bowel obstruction   UTI (lower  urinary tract infection)   Protein-calorie malnutrition, severe     LOS: 8 days   [name], Vita Barley PA-C 06/07/2015, Pager [phone]

## 2015-06-07 NOTE — Transfer of Care (Signed)
Immediate Anesthesia Transfer of Care Note  Patient: [name]  Procedure(s) Performed: Procedure(s): COLONOSCOPY WITH PROPOFOL (N/A)  Patient Location: PACU  Anesthesia Type:General  Level of Consciousness: awake, alert  and patient cooperative  Airway & Oxygen Therapy: Patient Spontanous Breathing and Patient connected to face mask oxygen  Post-op Assessment: Report given to RN and Post -op Vital signs reviewed and stable  Post vital signs: Reviewed and stable  Last Vitals:  Filed Vitals:   06/07/15 0847  BP: 167/89  Pulse: 119  Temp: 36.3 C  Resp: 24    Complications: No apparent anesthesia complications

## 2015-06-07 NOTE — H&P (View-Only) (Signed)
Patient ID: [name], female   DOB: 1946/08/11, 69 y.o.   MRN: [id number]    Progress Note   Subjective  Smiling..was happy to have a popsicle.. Denies abdominal pain, or bloating-tolerated Miralax yesterday and had  4-5 loose stools   Objective   Vital signs in last 24 hours: Temp:  [97.7 F (36.5 C)-99.1 F (37.3 C)] 98.3 F (36.8 C) (06/12 0610) Pulse Rate:  [96-113] 96 (06/12 0610) Resp:  [18] 18 (06/12 0610) BP: (132-143)/(69-86) 132/86 mmHg (06/12 0610) SpO2:  [97 %-98 %] 98 % (06/12 0610) Last BM Date: 06-16-2015 General:  Older WF  white female in NAD Heart:  Regular rate and rhythm; no murmurs Lungs: Respirations even and unlabored, lungs CTA bilaterally Abdomen:  Soft, nontender and nondistended.BS decreased Extremities:  Without edema. Neurologic:  Alert and oriented,  grossly normal neurologically. Psych:  Cooperative. Normal mood and affect.  Intake/Output from previous day: 2023/06/16 0701 - 06/12 0700 In: 1021.7 [I.V.:1021.7] Out: 875 [Urine:750; Emesis/NG output:125] Intake/Output this shift:    Lab Results:  Recent Labs  06/04/15 0525 06/04/15 1236 06/06/15 0510  WBC 6.8 7.5 7.2  HGB 10.3* 10.4* 10.2*  HCT 32.9* 32.6* 31.5*  PLT 384 381 348   BMET  Recent Labs  06/04/15 0525 June 16, 2015 0518 06/06/15 0510  NA 138 137 140  K 3.7 4.0 4.3  CL 103 102 103  CO2 20* 21* 24  GLUCOSE 77 78 102*  BUN 6 <5* <5*  CREATININE 0.54 0.43* 0.54  CALCIUM 8.3* 8.5* 8.8*   LFT No results for input(s): PROT, ALBUMIN, AST, ALT, ALKPHOS, BILITOT, BILIDIR, IBILI in the last 72 hours. PT/INR No results for input(s): LABPROT, INR in the last 72 hours.  Studies/Results: Dg Abd 2 Views  2015/06/16   CLINICAL DATA:  Small bowel obstruction.  EXAM: ABDOMEN - 2 VIEW  COMPARISON:  06/04/2015  FINDINGS: Enteric tube remains in place, with tip now directed laterally in the region of the gastric fundus and side hole near the level the GE junction. Right upper quadrant  abdominal surgical clips are noted. There is no evidence of intraperitoneal free air. Mildly to moderately dilated small bowel loops are again seen in the mid abdomen, predominantly left-sided with overall small bowel distension slightly improved from the prior study. A few small bowel air-fluid levels are again seen. Gas is again noted in nondilated colon. No acute osseous abnormality.  IMPRESSION: Dilated small bowel loops consistent with obstruction, slightly improved from prior.   Electronically Signed   By: Logan Bores   On: 2015/06/16 08:24   Dg Abd 2 Views  06/04/2015   CLINICAL DATA:  Small bowel obstruction. Abdominal pain, distention.  EXAM: ABDOMEN - 2 VIEW  COMPARISON:  06/03/2015  FINDINGS: NG tube is in the stomach. Mildly dilated mid abdominal small bowel loops are again noted, similar to prior study. Gas within nondistended colon. Findings compatible with partial small bowel obstruction, unchanged. Prior cholecystectomy. No organomegaly or acute bony abnormality.  IMPRESSION: Continued small bowel obstruction pattern, not significantly changed.   Electronically Signed   By: Rolm Baptise M.D.   On: 06/04/2015 11:18      Assessment / Plan:    #1 69 yo female with SBO (mid bowel) and a cecal mass on CT- Day #7 hospital stay- she appears to have finally opened up at least partially- she is tolerating NG clamped and getting some results with Miralax via NG Will try a Moviprep today via NG- if she becomes  uncomfortable can reconnect NG to suction Will schedule for Colonoscopy with Dr Ardis Hughs tomorrow -if able to tolerate prep  Then will need to proceed with surgery #2 nutrition - day #7 NPO, needs TNA #3 MR   Principal Problem:   SBO (small bowel obstruction) Active Problems:   Mental retardation   Convulsions   HLD (hyperlipidemia)   Colonic mass   Small bowel obstruction   UTI (lower urinary tract infection)   Protein-calorie malnutrition, severe    LOS: 7 days   Amy  Esterwood  06/06/2015, 8:57 AM     Attending physician's note   I have taken an interval history, reviewed the chart and examined the patient. I agree with the Advanced Practitioner's note, impression and recommendations. Overall she appears more comfortable. Several bowel movements with Miralax. Tolerating NGT clamping. Attempt to prep bowel for colonoscopy tomorrow with Moviprep per NGT.  [name]. Fuller Plan, MD [facility]

## 2015-06-07 NOTE — Progress Notes (Signed)
Nutrition Follow-up  DOCUMENTATION CODES:  Severe malnutrition in context of acute illness/injury, Obesity unspecified  INTERVENTION: - Will continue to monitor for need for nutrition support - Advance diet after colonoscopy if feasible to avoid need for PICC placement - RD will continue to monitor for needs  NUTRITION DIAGNOSIS:  Inadequate oral intake related to inability to eat as evidenced by NPO status. -ongoing  GOAL:  Patient will meet greater than or equal to 90% of their needs -unable to meet with NPO x8 days  MONITOR:  Other (Comment), Diet advancement, Weight trends, Labs, I & O's  ASSESSMENT: Per H&P, 69 year old female with hyperlipidemia, osteoporosis, prior history of small bowel obstruction, constipation was brought to ED due to complaints of abdominal pain with multiple episodes of nausea and vomiting. History and ER records, the caregiver at the bedside. Patient is able to provide only limited history due to her mental status, underlying MR. per the caregiver, patient started complaining of generalized abdominal pain since this morning. She had multiple episodes of nausea and vomiting at home and in the ER. She had also complained "loose stools" last night. Patient is unable to describe the abdominal pain, states that it was aching all over.   Pt remains NPO since admission (8 days). NGT clamped starting 6/10 and pt tolerating well. She has had a popsicle and tolerated well (order for NPO with "may try popsicles and water"). Pt currently out of room to colonoscopy.  Per surgery note 6/11, pt had 5 BMs over night that day. Their note 6/12 states that obstruction seems to be resolving. MD note 6/12 indicates: Strong concern of infiltrating mass involving the right colon, highly suspicious for colon cancer. There are adjacent prominent mesenteric lymph nodes and/or peritoneal nodules concerning for metastatic disease.  Pt continues to be unable to meet needs. Recommend  diet advancement following colonoscopy as pt tolerating popsicles and surgery states obstruction resolving/resolved. Medications and labs reviewed.  Height:  Ht Readings from Last 1 Encounters:  06/07/15 5\' 4"  (1.626 m)    Weight:  Wt Readings from Last 1 Encounters:  06/07/15 190 lb (86.183 kg)    Ideal Body Weight:  54.5 kg (kg)  Wt Readings from Last 10 Encounters:  06/07/15 190 lb (86.183 kg)  05/06/14 201 lb 3.2 oz (91.264 kg)  12/13/13 203 lb 12.8 oz (92.443 kg)  11/29/11 211 lb (95.709 kg)    BMI:  Body mass index is 32.6 kg/(m^2).  Estimated Nutritional Needs:  Kcal:  1600-1800  Protein:  70-85 grams  Fluid:  2-2.2 L/day  Skin:  WDL  Diet Order:  Diet NPO time specified Except for: Ice Chips  EDUCATION NEEDS: No education needs at this time   Intake/Output Summary (Last 24 hours) at 06/07/15 1030 Last data filed at 06/07/15 1022  Gross per 24 hour  Intake    550 ml  Output    890 ml  Net   -340 ml    Last BM:  6/13   Jarome Matin, RD, LDN Inpatient Clinical Dietitian Pager # [phone] After hours/weekend pager # [phone]

## 2015-06-07 NOTE — Interval H&P Note (Signed)
History and Physical Interval Note:  06/07/2015 9:20 AM  [name]  has presented today for surgery, with the diagnosis of cecal mass  The various methods of treatment have been discussed with the patient and family. After consideration of risks, benefits and other options for treatment, the patient has consented to  Procedure(s): COLONOSCOPY WITH PROPOFOL (N/A) as a surgical intervention .  The patient's history has been reviewed, patient examined, no change in status, stable for surgery.  I have reviewed the patient's chart and labs.  Questions were answered to the patient's satisfaction.     [name]

## 2015-06-08 ENCOUNTER — Encounter (HOSPITAL_COMMUNITY)
Admission: EM | Disposition: A | Discharge status: Home / Self Care | Payer: Self-pay | Source: Home / Self Care | Attending: Internal Medicine

## 2015-06-08 ENCOUNTER — Inpatient Hospital Stay (HOSPITAL_COMMUNITY): Payer: Medicare Other | Admitting: Certified Registered Nurse Anesthetist

## 2015-06-08 ENCOUNTER — Inpatient Hospital Stay (HOSPITAL_COMMUNITY): Payer: Medicare Other

## 2015-06-08 ENCOUNTER — Encounter (HOSPITAL_COMMUNITY): Payer: Self-pay | Admitting: Gastroenterology

## 2015-06-08 DIAGNOSIS — C189 Malignant neoplasm of colon, unspecified: Secondary | ICD-10-CM | POA: Diagnosis present

## 2015-06-08 HISTORY — PX: LAPAROSCOPIC PARTIAL RIGHT COLECTOMY: SHX5913

## 2015-06-08 LAB — CBC
HCT: 31.9 % — ABNORMAL LOW (ref 36.0–46.0)
Hemoglobin: 10.2 g/dL — ABNORMAL LOW (ref 12.0–15.0)
MCH: 27.8 pg (ref 26.0–34.0)
MCHC: 32 g/dL (ref 30.0–36.0)
MCV: 86.9 fL (ref 78.0–100.0)
PLATELETS: 343 10*3/uL (ref 150–400)
RBC: 3.67 MIL/uL — AB (ref 3.87–5.11)
RDW: 13.6 % (ref 11.5–15.5)
WBC: 9.1 10*3/uL (ref 4.0–10.5)

## 2015-06-08 LAB — BASIC METABOLIC PANEL
ANION GAP: 7 (ref 5–15)
BUN: <5 mg/dL — ABNORMAL LOW (ref 6–20)
CALCIUM: 8.9 mg/dL (ref 8.9–10.3)
CO2: 26 mmol/L (ref 22–32)
Chloride: 103 mmol/L (ref 101–111)
Creatinine, Ser: 0.48 mg/dL (ref 0.44–1.00)
GFR calc Af Amer: >60 mL/min (ref >60)
Glucose, Bld: 126 mg/dL — ABNORMAL HIGH (ref 65–99)
Potassium: 3.6 mmol/L (ref 3.5–5.1)
SODIUM: 136 mmol/L (ref 135–145)

## 2015-06-08 LAB — MAGNESIUM: Magnesium: 1.7 mg/dL (ref 1.7–2.4)

## 2015-06-08 LAB — SURGICAL PCR SCREEN
MRSA, PCR: NEGATIVE
Staphylococcus aureus: NEGATIVE

## 2015-06-08 SURGERY — LAPAROSCOPIC PARTIAL RIGHT COLECTOMY
Anesthesia: General

## 2015-06-08 MED ORDER — EPHEDRINE SULFATE 50 MG/ML IJ SOLN
INTRAMUSCULAR | Status: DC | PRN
  Administered 2015-06-08 (×4): 10 mg via INTRAVENOUS

## 2015-06-08 MED ORDER — HYDROMORPHONE HCL 2 MG/ML IJ SOLN
INTRAMUSCULAR | Status: AC
  Filled 2015-06-08: qty 1

## 2015-06-08 MED ORDER — MORPHINE SULFATE 2 MG/ML IJ SOLN
1.0000 mg | INTRAMUSCULAR | Status: DC | PRN
  Administered 2015-06-09 – 2015-06-12 (×4): 2 mg via INTRAVENOUS
  Filled 2015-06-08 (×5): qty 1

## 2015-06-08 MED ORDER — BUPIVACAINE HCL (PF) 0.5 % IJ SOLN
INTRAMUSCULAR | Status: AC
Start: 2015-06-08 — End: 2015-06-08
  Filled 2015-06-08: qty 30

## 2015-06-08 MED ORDER — MEPERIDINE HCL 50 MG/ML IJ SOLN: 6.2500 mg | INTRAMUSCULAR | Status: DC | PRN

## 2015-06-08 MED ORDER — LACTATED RINGERS IR SOLN
Status: DC | PRN
  Administered 2015-06-08: 1

## 2015-06-08 MED ORDER — ONDANSETRON HCL 4 MG/2ML IJ SOLN
INTRAMUSCULAR | Status: AC
  Filled 2015-06-08: qty 2

## 2015-06-08 MED ORDER — NEOSTIGMINE METHYLSULFATE 10 MG/10ML IV SOLN
INTRAVENOUS | Status: AC
  Filled 2015-06-08: qty 1

## 2015-06-08 MED ORDER — HEPARIN SODIUM (PORCINE) 5000 UNIT/ML IJ SOLN
5000.0000 [IU] | Freq: Three times a day (TID) | INTRAMUSCULAR | Status: DC
  Administered 2015-06-08 – 2015-06-14 (×16): 5000 [IU] via SUBCUTANEOUS
  Filled 2015-06-08 (×21): qty 1

## 2015-06-08 MED ORDER — LIDOCAINE HCL (CARDIAC) 20 MG/ML IV SOLN
INTRAVENOUS | Status: DC | PRN
  Administered 2015-06-08: 100 mg via INTRAVENOUS

## 2015-06-08 MED ORDER — LACTATED RINGERS IV SOLN
INTRAVENOUS | Status: DC
  Administered 2015-06-08: 13:00:00 via INTRAVENOUS
  Administered 2015-06-08: 1000 mL via INTRAVENOUS

## 2015-06-08 MED ORDER — ROCURONIUM BROMIDE 100 MG/10ML IV SOLN
INTRAVENOUS | Status: DC | PRN
  Administered 2015-06-08 (×2): 10 mg via INTRAVENOUS
  Administered 2015-06-08: 40 mg via INTRAVENOUS
  Administered 2015-06-08 (×2): 10 mg via INTRAVENOUS

## 2015-06-08 MED ORDER — ARTIFICIAL TEARS OP OINT
TOPICAL_OINTMENT | OPHTHALMIC | Status: AC
  Filled 2015-06-08: qty 3.5

## 2015-06-08 MED ORDER — SODIUM CHLORIDE 0.9 % IJ SOLN
INTRAMUSCULAR | Status: AC
  Filled 2015-06-08: qty 20

## 2015-06-08 MED ORDER — 0.9 % SODIUM CHLORIDE (POUR BTL) OPTIME
TOPICAL | Status: DC | PRN
  Administered 2015-06-08 (×4): 1000 mL

## 2015-06-08 MED ORDER — EPHEDRINE SULFATE 50 MG/ML IJ SOLN
INTRAMUSCULAR | Status: AC
  Filled 2015-06-08: qty 1

## 2015-06-08 MED ORDER — LIDOCAINE HCL (CARDIAC) 20 MG/ML IV SOLN
INTRAVENOUS | Status: AC
  Filled 2015-06-08: qty 5

## 2015-06-08 MED ORDER — PROPOFOL 10 MG/ML IV BOLUS
INTRAVENOUS | Status: AC
  Filled 2015-06-08: qty 20

## 2015-06-08 MED ORDER — ONDANSETRON HCL 4 MG/2ML IJ SOLN: 4.0000 mg | Freq: Four times a day (QID) | INTRAMUSCULAR | Status: DC | PRN

## 2015-06-08 MED ORDER — PROMETHAZINE HCL 25 MG/ML IJ SOLN: 6.2500 mg | INTRAMUSCULAR | Status: DC | PRN

## 2015-06-08 MED ORDER — BUPIVACAINE HCL (PF) 0.5 % IJ SOLN
INTRAMUSCULAR | Status: DC | PRN
  Administered 2015-06-08: 30 mL

## 2015-06-08 MED ORDER — GLYCOPYRROLATE 0.2 MG/ML IJ SOLN
INTRAMUSCULAR | Status: AC
  Filled 2015-06-08: qty 3

## 2015-06-08 MED ORDER — ONDANSETRON HCL 4 MG PO TABS: 4.0000 mg | ORAL_TABLET | Freq: Four times a day (QID) | ORAL | Status: DC | PRN

## 2015-06-08 MED ORDER — PROPOFOL 10 MG/ML IV BOLUS
INTRAVENOUS | Status: DC | PRN
  Administered 2015-06-08: 130 mg via INTRAVENOUS

## 2015-06-08 MED ORDER — HYDROMORPHONE HCL 1 MG/ML IJ SOLN
INTRAMUSCULAR | Status: DC | PRN
  Administered 2015-06-08 (×2): 0.5 mg via INTRAVENOUS

## 2015-06-08 MED ORDER — MORPHINE SULFATE 10 MG/ML IJ SOLN: 1.0000 mg | INTRAMUSCULAR | Status: DC | PRN

## 2015-06-08 MED ORDER — KCL IN DEXTROSE-NACL 20-5-0.45 MEQ/L-%-% IV SOLN
INTRAVENOUS | Status: DC
  Administered 2015-06-08 – 2015-06-09 (×2): via INTRAVENOUS
  Filled 2015-06-08 (×2): qty 1000

## 2015-06-08 MED ORDER — SODIUM CHLORIDE 0.9 % IJ SOLN
INTRAMUSCULAR | Status: AC
  Filled 2015-06-08: qty 10

## 2015-06-08 MED ORDER — HYDROCODONE-ACETAMINOPHEN 5-325 MG PO TABS: 1.0000 | ORAL_TABLET | ORAL | Status: DC | PRN

## 2015-06-08 MED ORDER — NEOSTIGMINE METHYLSULFATE 10 MG/10ML IV SOLN
INTRAVENOUS | Status: DC | PRN
  Administered 2015-06-08: 5 mg via INTRAVENOUS

## 2015-06-08 MED ORDER — DEXAMETHASONE SODIUM PHOSPHATE 10 MG/ML IJ SOLN
INTRAMUSCULAR | Status: DC | PRN
  Administered 2015-06-08: 10 mg via INTRAVENOUS

## 2015-06-08 MED ORDER — FENTANYL CITRATE (PF) 250 MCG/5ML IJ SOLN
INTRAMUSCULAR | Status: AC
  Filled 2015-06-08: qty 5

## 2015-06-08 MED ORDER — ONDANSETRON HCL 4 MG/2ML IJ SOLN
INTRAMUSCULAR | Status: DC | PRN
  Administered 2015-06-08: 4 mg via INTRAVENOUS

## 2015-06-08 MED ORDER — GLYCOPYRROLATE 0.2 MG/ML IJ SOLN
INTRAMUSCULAR | Status: AC
Start: 2015-06-08 — End: 2015-06-08
  Filled 2015-06-08: qty 3

## 2015-06-08 MED ORDER — DEXAMETHASONE SODIUM PHOSPHATE 10 MG/ML IJ SOLN
INTRAMUSCULAR | Status: AC
  Filled 2015-06-08: qty 1

## 2015-06-08 MED ORDER — SUCCINYLCHOLINE CHLORIDE 20 MG/ML IJ SOLN
INTRAMUSCULAR | Status: DC | PRN
  Administered 2015-06-08: 100 mg via INTRAVENOUS

## 2015-06-08 MED ORDER — GLYCOPYRROLATE 0.2 MG/ML IJ SOLN
INTRAMUSCULAR | Status: DC | PRN
  Administered 2015-06-08: 0.6 mg via INTRAVENOUS

## 2015-06-08 MED ORDER — FENTANYL CITRATE (PF) 100 MCG/2ML IJ SOLN
25.0000 ug | INTRAMUSCULAR | Status: DC | PRN
  Administered 2015-06-08: 25 ug via INTRAVENOUS

## 2015-06-08 MED ORDER — LACTATED RINGERS IV SOLN: INTRAVENOUS | Status: DC

## 2015-06-08 MED ORDER — FENTANYL CITRATE (PF) 100 MCG/2ML IJ SOLN
INTRAMUSCULAR | Status: DC | PRN
  Administered 2015-06-08 (×5): 50 ug via INTRAVENOUS

## 2015-06-08 MED ORDER — LIP MEDEX EX OINT
TOPICAL_OINTMENT | CUTANEOUS | Status: AC
Start: 2015-06-08 — End: 2015-06-08
  Administered 2015-06-08: 1
  Filled 2015-06-08: qty 7

## 2015-06-08 MED ORDER — LACTATED RINGERS IV SOLN
INTRAVENOUS | Status: DC | PRN
  Administered 2015-06-08 (×3): via INTRAVENOUS

## 2015-06-08 MED ORDER — ROCURONIUM BROMIDE 100 MG/10ML IV SOLN
INTRAVENOUS | Status: AC
  Filled 2015-06-08: qty 1

## 2015-06-08 MED ORDER — ESMOLOL HCL 10 MG/ML IV SOLN
INTRAVENOUS | Status: AC
  Filled 2015-06-08: qty 10

## 2015-06-08 MED ORDER — FENTANYL CITRATE (PF) 100 MCG/2ML IJ SOLN
INTRAMUSCULAR | Status: AC
  Filled 2015-06-08: qty 2

## 2015-06-08 MED ORDER — ALVIMOPAN 12 MG PO CAPS: 12.0000 mg | ORAL_CAPSULE | Freq: Two times a day (BID) | ORAL | Status: DC

## 2015-06-08 SURGICAL SUPPLY — 67 items
APPLIER CLIP 5 13 M/L LIGAMAX5 (MISCELLANEOUS)
APPLIER CLIP ROT 10 11.4 M/L (STAPLE)
BLADE EXTENDED COATED 6.5IN (ELECTRODE) ×3 IMPLANT
BLADE HEX COATED 2.75 (ELECTRODE) ×3 IMPLANT
CELLS DAT CNTRL 66122 CELL SVR (MISCELLANEOUS) IMPLANT
CLIP APPLIE 5 13 M/L LIGAMAX5 (MISCELLANEOUS) IMPLANT
CLIP APPLIE ROT 10 11.4 M/L (STAPLE) IMPLANT
CLOSURE WOUND 1/2 X4 (GAUZE/BANDAGES/DRESSINGS)
CUTTER FLEX LINEAR 45M (STAPLE) IMPLANT
DECANTER SPIKE VIAL GLASS SM (MISCELLANEOUS) ×3 IMPLANT
DRAPE LAPAROSCOPIC ABDOMINAL (DRAPES) ×3 IMPLANT
DRSG OPSITE POSTOP 4X6 (GAUZE/BANDAGES/DRESSINGS) ×3 IMPLANT
ELECT REM PT RETURN 9FT ADLT (ELECTROSURGICAL) ×3
ELECTRODE REM PT RTRN 9FT ADLT (ELECTROSURGICAL) ×1 IMPLANT
FILTER SMOKE EVAC LAPAROSHD (FILTER) IMPLANT
GAUZE SPONGE 4X4 12PLY STRL (GAUZE/BANDAGES/DRESSINGS) ×3 IMPLANT
GLOVE BIOGEL PI IND STRL 7.0 (GLOVE) ×1 IMPLANT
GLOVE BIOGEL PI INDICATOR 7.0 (GLOVE) ×2
GLOVE SURG SIGNA 7.5 PF LTX (GLOVE) ×6 IMPLANT
GOWN SPEC L4 XLG W/TWL (GOWN DISPOSABLE) ×3 IMPLANT
GOWN STRL REUS W/ TWL XL LVL3 (GOWN DISPOSABLE) ×3 IMPLANT
GOWN STRL REUS W/TWL LRG LVL3 (GOWN DISPOSABLE) ×3 IMPLANT
GOWN STRL REUS W/TWL XL LVL3 (GOWN DISPOSABLE) ×6
LEGGING LITHOTOMY PAIR STRL (DRAPES) IMPLANT
LIGASURE IMPACT 36 18CM CVD LR (INSTRUMENTS) ×3 IMPLANT
LIQUID BAND (GAUZE/BANDAGES/DRESSINGS) ×3 IMPLANT
NS IRRIG 1000ML POUR BTL (IV SOLUTION) ×3 IMPLANT
PACK COLON (CUSTOM PROCEDURE TRAY) ×3 IMPLANT
PORT LAP GEL ALEXIS MED 5-9CM (MISCELLANEOUS) ×3 IMPLANT
POUCH SPECIMEN RETRIEVAL 10MM (ENDOMECHANICALS) IMPLANT
RELOAD 45 VASCULAR/THIN (ENDOMECHANICALS) IMPLANT
RELOAD PROXIMATE 75MM BLUE (ENDOMECHANICALS) ×9 IMPLANT
RELOAD STAPLE TA45 3.5 REG BLU (ENDOMECHANICALS) IMPLANT
RELOAD STAPLER BLUE 60MM (STAPLE) IMPLANT
RTRCTR WOUND ALEXIS 18CM MED (MISCELLANEOUS)
SET IRRIG TUBING LAPAROSCOPIC (IRRIGATION / IRRIGATOR) ×3 IMPLANT
SHEARS HARMONIC ACE PLUS 36CM (ENDOMECHANICALS) ×3 IMPLANT
SOLUTION ANTI FOG 6CC (MISCELLANEOUS) ×3 IMPLANT
STAPLE ECHEON FLEX 60 POW ENDO (STAPLE) IMPLANT
STAPLER PROXIMATE 75MM BLUE (STAPLE) ×3 IMPLANT
STAPLER RELOAD BLUE 60MM (STAPLE)
STAPLER VISISTAT 35W (STAPLE) IMPLANT
STRIP CLOSURE SKIN 1/2X4 (GAUZE/BANDAGES/DRESSINGS) IMPLANT
SUT MNCRL AB 4-0 PS2 18 (SUTURE) ×6 IMPLANT
SUT NOVA NAB DX-16 0-1 5-0 T12 (SUTURE) IMPLANT
SUT PDS AB 1 CTX 36 (SUTURE) ×6 IMPLANT
SUT PROLENE 2 0 KS (SUTURE) IMPLANT
SUT PROLENE 2 0 SH DA (SUTURE) IMPLANT
SUT SILK 2 0 (SUTURE) ×2
SUT SILK 2 0 SH CR/8 (SUTURE) IMPLANT
SUT SILK 2-0 18XBRD TIE 12 (SUTURE) ×1 IMPLANT
SUT SILK 3 0 (SUTURE)
SUT SILK 3 0 SH CR/8 (SUTURE) ×6 IMPLANT
SUT SILK 3-0 18XBRD TIE 12 (SUTURE) IMPLANT
SUT VIC AB 2-0 SH 27 (SUTURE) ×2
SUT VIC AB 2-0 SH 27X BRD (SUTURE) ×1 IMPLANT
SUT VIC AB 3-0 SH 27 (SUTURE) ×2
SUT VIC AB 3-0 SH 27XBRD (SUTURE) ×1 IMPLANT
TRAY FOLEY W/METER SILVER 14FR (SET/KITS/TRAYS/PACK) ×3 IMPLANT
TROCAR ADV FIXATION 5X100MM (TROCAR) ×3 IMPLANT
TROCAR BLADELESS OPT 5 75 (ENDOMECHANICALS) ×3 IMPLANT
TROCAR XCEL 12X100 BLDLESS (ENDOMECHANICALS) IMPLANT
TROCAR XCEL BLUNT TIP 100MML (ENDOMECHANICALS) IMPLANT
TROCAR XCEL NON-BLD 11X100MML (ENDOMECHANICALS) IMPLANT
TROCAR XCEL UNIV SLVE 11M 100M (ENDOMECHANICALS) IMPLANT
TUBING FILTER THERMOFLATOR (ELECTROSURGICAL) ×3 IMPLANT
YANKAUER SUCT BULB TIP NO VENT (SUCTIONS) ×3 IMPLANT

## 2015-06-08 NOTE — Anesthesia Preprocedure Evaluation (Signed)
Anesthesia Evaluation  Patient identified by MRN, date of birth, ID band Patient awake    Reviewed: Allergy & Precautions, H&P , NPO status , Patient's Chart, lab work & pertinent test results  Airway Mallampati: III  TM Distance: <3 FB Neck ROM: full    Dental  (+) Poor Dentition, Dental Advisory Given   Pulmonary neg pulmonary ROS,  breath sounds clear to auscultation  Pulmonary exam normal       Cardiovascular Exercise Tolerance: Good negative cardio ROS Normal cardiovascular examRhythm:regular Rate:Normal     Neuro/Psych Seizures -,  MR negative psych ROS   GI/Hepatic negative GI ROS, Neg liver ROS,   Endo/Other  negative endocrine ROS  Renal/GU negative Renal ROS  negative genitourinary   Musculoskeletal   Abdominal   Peds  Hematology negative hematology ROS (+) anemia ,   Anesthesia Other Findings   Reproductive/Obstetrics negative OB ROS                             Anesthesia Physical  Anesthesia Plan  ASA: III  Anesthesia Plan: General   Post-op Pain Management:    Induction: Intravenous, Rapid sequence and Cricoid pressure planned  Airway Management Planned: Oral ETT  Additional Equipment:   Intra-op Plan:   Post-operative Plan: Extubation in OR  Informed Consent: I have reviewed the patients History and Physical, chart, labs and discussed the procedure including the risks, benefits and alternatives for the proposed anesthesia with the patient or authorized representative who has indicated his/her understanding and acceptance.   Dental Advisory Given  Plan Discussed with: CRNA and Surgeon  Anesthesia Plan Comments:         Anesthesia Quick Evaluation

## 2015-06-08 NOTE — Plan of Care (Signed)
Problem: Phase I Progression Outcomes Goal: Pain controlled with appropriate interventions Outcome: Progressing Will give pain meds as needed.

## 2015-06-08 NOTE — OR Nursing (Signed)
Radiology called Right IJ placement report, Dr Marcell Barlow advised of results. Dr Marcell Barlow came to PACU and pulled back 5cm as instructed by report, sutured and redressed.

## 2015-06-08 NOTE — Progress Notes (Signed)
OT Cancellation Note  Patient Details Name: [name] MRN: [id number] DOB: 08/10/1946   Cancelled Treatment:    Reason Eval/Treat Not Completed: Patient at procedure or test/ unavailable  [name] 06/08/2015, 11:01 AM  Lesle Chris, OTR/L [phone] 06/08/2015

## 2015-06-08 NOTE — Progress Notes (Signed)
Patient discussed in unit rounds today- plans for colectomy today- will likely benefit from a PT eval once cleared post op to further assess her dc/disposition needs. Patient admitted from group home where she has lived over 20 years.   Eduard Clos, MSW, [place]

## 2015-06-08 NOTE — Transfer of Care (Signed)
Immediate Anesthesia Transfer of Care Note  Patient: [name]  Procedure(s) Performed: Procedure(s): LAPAROSCOPIC PARTIAL COLECTOMY ;WITH RESECTION OF RIGHT TERMINAL ILIUM (N/A)  Patient Location: PACU  Anesthesia Type:General  Level of Consciousness:  sedated, patient cooperative and responds to stimulation  Airway & Oxygen Therapy:Patient Spontanous Breathing and Patient connected to face mask oxgen  Post-op Assessment:  Report given to PACU RN and Post -op Vital signs reviewed and stable  Post vital signs:  Reviewed and stable  Last Vitals:  Filed Vitals:   06/08/15 1023  BP: 119/67  Pulse: 101  Temp: 36.8 C  Resp: 20    Complications: No apparent anesthesia complications

## 2015-06-08 NOTE — Progress Notes (Addendum)
Patient ID: [name], female   DOB: 1946-05-30, 69 y.o.   MRN: [id number]  [facility] PROGRESS NOTE  [name] YYT:[id number] DOB: [birth year] DOA: 05/30/2015 PCP: Pcp Not In System   Brief narrative:    69 year old female with hyperlipidemia, osteoporosis, prior history of small bowel obstruction, constipation was brought to ED with main concern of several days duration of progressively worsening abd pain, per family member described as constant and throbbing, unknown severity, associated with poor oral intake, nausea and non bloody vomiting.   CT abdomen and pelvis showed recurrent high-grade mid to distal small bowel obstruction with transition point in the periumbilical region, infiltrating mass involving the right colon highly suspicious for colon cancer, at bedtime and prominent mesenteric lymph nodes and/or peritoneal nodules concerning for metastatic disease.   Major events since admission: 6/08 - NG tube placed 6/09 - slight improvement, electrolyte still depleted, needs more potassium 6/10 - no significant improvement in SBP per imaging studies but overall pt is stable  6/13 - tachy, slightly prolonged QT interval on tele, colonoscopy results below, NGT out  6/14 - plan to take to OR for lap right colectomy   Assessment/Plan:    High-grade SBO (small bowel obstruction): Also new diagnosis of colon mass - NG tube placed 06/02/2015 and out 6/13 - denies any further nausea, vomiting or abdominal pain, CEA elevated at 27.1 - Appreciating GI and surgery team assistance  - colonoscopy results below, worrisome for malignancy - pt will be taken to OR for lap chole 06/08/15  Active Problems:  Mental retardation - currently at her baseline mental status   HLD (hyperlipidemia) - Currently NPO    Tachycardia - slightly prolonged QT interval on tele - pt stable hemodynamically, denies chest pain or shortness of breath  - hold Zofran  - pt is NSR this AM    Hypokalemia - Patient receiving KCl with IV fluids - K is now WNL but < 4, will need to continue to supplement  - keep K ~4 and Mg ~2 - repeat BMP and Mg in a.m.     Accelerated HTN - placed on Hydralazine as needed and blood pressure has been stable so far    UTI - Urine culture showed only 35,000 colonies, currently on IV Rocephin, remains afebrile  - completed 5 days of Rocephin 06/03/2015    History of seizures - Unfortunately, no IV substitute of Felbamate, discussed with pharmacy and placed on IV Keppra for now     Severe protein calorie malnutrition in the context of acute illness  - appreciate nutritionist input    Obesity  - Body mass index is 31.65 kg/(m^2).  DVT prophylaxis - Heparin SQ  Code Status: Full.  Family Communication:  plan of care discussed with the patient, no family at bedside  Disposition Plan: Not ready for d/c as surgery is planned for today   IV access:  Peripheral IV  Procedures and diagnostic studies:    Dg Chest 2 View 05/30/2015   Cardiomegaly with vascular congestion and basilar atelectasis.  Marked gastric distention.     Dg Abd 1 View 06/02/2015  Enteric tube tip likely in the gastric fundus. 2. Moderate small bowel dilatation consistent with obstruction, improved   Ct Abdomen Pelvis W Contrast 05/30/2015  Recurrent high-grade mid to distal small bowel obstruction with transition point in the periumbilical region. 2. Strong concern of infiltrating mass involving the right colon, highly suspicious for colon cancer. There are adjacent prominent mesenteric lymph nodes  and/or peritoneal nodules concerning for metastatic disease. No distant metastases identified. This colon mass does not appear to be the etiology for the bowel obstruction. 3. Small amount of free pelvic fluid without generalized peritoneal nodularity. 4. Generally stable biliary dilatation status post cholecystectomy.   E  Dg Abd 2 Views 06/01/2015  High-grade partial small bowel  obstruction, without significant change compared to recent CT.     Dg Abd Acute W/chest 06/03/2015  Dilated small bowel loops show mild interval improvement from the prior study consistent with small bowel obstruction.  Dg Abd 2 Views 06/04/2015   Continued small bowel obstruction pattern, not significantly changed.    Dg Abd 2 Views 06/05/2015  Dilated small bowel loops consistent with obstruction, slightly improved from prior.   Dg Abd 2 Views 06/07/2015  Findings most compatible with improving small bowel obstruction.    Colonoscopy by Dr. 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 The mass was not obstructive. Multiple biopsies were taken. The examination was otherwise normal RECOMMENDATIONS: She will likely need repeat colonoscopy in a year (after surgical resection).  Medical Consultants:  GI Surgery   Other Consultants:  PT  IAnti-Infectives:   Rocephin 6/5 --> 6/9  Faye Ramsay, MD  [facility] Pager [phone]  If 7PM-7AM, please contact night-coverage [email or web address] Password [email or web address] 06/08/2015, 1:02 PM   LOS: 9 days   HPI/Subjective: No events overnight. Felling better and ready for surgery.   Objective: Filed Vitals:   06/07/15 1410 06/07/15 2054 06/08/15 0444 06/08/15 1023  BP: 126/79 100/66 94/63 119/67  Pulse: 125 121 115 101  Temp: 98.5 F (36.9 C) 98.6 F (37 C) 98.4 F (36.9 C) 98.2 F (36.8 C)  TempSrc: Oral Oral Oral Oral  Resp: 20 22 20 20   Height:      Weight:   83.689 kg (184 lb 8 oz)   SpO2: 98% 95% 97% 96%   No intake or output data in the 24 hours ending 06/08/15 1302  Exam:   General:  Pt is alert, follows commands appropriately, not in acute distress  Cardiovascular: Regular rhythm, tachy, S1/S2, no rubs, no gallops  Respiratory: Clear to auscultation bilaterally, no wheezing, diminished breath sounds  at bases   Abdomen: Soft, non tender, slightly distended, very faint BS, NGT in place   Data Reviewed: Basic Metabolic Panel:  Recent Labs Lab 06/04/15 0525 06/05/15 0518 06/06/15 0510 06/07/15 0445 06/08/15 0451  NA 138 137 140 138 136  K 3.7 4.0 4.3 4.2 3.6  CL 103 102 103 105 103  CO2 20* 21* 24 22 26   GLUCOSE 77 78 102* 103* 126*  BUN 6 <5* <5* <5* <5*  CREATININE 0.54 0.43* 0.54 0.48 0.48  CALCIUM 8.3* 8.5* 8.8* 8.6* 8.9  MG 1.9 1.8 1.8 1.8 1.7   CBC:  Recent Labs Lab 06/03/15 0540 06/04/15 0525 06/04/15 1236 06/06/15 0510 06/08/15 0451  WBC 7.6 6.8 7.5 7.2 9.1  HGB 10.1* 10.3* 10.4* 10.2* 10.2*  HCT 31.8* 32.9* 32.6* 31.5* 31.9*  MCV 86.2 87.5 87.4 86.8 86.9  PLT 329 384 381 348 343    Recent Results (from the past 240 hour(s))  Urine culture     Status: None   Collection Time: 05/30/15 11:35 AM  Result Value Ref Range Status   Specimen Description URINE, CLEAN CATCH  Final   Special Requests NONE  Final   Colony Count  Final    35,000 COLONIES/ML Performed at [facility]    Culture   Final    Multiple bacterial morphotypes present, none predominant. Suggest appropriate recollection if clinically indicated. Performed at [facility]    Report Status 05/31/2015 FINAL  Final  Urine culture     Status: None   Collection Time: 05/31/15  3:27 AM  Result Value Ref Range Status   Specimen Description URINE, RANDOM  Final   Special Requests NONE  Final   Colony Count NO GROWTH  Final   Report Status 06/01/2015 FINAL  Final  Clostridium Difficile by PCR (not at [facility])     Status: None   Collection Time: 06/01/15  5:05 PM  Result Value Ref Range Status   C difficile by pcr NEGATIVE NEGATIVE Final  Surgical pcr screen     Status: None   Collection Time: 06/08/15  8:24 AM  Result Value Ref Range Status   MRSA, PCR NEGATIVE NEGATIVE Final   Staphylococcus aureus NEGATIVE NEGATIVE Final   Scheduled Meds: . cefoTEtan (CEFOTAN) 2 GM IVPB   2 g Intravenous On Call to OR  . [MAR Hold] levETIRAcetam  1,000 mg Intravenous Q12H  . [MAR Hold] sodium chloride  3 mL Intravenous Q12H   Continuous Infusions: . 0.9 % NaCl with KCl 40 mEq / L 50 mL/hr (06/06/15 0312)  . lactated ringers    . lactated ringers 1,000 mL (06/08/15 1128)

## 2015-06-08 NOTE — Progress Notes (Signed)
Patient ID: [name], female   DOB: 1946-06-01, 69 y.o.   MRN: [id number] 1 Day Post-Op  Subjective: No complaints  Objective: Vital signs in last 24 hours: Temp:  [97.4 F (36.3 C)-98.6 F (37 C)] 98.4 F (36.9 C) (06/14 0444) Pulse Rate:  [115-125] 115 (06/14 0444) Resp:  [12-24] 20 (06/14 0444) BP: (94-167)/(63-89) 94/63 mmHg (06/14 0444) SpO2:  [95 %-98 %] 97 % (06/14 0444) Weight:  [83.689 kg (184 lb 8 oz)-86.183 kg (190 lb)] 83.689 kg (184 lb 8 oz) (06/14 0444) Last BM Date: 06/05/15  Intake/Output from previous day: 07-05-2023 0701 - 06/14 0700 In: 550 [I.V.:550] Out: -  Intake/Output this shift:    PE: Abd: soft, NT, Nd, +BS  Lab Results:   Recent Labs  06/06/15 0510 06/08/15 0451  WBC 7.2 9.1  HGB 10.2* 10.2*  HCT 31.5* 31.9*  PLT 348 343   BMET  Recent Labs  07/05/15 0445 06/08/15 0451  NA 138 136  K 4.2 3.6  CL 105 103  CO2 22 26  GLUCOSE 103* 126*  BUN <5* <5*  CREATININE 0.48 0.48  CALCIUM 8.6* 8.9   PT/INR No results for input(s): LABPROT, INR in the last 72 hours. CMP     Component Value Date/Time   NA 136 06/08/2015 0451   K 3.6 06/08/2015 0451   CL 103 06/08/2015 0451   CO2 26 06/08/2015 0451   GLUCOSE 126* 06/08/2015 0451   BUN <5* 06/08/2015 0451   CREATININE 0.48 06/08/2015 0451   CALCIUM 8.9 06/08/2015 0451   PROT 7.6 05/30/2015 1211   ALBUMIN 3.4* 05/30/2015 1211   AST 30 05/30/2015 1211   ALT 17 05/30/2015 1211   ALKPHOS 64 05/30/2015 1211   BILITOT <0.1* 05/30/2015 1211   GFRNONAA >60 06/08/2015 0451   GFRAA >60 06/08/2015 0451   Lipase     Component Value Date/Time   LIPASE 11* 05/30/2015 1211       Studies/Results: Dg Abd 2 Views  2015/07/05   CLINICAL DATA:  Patient status post colonoscopy.  EXAM: ABDOMEN - 2 VIEW  COMPARISON:  Abdominal radiograph 06/05/2015  FINDINGS: Slight interval improvement in gaseous distended loops of small bowel particularly within the left upper quadrant as can be seen with  obstruction, described on prior evaluations. Decubitus images demonstrate no definite evidence for free intraperitoneal air. Enteric tube tip and side-port project over the left upper quadrant. Cholecystectomy clips. Lumbar spine degenerative changes.  IMPRESSION: Findings most compatible with improving small bowel obstruction.   Electronically Signed   By: Lovey Newcomer M.D.   On: 07/05/2015 11:30    Anti-infectives: Anti-infectives    Start     Dose/Rate Route Frequency Ordered Stop   06/08/15 0600  cefoTEtan (CEFOTAN) 2 g in dextrose 5 % 50 mL IVPB     2 g 100 mL/hr over 30 Minutes Intravenous On call to O.R. 2015/07/05 1513 06/09/15 0559   07-05-2015 1600  metroNIDAZOLE (FLAGYL) tablet 1,000 mg     1,000 mg Oral 3 times per day on Mon 07/05/2015 1513 July 05, 2015 2335   05-Jul-2015 1600  neomycin (MYCIFRADIN) tablet 1,000 mg     1,000 mg Oral 3 times per day on Mon 2015-07-05 1513 Jul 05, 2015 2334   05/31/15 0800  cefTRIAXone (ROCEPHIN) 1 g in dextrose 5 % 50 mL IVPB - Premix  Status:  Discontinued     1 g 100 mL/hr over 30 Minutes Intravenous Every 24 hours 05/31/15 0742 06/04/15 1143  Assessment/Plan  1. SBO -resolved 2. Right colon mass -to OR today for lap assisted right colectomy 3.Tachycardia -patient had tachycardia on admission, but dissipated on its own. -began again 2 days ago, sustaining in 120-130s. EKG shows sinus tachy with prolonged QT. No other concerning findings noted. Patient without CP, SOB  -stable  LOS: 9 days    OSBORNE,KELLY E 06/08/2015, 7:49 AM Pager: [phone]  Agree with above.  I spoke to the sister, [name] ([phone]), who is herself old and will not make it to the hospital. Her son, [name], is coming and maybe another sister. I reviewed the operation with the sister.  Alphonsa Overall, MD, [facility] Surgery Pager: [phone] Office phone:  [phone]

## 2015-06-08 NOTE — Anesthesia Procedure Notes (Signed)
Procedure Name: Intubation Date/Time: 06/08/2015 12:47 PM Performed by: Maxwell Caul Pre-anesthesia Checklist: Patient identified, Emergency Drugs available, Suction available and Patient being monitored Patient Re-evaluated:Patient Re-evaluated prior to inductionOxygen Delivery Method: Circle System Utilized Preoxygenation: Pre-oxygenation with 100% oxygen Intubation Type: IV induction, Cricoid Pressure applied and Rapid sequence Laryngoscope Size: Glidescope and 3 Grade View: Grade I Tube type: Oral Tube size: 7.5 mm Number of attempts: 1 Airway Equipment and Method: Stylet and Oral airway Placement Confirmation: ETT inserted through vocal cords under direct vision,  positive ETCO2 and breath sounds checked- equal and bilateral Secured at: 21 cm Tube secured with: Tape Dental Injury: Teeth and Oropharynx as per pre-operative assessment  Difficulty Due To: Difficulty was anticipated, Difficult Airway- due to limited oral opening and Difficult Airway- due to reduced neck mobility Future Recommendations: Recommend- induction with short-acting agent, and alternative techniques readily available Comments: Elective Glidescope intubation with #3. Grade 1 view and placement of ETT with ease.

## 2015-06-08 NOTE — Anesthesia Postprocedure Evaluation (Signed)
  Anesthesia Post-op Note  Patient: [name]  Procedure(s) Performed: Procedure(s) (LRB): LAPAROSCOPIC PARTIAL COLECTOMY ;WITH RESECTION OF RIGHT TERMINAL ILIUM (N/A)  Patient Location: PACU  Anesthesia Type: General  Level of Consciousness: awake and alert   Airway and Oxygen Therapy: Patient Spontanous Breathing  Post-op Pain: mild  Post-op Assessment: Post-op Vital signs reviewed, Patient's Cardiovascular Status Stable, Respiratory Function Stable, Patent Airway and No signs of Nausea or vomiting  Last Vitals:  Filed Vitals:   06/08/15 1704  BP: 128/69  Pulse: 106  Temp: 36.4 C  Resp: 14    Post-op Vital Signs: stable   Complications: No apparent anesthesia complications

## 2015-06-09 ENCOUNTER — Encounter (HOSPITAL_COMMUNITY): Payer: Self-pay | Admitting: Surgery

## 2015-06-09 DIAGNOSIS — I10 Essential (primary) hypertension: Secondary | ICD-10-CM | POA: Diagnosis present

## 2015-06-09 DIAGNOSIS — R569 Unspecified convulsions: Secondary | ICD-10-CM

## 2015-06-09 LAB — BASIC METABOLIC PANEL
ANION GAP: 9 (ref 5–15)
BUN: <5 mg/dL — ABNORMAL LOW (ref 6–20)
CO2: 24 mmol/L (ref 22–32)
Calcium: 8.4 mg/dL — ABNORMAL LOW (ref 8.9–10.3)
Chloride: 104 mmol/L (ref 101–111)
Creatinine, Ser: 0.42 mg/dL — ABNORMAL LOW (ref 0.44–1.00)
GFR calc Af Amer: >60 mL/min (ref >60)
GFR calc non Af Amer: >60 mL/min (ref >60)
Glucose, Bld: 167 mg/dL — ABNORMAL HIGH (ref 65–99)
POTASSIUM: 3.8 mmol/L (ref 3.5–5.1)
SODIUM: 137 mmol/L (ref 135–145)

## 2015-06-09 LAB — CBC
HCT: 28.6 % — ABNORMAL LOW (ref 36.0–46.0)
HEMOGLOBIN: 9.2 g/dL — AB (ref 12.0–15.0)
MCH: 27.5 pg (ref 26.0–34.0)
MCHC: 32.2 g/dL (ref 30.0–36.0)
MCV: 85.4 fL (ref 78.0–100.0)
PLATELETS: 307 10*3/uL (ref 150–400)
RBC: 3.35 MIL/uL — AB (ref 3.87–5.11)
RDW: 13.4 % (ref 11.5–15.5)
WBC: 11.5 10*3/uL — ABNORMAL HIGH (ref 4.0–10.5)

## 2015-06-09 LAB — MAGNESIUM: MAGNESIUM: 1.5 mg/dL — AB (ref 1.7–2.4)

## 2015-06-09 MED ORDER — MAGNESIUM SULFATE 4 GM/100ML IV SOLN
4.0000 g | Freq: Once | INTRAVENOUS | Status: AC
  Administered 2015-06-09: 4 g via INTRAVENOUS
  Filled 2015-06-09: qty 100

## 2015-06-09 MED ORDER — METOPROLOL TARTRATE 1 MG/ML IV SOLN
2.5000 mg | Freq: Three times a day (TID) | INTRAVENOUS | Status: DC
  Administered 2015-06-09 – 2015-06-11 (×7): 2.5 mg via INTRAVENOUS
  Filled 2015-06-09 (×11): qty 5

## 2015-06-09 MED ORDER — POTASSIUM CHLORIDE 2 MEQ/ML IV SOLN
INTRAVENOUS | Status: DC
  Administered 2015-06-09 – 2015-06-11 (×3): via INTRAVENOUS
  Filled 2015-06-09 (×5): qty 1000

## 2015-06-09 NOTE — Progress Notes (Signed)
Date:  June 09, 2015 U.R. performed for needs and level of care. Will continue to follow for Case Management needs.  Velva Harman, RN, BSN, [place]   [phone]

## 2015-06-09 NOTE — Progress Notes (Signed)
1 Day Post-Op  Subjective: [name] is up in the chair and visiting with a friend from work.  [name] denies any pain, can't contribute much more than that.  Seems quite happy.  Objective: Vital signs in last 24 hours: Temp:  [97.4 F (36.3 C)-98.8 F (37.1 C)] 97.4 F (36.3 C) (06/15 0745) Pulse Rate:  [66-116] 94 (06/15 0812) Resp:  [10-24] 16 (06/15 0812) BP: (119-172)/(67-89) 160/74 mmHg (06/15 0812) SpO2:  [91 %-99 %] 96 % (06/15 0812) Weight:  [90.7 kg (199 lb 15.3 oz)] 90.7 kg (199 lb 15.3 oz) (06/15 0400) Last BM Date: 06/08/15 4 stool recorded last PM Afebrile, VSS WBC up some BMP OK Intake/Output from previous day: 06/14 0701 - 06/15 0700 In: 5250 [I.V.:5200; IV Piggyback:50] Out: 2050 [Urine:1950; Blood:100] Intake/Output this shift:    General appearance: alert, cooperative and no distress Resp: clear to auscultation bilaterally GI: soft, [name] does not complain of pain, no BS.  Lab Results:   Recent Labs  06/08/15 0451 06/09/15 0600  WBC 9.1 11.5*  HGB 10.2* 9.2*  HCT 31.9* 28.6*  PLT 343 307    BMET  Recent Labs  06/08/15 0451 06/09/15 0600  NA 136 137  K 3.6 3.8  CL 103 104  CO2 26 24  GLUCOSE 126* 167*  BUN <5* <5*  CREATININE 0.48 0.42*  CALCIUM 8.9 8.4*   PT/INR No results for input(s): LABPROT, INR in the last 72 hours.  No results for input(s): AST, ALT, ALKPHOS, BILITOT, PROT, ALBUMIN in the last 168 hours.   Lipase     Component Value Date/Time   LIPASE 11* 05/30/2015 1211     Studies/Results: Dg Chest Port 1 View  06/08/2015   CLINICAL DATA:  Central line placement.  EXAM: PORTABLE CHEST - 1 VIEW  COMPARISON:  Single view of the chest 06/03/2015.  FINDINGS: A new right IJ catheter is in place with the tip projecting the right atrium. Recommend withdrawal of approximately 5-6 cm. There is no pneumothorax. Cardiomegaly is noted. Mild left basilar atelectasis is seen.  IMPRESSION: Tip of right IJ catheter projects in the right atrium.  Recommend withdrawal of 5-6 cm. Negative for pneumothorax. Findings called to the patient's nurse, Santiago Glad, at the time of interpretation.   Electronically Signed   By: Inge Rise M.D.   On: 06/08/2015 16:38   Dg Abd 2 Views  06/07/2015   CLINICAL DATA:  Patient status post colonoscopy.  EXAM: ABDOMEN - 2 VIEW  COMPARISON:  Abdominal radiograph 06/05/2015  FINDINGS: Slight interval improvement in gaseous distended loops of small bowel particularly within the left upper quadrant as can be seen with obstruction, described on prior evaluations. Decubitus images demonstrate no definite evidence for free intraperitoneal air. Enteric tube tip and side-port project over the left upper quadrant. Cholecystectomy clips. Lumbar spine degenerative changes.  IMPRESSION: Findings most compatible with improving small bowel obstruction.   Electronically Signed   By: Lovey Newcomer M.D.   On: 06/07/2015 11:30    Medications: . heparin subcutaneous  5,000 Units Subcutaneous 3 times per day  . levETIRAcetam  1,000 mg Intravenous Q12H  . sodium chloride  3 mL Intravenous Q12H   . dextrose 5 % and 0.45% NaCl 1,000 mL with potassium chloride 40 mEq infusion 75 mL/hr at 06/09/15 0818   Assessment/Plan Right colon cancer with nodules lateral to right colon and small scarred terminal ileum under umbilicus -   Laparoscopic-assisted right partial colectomy with resection of terminal ileum,06/08/15, Dr. Lucia Gaskins  SBO  resolved Mental retardation,  Hx of seizure UTI Dyslipidemia Antibiotics:  Completed 5 days of Ceftriaxone on 06/04/15/flagy/neomycin x 1 day 6/13, cefotetan 2 gm on call to OR 06/08/15 DVT:  Heparin/SCD   Plan:  Ice chips today, [name] can go to the floor if OK with Dr. Grandville Silos. Continue IV fluids and work on mobilizing her some.  Already up in chair this AM.     LOS: 10 days    JENNINGS,WILLARD 06/09/2015  Agree with above. Looks good. Will not do the IS.    Alphonsa Overall, MD, [facility] Surgery Pager: [phone] Office phone:  [phone]

## 2015-06-09 NOTE — Op Note (Signed)
[name], [name]                  ACCOUNT NO.:  [id number]  MEDICAL RECORD NO.:  [id number]  LOCATION:                                 FACILITY:  PHYSICIAN:  Fenton Malling. Lucia Gaskins, M.D.  DATE OF BIRTH:  1946/10/25  DATE OF PROCEDURE:  06/08/2015                              OPERATIVE REPORT   PREOPERATIVE DIAGNOSIS:  Right colon cancer.  POSTOPERATIVE DIAGNOSIS:  Right colon cancer with nodules lateral to right colon and small scarred terminal ileum under umbilicus.  PROCEDURE:  Laparoscopic-assisted right partial colectomy with resection of terminal ileum.  SURGEON:  Fenton Malling. Lucia Gaskins, M.D.  FIRST ASSISTANT:  Henreitta Cea, PA  ANESTHESIA:  General endotracheal, supervised Dr. Montez Hageman.  ESTIMATED BLOOD LOSS:  150 mL.  DRAINS:  Drains left in were none.  Local anesthetic was 30 mL with 0.5% Marcaine.  SPECIMEN:  A right colon in the last 2 feet of her small bowel.  INDICATIONS FOR PROCEDURE:  [name] is a 69 year old white female, who is mentally retarded.  She lives in a group home.  She presented with a small-bowel obstruction (her SBO is recurrent), and during this evaluation or by CT, was found to have a right colon mass.  She now comes for a laparoscopic-assisted right hemicolectomy.    Her power-of-attorney is [name], who is her sister, who is unable medically to come to the hospital.  The indications and potential complications of surgery were explained to the patient and her sister.  I am not sure how much the patient understands her condition or operation.  Potential complications include, but not limited to, bleeding, infection, bowel leak or injury, nerve injury, and recurrence of her cancer.  OPERATIVE NOTE:  The patient was placed in supine position in room #6.  Both her arms were tucked.  She had a Foley catheter in place.  Her abdomen was prepped with ChloraPrep and sterilely draped.    A time-out was held and surgical checklist run.  I accessed her  abdominal cavity through a 5-mm trocar in the left upper quadrant.  I placed a total of 4 trocars, a 5-mm in the left lower quadrant, a 5-mm medially above the umbilicus, which I later used for the midline incision, and a 5-mm in the right lower quadrant.  Abdominal exploration of the anterior wall of the stomach was unremarkable.  The left lobe of the liver was unremarkable.  The right lobe was stuck up to the anterior abdominal wall.  This was from a prior cholecystectomy and I was unable to evaluate the liver otherwise, but from what I could see at all looked normal.  Her small bowel had one knuckle of small bowel, stuck up on the anterior peritoneal surface, just below the umbilicus, which was densely adhered.  There was some evidence of proximal dilatation.  This I think is consistent with where this lady has had these prior small bowel obstructions.  The rest of her small bowel was remarkably free of adhesions.  In her pelvis, there was no mass or nodule noted.  In her right colon, she had 6-7 cm mass, which was going through the wall of  the colon.  There was 1.5 cm nodule lateral to the right colon.  I mobilized the right colon off the retroperitoneum. I started dissection at the anterior pelvic brim and reflected the ileum cephalad.  I was anterior to the right ureter, I identified the duodenum, and swept the right colon anterior to this.  I freed up the transverse colon.  She had adhesions to the anterior abdominal wall both in the right upper quadrant secondary to gallbladder surgery and in her lower midline what appears to be from a prior hysterectomy.  I then made a midline incision about 7 cm in length and got into the abdominal cavity.  I put a wound protector in the incision.  I then brought out this tumor and right colon, which was growth of her mid right colon.  I also pulled up the small bowel about 2-2 0.5 feet from the terminal ileum.  This included the loop of bowel that was stuck  to the anterior abdominal wall.  I think it caused the prior obstructions were.  I divided this with a 75 GIA stapler.  I went to the right transverse colon.  I divided that with a 75 GIA stapler.  I then tried to take the right colonic vessels down to their origin in the retroperitoneum.  I used a LigaSure to divide the mesentery and with these vessels, I would ligate it with a 2-0 silk suture and a 2-0 silk ties.  I removed the entire specimen, which I included both the primary tumor and the nodule, which was lateral to the colon, all included en-bloc resection.  This was sent to Pathology.  First, I was able to close the mesentery with a running 2-0 Vicryl suture.  I then did a side-to-side stapled colonic to ileal anastomosis with a 75 mm GIA stapler.  I inspected the staple line.  There was no bleeding from the staple line.  I then closed the common channel enterotomy with interrupted 3-0 silk sutures.  This had about a 4-5 cm colonic to jejunal anastomosis.  There was no bleeding and this appeared to be an air-tight closure.  I then dropped this back in the abdominal cavity.  I irrigated the abdomen with 3 L of saline until the saline was clear.  I then closed the midline incision with 2 running #1 PDS sutures, trying to both the cord notch and medial notch also.  At this point, I re- laparoscoped the patient.  There was no evidence of any adhesions or things trapped in the midline closure.  There was no evidence of any bleeding and what I could see the anastomosis looked good.  We changed entirely our closure set.  I irrigated the wound with saline, closed with interrupted 3-0 Vicryl sutures.  I closed the skin in the 3 remaining trocar sites with a 4-0 Monocryl suture and painted the wounds with Dermabond.  The patient tolerated the procedure well.  I estimated her blood loss was about 125-150.  Postop, I will put her in step-down ICU primarily because I think she has a little bit understanding  of her surgery and ability to ask for help and the ICU nurses can watch her over a little more closely.   Fenton Malling. Lucia Gaskins, M.D., [facility], scribe for Epic   DHN/MEDQ  D:  06/08/2015  T:  06/09/2015  Job:  [id number]  cc:   Mart Piggs, MD  [name], MD

## 2015-06-09 NOTE — Progress Notes (Signed)
Pt trans from ICU today. CSW met with pt/sister ( Matti Sue ). Pt / family are hopeful that pt can return to group home at D/C. PT / OT reports returning to group home will depend on pt's progress. Pt was independent at group home prior to hospitalization. CSW has left a message for rep at group home to call CSW for assistance with d/c planning. It is unclear, at this point, if pt can return to group home if assistance with ADLs is needed.  Jamie Haidinger LCSW [phone] 

## 2015-06-09 NOTE — Progress Notes (Signed)
[facility] PROGRESS NOTE  [name] GYJ:[id number] DOB: June 01, 1946 DOA: 05/30/2015 PCP: Pcp Not In System  Brief narrative 69 year old female with hyperlipidemia, osteoporosis, prior history of small bowel obstruction, constipation was brought to ED with main concern of several days duration of progressively worsening abd pain, per family member described as constant and throbbing, unknown severity, associated with poor oral intake, nausea and non bloody vomiting.   CT abdomen and pelvis showed recurrent high-grade mid to distal small bowel obstruction with transition point in the periumbilical region, infiltrating mass involving the right colon highly suspicious for colon cancer, at bedtime and prominent mesenteric lymph nodes and/or peritoneal nodules concerning for metastatic disease.   Major events since admission: 6/08 - NG tube placed 6/09 - slight improvement, electrolyte still depleted, needs more potassium 6/10 - no significant improvement in SBP per imaging studies but overall pt is stable  6/13 - tachy, slightly prolonged QT interval on tele, colonoscopy results below, NGT out  6/14 - Laparoscopic-assisted right partial colectomy with resection of terminal ileum. Per Dr. Lucia Gaskins   Assessment/Plan: #1 high-grade small bowel obstruction Status post NG tube placed 06/02/2015 and removed 06/07/2015. Patient with no further nausea vomiting or abdominal pain. Patient passing gas. Replete electrolytes. Patient has been seen by general surgery and patient has been started on ice chips. Follow.  #2 colonic mass Per CT scan. Patient status post laparoscopic assisted right partial colectomy with resection of terminal ileum 06/08/2015 per Dr. Lucia Gaskins. Patient has been started on ice chips per general surgery. Continue supportive care. Mobilization.  #3 mental retardation At baseline.  #4 hyperlipidemia Medications on hold for now.  #5 tachycardia  Improved. Follow.  #6  leukocytosis Likely reactive leukocytosis postoperatively. Patient with no respiratory symptoms. Will follow for now.  #7 hypertension Place on IV Lopressor. Hydralazine as needed.  #8 bacteriuria Urine culture showed only 35,000 colonies. Patient completed 5 days of Rocephin.  #9 history of seizures Stable. Patient with no seizures. Continue IV Keppra. When tolerating oral intake will resume home regimen.  #10 severe protein calorie malnutrition Once tolerating oral intake could start nutritional supplementation.  #11 obesity  #12 hypokalemia/hypomagnesemia Replete.  #13 prophylaxis Heparin for DVT prophylaxis.   Code Status: Full Family Communication: Updated patient. No family at bedside. Disposition Plan: Transfer to [place].   Consultants:  Gastroenterology: Dr. Fuller Plan 05/31/2015  Gen. surgery: Dr. Harlow Asa 05/31/2015  Procedures:  Chest x-ray 06/08/2015  Abdominal films 06/07/2015, 06/05/2015, 06/04/2015, 06/03/2015  Right IJ catheter 06/08/2015  CT abdomen and pelvis 05/30/2015  Laparoscopic assisted right partial colectomy with resection of terminal ileum 06/08/2015 Dr. Lucia Gaskins  Colonoscopy 06/07/2015 per Dr. Ardis Hughs There was a non-circumferential, clearly malignant mass in the ascending colon (just distal to the IC valve), measured about 6-7 cm long. The mass was not obstructive. Multiple biopsies were taken. The examination was otherwise normal  Antibiotics:  IV Rocephin 05/31/2015>>>> 06/04/2015  IV Cefotan 06/08/2015 on call to or>>>> 06/08/2015  Oral Flagyl 06/07/2015>>>> 06/07/2015  HPI/Subjective: Patient with no nausea or emesis. Patient denies any abdominal pain. Patient sitting up in chair. Patient passing gas. Patient with no bowel movement per nursing.  Objective: Filed Vitals:   06/09/15 0812  BP: 160/74  Pulse: 94  Temp:   Resp: 16    Intake/Output Summary (Last 24 hours) at 06/09/15 0908 Last data filed at 06/09/15 0700   Gross per 24 hour  Intake   5250 ml  Output   2050 ml  Net   3200 ml  Filed Weights   06/07/15 0847 06/08/15 0444 06/09/15 0400  Weight: 86.183 kg (190 lb) 83.689 kg (184 lb 8 oz) 90.7 kg (199 lb 15.3 oz)    Exam:   General:  NAD  Cardiovascular: RRR  Respiratory: CTAB  Abdomen: Soft/NT/ND/hypoactive BS.   Musculoskeletal: No clubbing cyanosis or edema.  Data Reviewed: Basic Metabolic Panel:  Recent Labs Lab 06/05/15 0518 06/06/15 0510 06/07/15 0445 06/08/15 0451 06/09/15 0600  NA 137 140 138 136 137  K 4.0 4.3 4.2 3.6 3.8  CL 102 103 105 103 104  CO2 21* 24 22 26 24   GLUCOSE 78 102* 103* 126* 167*  BUN <5* <5* <5* <5* <5*  CREATININE 0.43* 0.54 0.48 0.48 0.42*  CALCIUM 8.5* 8.8* 8.6* 8.9 8.4*  MG 1.8 1.8 1.8 1.7 1.5*   Liver Function Tests: No results for input(s): AST, ALT, ALKPHOS, BILITOT, PROT, ALBUMIN in the last 168 hours. No results for input(s): LIPASE, AMYLASE in the last 168 hours. No results for input(s): AMMONIA in the last 168 hours. CBC:  Recent Labs Lab 06/04/15 0525 06/04/15 1236 06/06/15 0510 06/08/15 0451 06/09/15 0600  WBC 6.8 7.5 7.2 9.1 11.5*  HGB 10.3* 10.4* 10.2* 10.2* 9.2*  HCT 32.9* 32.6* 31.5* 31.9* 28.6*  MCV 87.5 87.4 86.8 86.9 85.4  PLT 384 381 348 343 307   Cardiac Enzymes: No results for input(s): CKTOTAL, CKMB, CKMBINDEX, TROPONINI in the last 168 hours. BNP (last 3 results) No results for input(s): BNP in the last 8760 hours.  ProBNP (last 3 results) No results for input(s): PROBNP in the last 8760 hours.  CBG: No results for input(s): GLUCAP in the last 168 hours.  Recent Results (from the past 240 hour(s))  Urine culture     Status: None   Collection Time: 05/30/15 11:35 AM  Result Value Ref Range Status   Specimen Description URINE, CLEAN CATCH  Final   Special Requests NONE  Final   Colony Count   Final    35,000 COLONIES/ML Performed at [facility]    Culture   Final    Multiple  bacterial morphotypes present, none predominant. Suggest appropriate recollection if clinically indicated. Performed at [facility]    Report Status 05/31/2015 FINAL  Final  Urine culture     Status: None   Collection Time: 05/31/15  3:27 AM  Result Value Ref Range Status   Specimen Description URINE, RANDOM  Final   Special Requests NONE  Final   Colony Count NO GROWTH Performed at [facility]   Final   Culture NO GROWTH Performed at [facility]   Final   Report Status 06/01/2015 FINAL  Final  Clostridium Difficile by PCR (not at [facility])     Status: None   Collection Time: 06/01/15  5:05 PM  Result Value Ref Range Status   C difficile by pcr NEGATIVE NEGATIVE Final  Surgical pcr screen     Status: None   Collection Time: 06/08/15  8:24 AM  Result Value Ref Range Status   MRSA, PCR NEGATIVE NEGATIVE Final   Staphylococcus aureus NEGATIVE NEGATIVE Final    Comment:        The Xpert SA Assay (FDA approved for NASAL specimens in patients over 69 years of age), is one component of a comprehensive surveillance program.  Test performance has been validated by [facility] for patients greater than or equal to 49 year old. It is not intended to diagnose infection nor to guide or  monitor treatment.      Studies: Dg Chest Port 1 View  06/08/2015   CLINICAL DATA:  Central line placement.  EXAM: PORTABLE CHEST - 1 VIEW  COMPARISON:  Single view of the chest 06/03/2015.  FINDINGS: A new right IJ catheter is in place with the tip projecting the right atrium. Recommend withdrawal of approximately 5-6 cm. There is no pneumothorax. Cardiomegaly is noted. Mild left basilar atelectasis is seen.  IMPRESSION: Tip of right IJ catheter projects in the right atrium. Recommend withdrawal of 5-6 cm. Negative for pneumothorax. Findings called to the patient's nurse, Santiago Glad, at the time of interpretation.   Electronically Signed   By: Inge Rise M.D.   On: 06/08/2015 16:38    Dg Abd 2 Views  06/07/2015   CLINICAL DATA:  Patient status post colonoscopy.  EXAM: ABDOMEN - 2 VIEW  COMPARISON:  Abdominal radiograph 06/05/2015  FINDINGS: Slight interval improvement in gaseous distended loops of small bowel particularly within the left upper quadrant as can be seen with obstruction, described on prior evaluations. Decubitus images demonstrate no definite evidence for free intraperitoneal air. Enteric tube tip and side-port project over the left upper quadrant. Cholecystectomy clips. Lumbar spine degenerative changes.  IMPRESSION: Findings most compatible with improving small bowel obstruction.   Electronically Signed   By: Lovey Newcomer M.D.   On: 06/07/2015 11:30    Scheduled Meds: . heparin subcutaneous  5,000 Units Subcutaneous 3 times per day  . levETIRAcetam  1,000 mg Intravenous Q12H  . sodium chloride  3 mL Intravenous Q12H   Continuous Infusions: . dextrose 5 % and 0.45% NaCl 1,000 mL with potassium chloride 40 mEq infusion 75 mL/hr at 06/09/15 0818    Principal Problem:   SBO (small bowel obstruction) Active Problems:   Mental retardation   Convulsions   HLD (hyperlipidemia)   Colonic mass   Small bowel obstruction   UTI (lower urinary tract infection)   Protein-calorie malnutrition, severe   Colon cancer    Time spent: [place] MD [facility] Pager [phone]. If 7PM-7AM, please contact night-coverage at [email or web address], password [facility] 06/09/2015, 9:08 AM  LOS: 10 days

## 2015-06-09 NOTE — Progress Notes (Signed)
[place] for assistance with IV Keppra Dosing Indication: Hx seizure disorders; currently NPO  No Known Allergies  Patient Measurements: Height: 5\' 4"  (162.6 cm) (per chart) Weight: 199 lb 15.3 oz (90.7 kg) IBW/kg (Calculated) : 54.7   Vital Signs: Temp: 98.2 F (36.8 C) (06/15 1200) Temp Source: Oral (06/15 1200) BP: 120/67 mmHg (06/15 1200) Pulse Rate: 110 (06/15 1200) Intake/Output from previous day: 06/14 0701 - 06/15 0700 In: 5250 [I.V.:5200; IV Piggyback:50] Out: 2050 [Urine:1950; Blood:100] Intake/Output from this shift:    Labs:  Recent Labs  06/07/15 0445 06/08/15 0451 06/09/15 0600  WBC  --  9.1 11.5*  HGB  --  10.2* 9.2*  HCT  --  31.9* 28.6*  PLT  --  343 307  CREATININE 0.48 0.48 0.42*  MG 1.8 1.7 1.5*   Estimated Creatinine Clearance: 71.4 mL/min (by C-G formula based on Cr of 0.42).     Medical History: Past Medical History  Diagnosis Date  . Hypercholesteremia   . Constipation   . Osteoporosis   . Seizure   . Edema   . Mental retardation   . SBO (small bowel obstruction) 11/2013, 05/2015    likely due to adhesions  . Colonic mass 05/2015    right, concerning for cancer    Medications:  Scheduled Meds: . heparin subcutaneous  5,000 Units Subcutaneous 3 times per day  . levETIRAcetam  1,000 mg Intravenous Q12H  . metoprolol  2.5 mg Intravenous 3 times per day  . sodium chloride  3 mL Intravenous Q12H   Continuous Infusions: . dextrose 5 % and 0.45% NaCl 1,000 mL with potassium chloride 40 mEq infusion 75 mL/hr at 06/09/15 0818   PRN Meds:.acetaminophen **OR** acetaminophen, hydrALAZINE, HYDROcodone-acetaminophen, HYDROmorphone (DILAUDID) injection, morphine injection, ondansetron **OR** ondansetron (ZOFRAN) IV  Assessment: 69 y/o F with PMH that includes seizure disorder and mental retardation, on felbamate prior to admission, now with SBO and unable to tolerate oral medications.   Unfortunately there is no IV form of felbamate available.  Discussed with attending MD 6/6 - decision was made to use IV Keppra for now and pharmacy assistance with dosing was requested.   Today, 06/09/2015:  R colon mass -- s/p lap assisted R colectomy with resection of right terminal ilium on 6/14  Advancing to ice chips today  Remains on Keppra 1 gram IV q12h  No seizures or adverse effects from [place] reported in latest notes.   Goal of Therapy:  Seizure prevention  Plan:   Continue Keppra at 1000 mg IV q12h.  Follow clinical course.  When patient can safely tolerate oral or enteral medications, resume felbamate PO or via tube at PTA dosage (1200 mg BID) and stop Keppra.  Dia Sitter, PharmD, BCPS 06/09/2015 12:45 PM

## 2015-06-09 NOTE — Progress Notes (Signed)
Occupational Therapy Treatment Patient Details Name: [name] MRN: [id number] DOB: Mar 07, 1946 Today's Date: 06/09/2015    History of present illness 69 year old female with recurrent small bowel obstruction probably secondary to adhesions. Pt with MR and was I at group home.  s/p lap R partial colectomy and resection   OT comments  Pt now seen after sx; she needs min A for ADLs, UB due to lines.  Will need this support at [place]  Follow Up Recommendations  Supervision - Intermittent (and min A for ADLs)  Pt is from group home.  Likely will not need follow up OT if group home can assist.  SNF, if they cannot provide this assistance.   Equipment Recommendations  None recommended by OT    Recommendations for Other Services      Precautions / Restrictions Precautions Precautions: Fall Restrictions Weight Bearing Restrictions: No       Mobility Bed Mobility               General bed mobility comments: OOB  Transfers   Equipment used: None Transfers: Sit to/from Stand Sit to Stand: Supervision         General transfer comment: watching lines    Balance                                   ADL Overall ADL's : Needs assistance/impaired     Grooming: Wash/dry hands;Wash/dry face;Min guard;Sitting (to protect IV site)   Upper Body Bathing: Min guard;Sitting (to protect dressings)   Lower Body Bathing: Minimal assistance;Sit to/from stand   Upper Body Dressing : Minimal assistance;Sitting (lines)   Lower Body Dressing: Minimal assistance;Sit to/from stand                 General ADL Comments: performed ADL from recliner.  Pt is s/p sx and has IJ line and dressing at abdomen:  multimodal cues to protect/work around.  RN brought dressing for R great toe which has split nail.        Vision                     Perception     Praxis      Cognition   Behavior During Therapy: WFL for tasks assessed/performed Overall  Cognitive Status: History of cognitive impairments - at baseline                       Extremity/Trunk Assessment               Exercises     Shoulder Instructions       General Comments      Pertinent Vitals/ Pain       Pain Assessment: No/denies pain  Home Living Family/patient expects to be discharged to:: Group home                                        Prior Functioning/Environment Level of Independence: Independent            Frequency Min 2X/week     Progress Toward Goals  OT Goals(current goals can now be found in the care plan section)  Progress towards OT goals:  (pt needs slightly more assistance since sx, much due to lines--goals are appropriate)  Acute Rehab  OT Goals Patient Stated Goal: go home Time For Goal Achievement: 06/16/15  Plan      Co-evaluation                 End of Session     Activity Tolerance Patient tolerated treatment well   Patient Left in chair;with call bell/phone within reach   Nurse Communication          Time: 8403-7543 OT Time Calculation (min): 23 min  Charges: OT General Charges $OT Visit: 1 Procedure OT Treatments $Self Care/Home Management : 23-37 mins  Jamario Colina 06/09/2015, 1:15 PM  Lesle Chris, OTR/L [phone] 06/09/2015

## 2015-06-09 NOTE — Evaluation (Signed)
Physical Therapy Evaluation Patient Details Name: [name] MRN: [id number] DOB: 1946/01/14 Today's Date: 06/09/2015   History of Present Illness  69 year old female with recurrent small bowel obstruction probably secondary to adhesions. Pt with MR and was I at group home.  s/p lap R partial colectomy and resection  Clinical Impression  Pt admitted as above and presenting with functional mobility limitations 2* ambulatory balance deficits.  Pt and sister report physician states pt will remain in hospital ~1 week.  In that time, pt has good potential to regain independence with mobility.  However, if dc earlier, SNF placement should be considered dependent on level of assist offered at group home.   Follow Up Recommendations SNF;Home health PT;No PT follow up (dependent on acute stay progress/assist level at group home)    Equipment Recommendations  Rolling walker with 5" wheels;None recommended by PT    Recommendations for Other Services OT consult     Precautions / Restrictions Precautions Precautions: Fall Restrictions Weight Bearing Restrictions: No      Mobility  Bed Mobility               General bed mobility comments: Pt OOB and requesting back to chair  Transfers Overall transfer level: Needs assistance Equipment used: None Transfers: Sit to/from Stand Sit to Stand: Supervision         General transfer comment: cues for use of UEs to self assist  Ambulation/Gait Ambulation/Gait assistance: Min assist Ambulation Distance (Feet): 500 Feet Assistive device: Rolling walker (2 wheeled);None Gait Pattern/deviations: Step-to pattern;Step-through pattern;Decreased step length - right;Decreased step length - left;Shuffle;Trunk flexed Gait velocity: mod pace   General Gait Details: Attempted ambulation sans AD - pt demonstrating mod instability with increased BOS and mild staggering gait.  Deficits largely corrected with use of RW - pt ambulated 480' with RW ,  min assist for stability and cues for posture and position from [employer]            Wheelchair Mobility    Modified Rankin (Stroke Patients Only)       Balance                                             Pertinent Vitals/Pain Pain Assessment: No/denies pain    Home Living Family/patient expects to be discharged to:: Group home                      Prior Function Level of Independence: Independent               Hand Dominance   Dominant Hand: Right    Extremity/Trunk Assessment   Upper Extremity Assessment: Overall WFL for tasks assessed           Lower Extremity Assessment: Overall WFL for tasks assessed      Cervical / Trunk Assessment: Normal  Communication   Communication: No difficulties  Cognition Arousal/Alertness: Awake/alert Behavior During Therapy: WFL for tasks assessed/performed Overall Cognitive Status: History of cognitive impairments - at baseline                      General Comments      Exercises General Exercises - Lower Extremity Ankle Circles/Pumps: AROM;Both;15 reps;Supine      Assessment/Plan    PT Assessment Patient needs continued PT services  PT Diagnosis Difficulty  walking   PT Problem List Decreased balance;Decreased mobility;Decreased knowledge of use of DME;Obesity;Decreased activity tolerance  PT Treatment Interventions DME instruction;Gait training;Functional mobility training;Therapeutic activities;Balance training;Patient/family education   PT Goals (Current goals can be found in the Care Plan section) Acute Rehab PT Goals Patient Stated Goal: go home PT Goal Formulation: With patient Time For Goal Achievement: 06/23/15 Potential to Achieve Goals: Good    Frequency Min 3X/week   Barriers to discharge Decreased caregiver support Pt from group home - SW communicating with group home to acertain level of assist available    Co-evaluation                End of Session   Activity Tolerance: Patient tolerated treatment well Patient left: in chair;with call bell/phone within reach;with family/visitor present Nurse Communication: Mobility status         Time: 8472-0721 PT Time Calculation (min) (ACUTE ONLY): 19 min   Charges:   PT Evaluation $Initial PT Evaluation Tier I: 1 Procedure     PT G Codes:        [name] 2015/06/30, 3:10 PM

## 2015-06-09 NOTE — Progress Notes (Signed)
RT placed patient on CPAP. Patient setting is auto 5-20 cmH2O. Patient stated she did not want humidification. Patient is tolerating well. RT will continue to monitor.

## 2015-06-09 NOTE — Progress Notes (Signed)
Report given to Hinton Dyer, RN and all questions answered. . VSS. All belongings sent with patient including bear and picture.

## 2015-06-10 DIAGNOSIS — E43 Unspecified severe protein-calorie malnutrition: Secondary | ICD-10-CM

## 2015-06-10 DIAGNOSIS — C189 Malignant neoplasm of colon, unspecified: Secondary | ICD-10-CM

## 2015-06-10 LAB — BASIC METABOLIC PANEL
Anion gap: 6 (ref 5–15)
CHLORIDE: 108 mmol/L (ref 101–111)
CO2: 23 mmol/L (ref 22–32)
CREATININE: 0.42 mg/dL — AB (ref 0.44–1.00)
Calcium: 8.6 mg/dL — ABNORMAL LOW (ref 8.9–10.3)
GFR calc Af Amer: >60 mL/min (ref >60)
GFR calc non Af Amer: >60 mL/min (ref >60)
Glucose, Bld: 125 mg/dL — ABNORMAL HIGH (ref 65–99)
Potassium: 4.2 mmol/L (ref 3.5–5.1)
Sodium: 137 mmol/L (ref 135–145)

## 2015-06-10 LAB — CBC
HCT: 31.4 % — ABNORMAL LOW (ref 36.0–46.0)
HEMOGLOBIN: 9.8 g/dL — AB (ref 12.0–15.0)
MCH: 26.8 pg (ref 26.0–34.0)
MCHC: 31.2 g/dL (ref 30.0–36.0)
MCV: 85.8 fL (ref 78.0–100.0)
Platelets: 337 10*3/uL (ref 150–400)
RBC: 3.66 MIL/uL — AB (ref 3.87–5.11)
RDW: 13.6 % (ref 11.5–15.5)
WBC: 6.8 10*3/uL (ref 4.0–10.5)

## 2015-06-10 LAB — MAGNESIUM: MAGNESIUM: 1.8 mg/dL (ref 1.7–2.4)

## 2015-06-10 MED ORDER — DEXTROSE 5 % IV SOLN
3.0000 g | Freq: Once | INTRAVENOUS | Status: AC
  Administered 2015-06-10: 3 g via INTRAVENOUS
  Filled 2015-06-10: qty 6

## 2015-06-10 NOTE — Progress Notes (Signed)
[facility] PROGRESS NOTE  [name] VXB:[id number] DOB: [birth year] DOA: 05/30/2015 PCP: Pcp Not In System  Brief narrative 69 year old female with hyperlipidemia, osteoporosis, prior history of small bowel obstruction, constipation was brought to ED with main concern of several days duration of progressively worsening abd pain, per family member described as constant and throbbing, unknown severity, associated with poor oral intake, nausea and non bloody vomiting.   CT abdomen and pelvis showed recurrent high-grade mid to distal small bowel obstruction with transition point in the periumbilical region, infiltrating mass involving the right colon highly suspicious for colon cancer, at bedtime and prominent mesenteric lymph nodes and/or peritoneal nodules concerning for metastatic disease.   Major events since admission: 6/08 - NG tube placed 6/09 - slight improvement, electrolyte still depleted, needs more potassium 6/10 - no significant improvement in SBP per imaging studies but overall pt is stable  6/13 - tachy, slightly prolonged QT interval on tele, colonoscopy results below, NGT out  6/14 - Laparoscopic-assisted right partial colectomy with resection of terminal ileum. Per Dr. Lucia Gaskins   Assessment/Plan: #1 high-grade small bowel obstruction Status post NG tube placed 06/02/2015 and removed 06/07/2015. Patient with no further nausea vomiting or abdominal pain. Patient passing gas. Patient with bowel movement this morning. Replete electrolytes. Patient has been seen by general surgery and patient diet has been advanced to clear liquids. Follow.  #2 colonic mass/adenocarcinoma Per CT scan. Patient status post laparoscopic assisted right partial colectomy with resection of terminal ileum 06/08/2015 per Dr. Lucia Gaskins. Patient has been placed on clear liquid diet per general surgery. Pathology consistent with adenocarcinoma. Per surgical note patient with multiple prior to new nodules  noted during surgery likely metastatic disease. Consult with oncology for further evaluation and management. Continue supportive care. Mobilization. General surgery following.  #3 mental retardation At baseline.  #4 hyperlipidemia Medications on hold for now.  #5 tachycardia  Improved. Follow.  #6 leukocytosis Likely reactive leukocytosis postoperatively. Patient with no respiratory symptoms. WBC trending down. Will follow for now.  #7 hypertension Continue IV Lopressor. Hydralazine as needed.  #8 bacteriuria Urine culture showed only 35,000 colonies. Patient completed 5 days of Rocephin.  #9 history of seizures Stable. Patient with no seizures. Continue IV Keppra. When tolerating oral intake will resume home regimen.  #10 severe protein calorie malnutrition Once tolerating oral intake could start nutritional supplementation.  #11 obesity  #12 hypokalemia/hypomagnesemia Repleted.  #13 prophylaxis Heparin for DVT prophylaxis.   Code Status: Full Family Communication: Updated patient and sister at bedside. Disposition Plan: Home when medically stable.   Consultants:  Gastroenterology: Dr. Fuller Plan 05/31/2015  Gen. surgery: Dr. Harlow Asa 05/31/2015  Procedures:  Chest x-ray 06/08/2015  Abdominal films 06/07/2015, 06/05/2015, 06/04/2015, 06/03/2015  Right IJ catheter 06/08/2015  CT abdomen and pelvis 05/30/2015  Laparoscopic assisted right partial colectomy with resection of terminal ileum 06/08/2015 Dr. Lucia Gaskins  Colonoscopy 06/07/2015 per Dr. Ardis Hughs There was a non-circumferential, clearly malignant mass in the ascending colon (just distal to the IC valve), measured about 6-7 cm long. The mass was not obstructive. Multiple biopsies were taken. The examination was otherwise normal  Antibiotics:  IV Rocephin 05/31/2015>>>> 06/04/2015  IV Cefotan 06/08/2015 on call to or>>>> 06/08/2015  Oral Flagyl 06/07/2015>>>> 06/07/2015  HPI/Subjective: Patient denies  abdominal pain. No nausea. No vomiting. Patient with bowel movement this morning per patient and per nursing.  Objective: Filed Vitals:   06/10/15 0513  BP: 149/76  Pulse: 107  Temp: 97.7 F (36.5 C)  Resp: 16  Intake/Output Summary (Last 24 hours) at 06/10/15 1353 Last data filed at 06/10/15 0930  Gross per 24 hour  Intake 1967.5 ml  Output   3950 ml  Net -1982.5 ml   Filed Weights   06/08/15 0444 06/09/15 0400 06/10/15 0547  Weight: 83.689 kg (184 lb 8 oz) 90.7 kg (199 lb 15.3 oz) 85.911 kg (189 lb 6.4 oz)    Exam:   General:  NAD  Cardiovascular: RRR  Respiratory: CTAB  Abdomen: Soft/NT/ND/+ BS.   Musculoskeletal: No clubbing cyanosis or edema.  Data Reviewed: Basic Metabolic Panel:  Recent Labs Lab 06/06/15 0510 06/07/15 0445 06/08/15 0451 06/09/15 0600 06/10/15 0510  NA 140 138 136 137 137  K 4.3 4.2 3.6 3.8 4.2  CL 103 105 103 104 108  CO2 24 22 26 24 23   GLUCOSE 102* 103* 126* 167* 125*  BUN <5* <5* <5* <5* <5*  CREATININE 0.54 0.48 0.48 0.42* 0.42*  CALCIUM 8.8* 8.6* 8.9 8.4* 8.6*  MG 1.8 1.8 1.7 1.5* 1.8   Liver Function Tests: No results for input(s): AST, ALT, ALKPHOS, BILITOT, PROT, ALBUMIN in the last 168 hours. No results for input(s): LIPASE, AMYLASE in the last 168 hours. No results for input(s): AMMONIA in the last 168 hours. CBC:  Recent Labs Lab 06/04/15 1236 06/06/15 0510 06/08/15 0451 06/09/15 0600 06/10/15 0510  WBC 7.5 7.2 9.1 11.5* 6.8  HGB 10.4* 10.2* 10.2* 9.2* 9.8*  HCT 32.6* 31.5* 31.9* 28.6* 31.4*  MCV 87.4 86.8 86.9 85.4 85.8  PLT 381 348 343 307 337   Cardiac Enzymes: No results for input(s): CKTOTAL, CKMB, CKMBINDEX, TROPONINI in the last 168 hours. BNP (last 3 results) No results for input(s): BNP in the last 8760 hours.  ProBNP (last 3 results) No results for input(s): PROBNP in the last 8760 hours.  CBG: No results for input(s): GLUCAP in the last 168 hours.  Recent Results (from the past 240  hour(s))  Clostridium Difficile by PCR (not at [facility])     Status: None   Collection Time: 06/01/15  5:05 PM  Result Value Ref Range Status   C difficile by pcr NEGATIVE NEGATIVE Final  Surgical pcr screen     Status: None   Collection Time: 06/08/15  8:24 AM  Result Value Ref Range Status   MRSA, PCR NEGATIVE NEGATIVE Final   Staphylococcus aureus NEGATIVE NEGATIVE Final    Comment:        The Xpert SA Assay (FDA approved for NASAL specimens in patients over 38 years of age), is one component of a comprehensive surveillance program.  Test performance has been validated by [facility] for patients greater than or equal to 49 year old. It is not intended to diagnose infection nor to guide or monitor treatment.      Studies: Dg Chest Port 1 View  06/08/2015   CLINICAL DATA:  Central line placement.  EXAM: PORTABLE CHEST - 1 VIEW  COMPARISON:  Single view of the chest 06/03/2015.  FINDINGS: A new right IJ catheter is in place with the tip projecting the right atrium. Recommend withdrawal of approximately 5-6 cm. There is no pneumothorax. Cardiomegaly is noted. Mild left basilar atelectasis is seen.  IMPRESSION: Tip of right IJ catheter projects in the right atrium. Recommend withdrawal of 5-6 cm. Negative for pneumothorax. Findings called to the patient's nurse, Santiago Glad, at the time of interpretation.   Electronically Signed   By: Inge Rise M.D.   On: 06/08/2015 16:38    Scheduled  Meds: . heparin subcutaneous  5,000 Units Subcutaneous 3 times per day  . levETIRAcetam  1,000 mg Intravenous Q12H  . metoprolol  2.5 mg Intravenous 3 times per day  . sodium chloride  3 mL Intravenous Q12H   Continuous Infusions: . dextrose 5 % and 0.45% NaCl 1,000 mL with potassium chloride 40 mEq infusion 75 mL/hr at 06/10/15 0600    Principal Problem:   SBO (small bowel obstruction) Active Problems:   Mental retardation   Convulsions   HLD (hyperlipidemia)   Colonic mass   Small bowel  obstruction   UTI (lower urinary tract infection)   Protein-calorie malnutrition, severe   Colon cancer   Seizures   Essential hypertension    Time spent: [place] MD [facility] Pager [phone]. If 7PM-7AM, please contact night-coverage at [email or web address], password [facility] 06/10/2015, 1:53 PM  LOS: 11 days

## 2015-06-10 NOTE — Progress Notes (Addendum)
Patient ID: [name], female   DOB: Feb 22, 1946, 69 y.o.   MRN: [id number] 2 Days Post-Op  Subjective: Pt feels well today.  No new complaints.  Says she had a BM, but nothing documented  Objective: Vital signs in last 24 hours: Temp:  [97.7 F (36.5 C)-98.2 F (36.8 C)] 97.7 F (36.5 C) (06/16 0513) Pulse Rate:  [100-110] 107 (06/16 0513) Resp:  [16-17] 16 (06/16 0513) BP: (120-149)/(60-78) 149/76 mmHg (06/16 0513) SpO2:  [96 %-97 %] 97 % (06/16 0513) Weight:  [85.911 kg (189 lb 6.4 oz)] 85.911 kg (189 lb 6.4 oz) (06/16 0547) Last BM Date: 06/08/15  Intake/Output from previous day: 06/15 0701 - 06/16 0700 In: 1727.5 [I.V.:1627.5; IV Piggyback:100] Out: 2950 [Urine:2950] Intake/Output this shift: Total I/O In: -  Out: 450 [Urine:450]  PE: Abd: soft, appropriately tender, incisions c/d/i with honeycomb dressing in place, few BS, ND Heart: regular  Lab Results:   Recent Labs  06/09/15 0600 06/10/15 0510  WBC 11.5* 6.8  HGB 9.2* 9.8*  HCT 28.6* 31.4*  PLT 307 337   BMET  Recent Labs  06/09/15 0600 06/10/15 0510  NA 137 137  K 3.8 4.2  CL 104 108  CO2 24 23  GLUCOSE 167* 125*  BUN <5* <5*  CREATININE 0.42* 0.42*  CALCIUM 8.4* 8.6*   PT/INR No results for input(s): LABPROT, INR in the last 72 hours. CMP     Component Value Date/Time   NA 137 06/10/2015 0510   K 4.2 06/10/2015 0510   CL 108 06/10/2015 0510   CO2 23 06/10/2015 0510   GLUCOSE 125* 06/10/2015 0510   BUN <5* 06/10/2015 0510   CREATININE 0.42* 06/10/2015 0510   CALCIUM 8.6* 06/10/2015 0510   PROT 7.6 05/30/2015 1211   ALBUMIN 3.4* 05/30/2015 1211   AST 30 05/30/2015 1211   ALT 17 05/30/2015 1211   ALKPHOS 64 05/30/2015 1211   BILITOT <0.1* 05/30/2015 1211   GFRNONAA >60 06/10/2015 0510   GFRAA >60 06/10/2015 0510   Lipase     Component Value Date/Time   LIPASE 11* 05/30/2015 1211       Studies/Results: Dg Chest Port 1 View  06/08/2015   CLINICAL DATA:  Central line  placement.  EXAM: PORTABLE CHEST - 1 VIEW  COMPARISON:  Single view of the chest 06/03/2015.  FINDINGS: A new right IJ catheter is in place with the tip projecting the right atrium. Recommend withdrawal of approximately 5-6 cm. There is no pneumothorax. Cardiomegaly is noted. Mild left basilar atelectasis is seen.  IMPRESSION: Tip of right IJ catheter projects in the right atrium. Recommend withdrawal of 5-6 cm. Negative for pneumothorax. Findings called to the patient's nurse, Santiago Glad, at the time of interpretation.   Electronically Signed   By: Inge Rise M.D.   On: 06/08/2015 16:38    Anti-infectives: Anti-infectives    Start     Dose/Rate Route Frequency Ordered Stop   06/08/15 0600  cefoTEtan (CEFOTAN) 2 g in dextrose 5 % 50 mL IVPB     2 g 100 mL/hr over 30 Minutes Intravenous On call to O.R. 06/07/15 1513 06/08/15 1300   06/07/15 1600  metroNIDAZOLE (FLAGYL) tablet 1,000 mg     1,000 mg Oral 3 times per day on Mon 06/07/15 1513 06/07/15 2335   06/07/15 1600  neomycin (MYCIFRADIN) tablet 1,000 mg     1,000 mg Oral 3 times per day on Mon 06/07/15 1513 06/07/15 2334   05/31/15 0800  cefTRIAXone (ROCEPHIN) 1  g in dextrose 5 % 50 mL IVPB - Premix  Status:  Discontinued     1 g 100 mL/hr over 30 Minutes Intravenous Every 24 hours 05/31/15 0742 06/04/15 1143       Assessment/Plan   POD 2, s/p lap assisted extended right colectomy with TI for adenocarcinoma --> 06/08/2015 - Dr. Lucia Gaskins  -patient doing well.  Will give clear liquids -mobilize and pulm toilet -multiple peritoneal nodules noted during surgery, like metastatic disease, await final path.  May need oncology follow up, but question if she is truly a good chemo candidate.  Final path pending.  SBO  -resolved DVT proph  -heparin and scds   LOS: 11 days    OSBORNE,KELLY E 06/10/2015, 8:29 AM Pager: [phone]  Agree with above. Has done better than expected. She said that the next surgery is "Marie's turn".  Alphonsa Overall, MD, [facility] Pager: [phone] Office phone:  [phone]

## 2015-06-10 NOTE — Progress Notes (Signed)
Pt has refused CPAP for the night, RT to monitor and assess as needed.  

## 2015-06-11 DIAGNOSIS — C786 Secondary malignant neoplasm of retroperitoneum and peritoneum: Secondary | ICD-10-CM

## 2015-06-11 DIAGNOSIS — R109 Unspecified abdominal pain: Secondary | ICD-10-CM

## 2015-06-11 DIAGNOSIS — D630 Anemia in neoplastic disease: Secondary | ICD-10-CM

## 2015-06-11 DIAGNOSIS — E46 Unspecified protein-calorie malnutrition: Secondary | ICD-10-CM

## 2015-06-11 DIAGNOSIS — R112 Nausea with vomiting, unspecified: Secondary | ICD-10-CM

## 2015-06-11 LAB — BASIC METABOLIC PANEL
ANION GAP: 7 (ref 5–15)
BUN: <5 mg/dL — ABNORMAL LOW (ref 6–20)
CHLORIDE: 108 mmol/L (ref 101–111)
CO2: 22 mmol/L (ref 22–32)
CREATININE: 0.42 mg/dL — AB (ref 0.44–1.00)
Calcium: 8.8 mg/dL — ABNORMAL LOW (ref 8.9–10.3)
GFR calc non Af Amer: >60 mL/min (ref >60)
GLUCOSE: 124 mg/dL — AB (ref 65–99)
Potassium: 4.5 mmol/L (ref 3.5–5.1)
Sodium: 137 mmol/L (ref 135–145)

## 2015-06-11 LAB — FOLATE: Folate: 15.5 ng/mL (ref >5.9)

## 2015-06-11 LAB — IRON AND TIBC
IRON: 18 ug/dL — AB (ref 28–170)
Saturation Ratios: 7 % — ABNORMAL LOW (ref 10.4–31.8)
TIBC: 274 ug/dL (ref 250–450)
UIBC: 256 ug/dL

## 2015-06-11 LAB — RETICULOCYTES
RBC.: 3.56 MIL/uL — ABNORMAL LOW (ref 3.87–5.11)
RETIC COUNT ABSOLUTE: 57 10*3/uL (ref 19.0–186.0)
Retic Ct Pct: 1.6 % (ref 0.4–3.1)

## 2015-06-11 LAB — CBC
HCT: 31.2 % — ABNORMAL LOW (ref 36.0–46.0)
Hemoglobin: 9.9 g/dL — ABNORMAL LOW (ref 12.0–15.0)
MCH: 27.6 pg (ref 26.0–34.0)
MCHC: 31.7 g/dL (ref 30.0–36.0)
MCV: 86.9 fL (ref 78.0–100.0)
PLATELETS: 365 10*3/uL (ref 150–400)
RBC: 3.59 MIL/uL — ABNORMAL LOW (ref 3.87–5.11)
RDW: 13.7 % (ref 11.5–15.5)
WBC: 5.5 10*3/uL (ref 4.0–10.5)

## 2015-06-11 LAB — FERRITIN: Ferritin: 42 ng/mL (ref 11–307)

## 2015-06-11 LAB — VITAMIN B12: Vitamin B-12: 942 pg/mL — ABNORMAL HIGH (ref 180–914)

## 2015-06-11 MED ORDER — SODIUM CHLORIDE 0.9 % IJ SOLN
10.0000 mL | INTRAMUSCULAR | Status: DC | PRN
  Administered 2015-06-11 – 2015-06-12 (×2): 10 mL
  Administered 2015-06-12 – 2015-06-13 (×2): 20 mL
  Filled 2015-06-11 (×4): qty 40

## 2015-06-11 MED ORDER — METOPROLOL TARTRATE 1 MG/ML IV SOLN
5.0000 mg | Freq: Three times a day (TID) | INTRAVENOUS | Status: DC
  Administered 2015-06-11 – 2015-06-12 (×2): 5 mg via INTRAVENOUS
  Filled 2015-06-11 (×6): qty 5

## 2015-06-11 MED ORDER — ENSURE ENLIVE PO LIQD
237.0000 mL | Freq: Three times a day (TID) | ORAL | Status: DC
  Administered 2015-06-11 – 2015-06-14 (×8): 237 mL via ORAL

## 2015-06-11 MED ORDER — SODIUM CHLORIDE 0.9 % IJ SOLN
10.0000 mL | Freq: Two times a day (BID) | INTRAMUSCULAR | Status: DC
  Administered 2015-06-11: 20 mL

## 2015-06-11 MED ORDER — METOPROLOL TARTRATE 1 MG/ML IV SOLN
2.5000 mg | Freq: Once | INTRAVENOUS | Status: AC
  Administered 2015-06-11: 2.5 mg via INTRAVENOUS
  Filled 2015-06-11: qty 5

## 2015-06-11 MED ORDER — HYDROCODONE-ACETAMINOPHEN 5-325 MG PO TABS: 1.0000 | ORAL_TABLET | ORAL | Status: DC | PRN

## 2015-06-11 MED ORDER — ENSURE PUDDING PO PUDG: 1.0000 | Freq: Three times a day (TID) | ORAL | Status: DC

## 2015-06-11 NOTE — Progress Notes (Signed)
CSW assisting with d/c planning. CSW continuing to wait to hear if group home will accept pt back over the weekend. SNF offers available if a higher level of care is needed. CSW made second attempt to contact group home and left message in order to get response regarding if group home can accept pt back over w/e.   CSW to continue to follow.  Alison Murray, MSW, LCSW Clinical Social Work Coverage for [employer], [place]

## 2015-06-11 NOTE — Progress Notes (Signed)
CSW assisting with d/c planning. Waiting to hear if group home will accept pt back over the weekend. SNF offers available if a higher level of care is needed.   Werner Lean LCSW [phone]

## 2015-06-11 NOTE — Progress Notes (Signed)
[place] for assistance with IV Keppra Dosing Indication: Hx seizure disorder; currently NPO  No Known Allergies  Patient Measurements: Height: 5\' 4"  (162.6 cm) (per chart) Weight: 190 lb 0.6 oz (86.2 kg) IBW/kg (Calculated) : 54.7   Vital Signs: Temp: 98.7 F (37.1 C) (06/17 0557) Temp Source: Oral (06/17 0557) BP: 124/78 mmHg (06/17 0557) Pulse Rate: 113 (06/17 0557) Intake/Output from previous day: 06/16 0701 - 06/17 0700 In: 2753 [P.O.:840; I.V.:1803; IV Piggyback:110] Out: 3750 [Urine:3750] Intake/Output from this shift: Total I/O In: -  Out: 775 [Urine:775]  Labs:  Recent Labs  06/09/15 0600 06/10/15 0510 06/11/15 0450  WBC 11.5* 6.8 5.5  HGB 9.2* 9.8* 9.9*  HCT 28.6* 31.4* 31.2*  PLT 307 337 365  CREATININE 0.42* 0.42* 0.42*  MG 1.5* 1.8  --    Estimated Creatinine Clearance: 69.5 mL/min (by C-G formula based on Cr of 0.42).     Medical History: Past Medical History  Diagnosis Date  . Hypercholesteremia   . Constipation   . Osteoporosis   . Seizure   . Edema   . Mental retardation   . SBO (small bowel obstruction) 11/2013, 05/2015    likely due to adhesions  . Colonic mass 05/2015    right, concerning for cancer    Medications:  Scheduled Meds: . heparin subcutaneous  5,000 Units Subcutaneous 3 times per day  . levETIRAcetam  1,000 mg Intravenous Q12H  . metoprolol  2.5 mg Intravenous 3 times per day  . sodium chloride  3 mL Intravenous Q12H   Continuous Infusions:   PRN Meds:.acetaminophen **OR** acetaminophen, hydrALAZINE, HYDROcodone-acetaminophen, morphine injection, ondansetron **OR** ondansetron (ZOFRAN) IV  Assessment: 69 y/o F with PMH that includes seizure disorder and mental retardation, on felbamate prior to admission, now with SBO and unable to tolerate oral medications.  Unfortunately there is no IV form of felbamate available.  Discussed with attending MD 6/6 - decision was made to  use IV Keppra for now and pharmacy assistance with dosing was requested.   Today, 06/11/2015:  R colon mass -- s/p lap assisted R colectomy with resection of right terminal ileum on 6/14 with (+) nodes. Oncology to see.  Remains on Keppra 1 gram IV q12h  No seizures reported.  Tolerating Keppra IV.  Diet was advanced to full liquids today.   Goal of Therapy:  Seizure prevention  Plan:   For now, continue Keppra at 1000 mg IV q12h.  Follow clinical course.  When patient can safely tolerate oral or enteral medications, resume felbamate PO or via tube at PTA dosage (1200 mg BID) and stop Keppra.  Clayburn Pert, PharmD, BCPS Pager: [phone] 06/11/2015  12:13 PM

## 2015-06-11 NOTE — Discharge Instructions (Signed)
[facility]      [facility] [phone]  OPEN ABDOMINAL SURGERY: POST OP INSTRUCTIONS  Always review your discharge instruction sheet given to you by the facility where your surgery was performed.  IF YOU HAVE DISABILITY OR FAMILY LEAVE FORMS, YOU MUST BRING THEM TO THE OFFICE FOR PROCESSING.  PLEASE DO NOT GIVE THEM TO YOUR DOCTOR.  1. A prescription for pain medication may be given to you upon discharge.  Take your pain medication as prescribed, if needed.  If narcotic pain medicine is not needed, then you may take acetaminophen (Tylenol) or ibuprofen (Advil) as needed. 2. Take your usually prescribed medications unless otherwise directed. 3. If you need a refill on your pain medication, please contact your pharmacy. They will contact our office to request authorization.  Prescriptions will not be filled after 5pm or on week-ends. 4. You should follow a light diet the first few days after arrival home, such as soup and crackers, pudding, etc.unless your doctor has advised otherwise. A high-fiber, low fat diet can be resumed as tolerated.   Be sure to include lots of fluids daily. Most patients will experience some swelling and bruising on the chest and neck area.  Ice packs will help.  Swelling and bruising can take several days to resolve 5. Most patients will experience some swelling and bruising in the area of the incision. Ice pack will help. Swelling and bruising can take several days to resolve..  6. It is common to experience some constipation if taking pain medication after surgery.  Increasing fluid intake and taking a stool softener will usually help or prevent this problem from occurring.  A mild laxative (Milk of Magnesia or Miralax) should be taken according to package directions if there are no bowel movements after 48 hours. 7.  You may have steri-strips (small skin tapes) in place directly over the incision.  These strips should be left on the skin for 7-10 days.  If your  surgeon used skin glue on the incision, you may shower in 24 hours.  The glue will flake off over the next 2-3 weeks.  Any sutures or staples will be removed at the office during your follow-up visit. You may find that a light gauze bandage over your incision may keep your staples from being rubbed or pulled. You may shower and replace the bandage daily. 8. ACTIVITIES:  You may resume regular (light) daily activities beginning the next day--such as daily self-care, walking, climbing stairs--gradually increasing activities as tolerated.  You may have sexual intercourse when it is comfortable.  Refrain from any heavy lifting or straining until approved by your doctor. a. You may drive when you no longer are taking prescription pain medication, you can comfortably wear a seatbelt, and you can safely maneuver your car and apply brakes b. Return to Work: ___________________________________ 9. You should see your doctor in the office for a follow-up appointment approximately two weeks after your surgery.  Make sure that you call for this appointment within a day or two after you arrive home to insure a convenient appointment time. OTHER INSTRUCTIONS:  _____________________________________________________________ _____________________________________________________________  WHEN TO CALL YOUR DOCTOR: 1. Fever over 101.0 2. Inability to urinate 3. Nausea and/or vomiting 4. Extreme swelling or bruising 5. Continued bleeding from incision. 6. Increased pain, redness, or drainage from the incision. 7. Difficulty swallowing or breathing 8. Muscle cramping or spasms. 9. Numbness or tingling in hands or feet or around lips.  The clinic staff is available to   answer your questions during regular business hours.  Please don't hesitate to call and ask to speak to one of the nurses if you have concerns.  For further questions, please visit [email or web address]   

## 2015-06-11 NOTE — Progress Notes (Signed)
Physical Therapy Treatment Patient Details Name: [name] MRN: [id number] DOB: 09-18-1946 Today's Date: 06/11/2015    History of Present Illness 69 year old female with recurrent small bowel obstruction probably secondary to adhesions. Pt with MR and was I at group home.  s/p lap R partial colectomy and resection    PT Comments    Pt up in recliner with sister visiting.  Sister reports pt lives in a small Group Home that only has about 6 residents.  Also stated, pt amb by herself with no AD.  "sometimes she would hold to me".  Assisted pt out of recliner to BR and amb in hallway.  Mobility is at a level where she could D/C back to her familiar environment at her [place].   Follow Up Recommendations  SNF;Home health PT;No PT follow up (preferably D/C back to her Group Home (familiar environment))     Equipment Recommendations       Recommendations for Other Services       Precautions / Restrictions Precautions Precautions: Fall Restrictions Weight Bearing Restrictions: No    Mobility  Bed Mobility               General bed mobility comments: Pt OOB and requesting back to chair  Transfers Overall transfer level: Needs assistance Equipment used: None Transfers: Sit to/from Stand Sit to Stand: Supervision         General transfer comment: cues for use of UEs to self assist, able to self perform  Ambulation/Gait Ambulation/Gait assistance: Supervision;Min guard Ambulation Distance (Feet): 500 Feet Assistive device: Rolling walker (2 wheeled);None Gait Pattern/deviations: Step-to pattern;Step-through pattern;Decreased stride length;Trunk flexed Gait velocity: WFL   General Gait Details: assisted to BR then amb in hallway with RW for increased safety.  Sister reports, pt usually does not walk with anything and tends to hold to furnture/rails/wall at times within her group home.    Stairs            Wheelchair Mobility    Modified Rankin (Stroke  Patients Only)       Balance                                    Cognition Arousal/Alertness: Awake/alert Behavior During Therapy: WFL for tasks assessed/performed Overall Cognitive Status: History of cognitive impairments - at baseline                      Exercises      General Comments        Pertinent Vitals/Pain Pain Assessment: No/denies pain    Home Living                      Prior Function            PT Goals (current goals can now be found in the care plan section) Progress towards PT goals: Progressing toward goals    Frequency       PT Plan      Co-evaluation             End of Session Equipment Utilized During Treatment: Gait belt Activity Tolerance: Patient tolerated treatment well Patient left: in chair;with call bell/phone within reach;with family/visitor present     Time: 1440-1505 PT Time Calculation (min) (ACUTE ONLY): 25 min  Charges:  $Gait Training: 8-22 mins $Therapeutic Activity: 8-22 mins  G Codes:      Rica Koyanagi  PTA [facility]  Acute  Rehab Pager      [phone]

## 2015-06-11 NOTE — Consult Note (Signed)
[place]  [phone]   HEMATOLOGY ONCOLOGY CONSULTATION   [name]  DOB: [birth year]  MR#: [id number]  CSN#: [id number]    Requesting Physician: [facility]    Patient Care Team: Pcp Not In System as PCP - General  Reason for conslt: Colon Cancer      HPI:   69 y.o.  female with prior history of SBO in 2014 among other medical issues listed below, admitted on 6/5 with 1 week history of progressive abdominal pain, associated with poor oral intake. She also reported nausea and non bloody vomiting associated with symptoms. She had loose stools on presentation. She denied any respiratory or cardiac complaints. She was fatigued. Abdominal x ray were suspicious for small bowel obstruction.  CT abdomen and pelvis on 6/5 showed recurrent high-grade mid to distal small bowel obstruction with transition point in the periumbilical region, infiltrating mass involving the right colon highly suspicious for colon cancer, and prominent mesenteric lymph nodes and/or peritoneal nodules concerning for metastatic disease.    Colonoscopy 06/07/2015 per Dr. Ardis Hughs showed a non-circumferential, clearly malignant mass in the ascending colon (just distal to the Ileo Cecal valve), measuring about 6-7 cm long. The mass was not obstructive. Multiple biopsies were taken, consistent with adenocarcinoma. The examination was otherwise normal. CEA on 6/5 was 27.1 No family history of colon cancer. Denies risk factors for HIV or hepatitis. Denies Diabetes. DeniesTobacco or ETOH. Denies prior radiation. Denies melena.or hematochezia. She has a history of anemia on oral iron.  She underwent a Laparoscopic assisted right partial colectomy with resection of terminal ileum 06/08/2015 Dr. Lucia Gaskins. Multiple peritoneal nodules noted during surgery, likely metastatic disease. Path report Case number [id number], positive for invasive well differentiated adenocarcinoma with abundant extracellular  mucin, 9 cm, invading through underlying muscularis mucosa, involving the serosal surface. 4 extramural satellite deposits identified. Margins are negative, 3 of 29 lymph nodes are positive. The primary invasive tumor is 2 cm from the radial resection margin at closest approach. Two additional tubular adenomas without high grade dysplasia are identified. She is  pT4a, pN1b. The tumor will be sent for MMR testing and MSI testing by PCR.   We were kindly requested to see the patient with recommendations.  Past medical history:      Past Medical History  Diagnosis Date  . Hypercholesteremia   . Constipation   . Osteoporosis   . Seizure   . Edema   . Mild cognitive impairment   . SBO (small bowel obstruction) 11/2013, 05/2015    likely due to adhesions  . Colon Cancer 05/2015        Past surgical history:      Past Surgical History  Procedure Laterality Date  . Cholecystectomy    . Colonoscopy with propofol N/A 06/07/2015    Procedure: COLONOSCOPY WITH PROPOFOL;  Surgeon: [name], MD;  Location: [facility] END[facility]COPY;  Service: Endoscopy;  Laterality: N/A;  . Laparoscopic partial right colectomy N/A 06/08/2015    Procedure: LAPAROSCOPIC PARTIAL COLECTOMY ;WITH RESECTION OF RIGHT TERMINAL ILIUM;  Surgeon: Alphonsa Overall, MD;  Location: [facility] ORS;  Service: General;  Laterality: N/A;    Medications:  Scheduled Meds: . heparin subcutaneous  5,000 Units Subcutaneous 3 times per day  . levETIRAcetam  1,000 mg Intravenous Q12H  . metoprolol  2.5 mg Intravenous 3 times per day  . sodium chloride  3 mL Intravenous Q12H   Continuous Infusions:  PRN Meds:.acetaminophen **OR** acetaminophen, hydrALAZINE, HYDROcodone-acetaminophen, morphine injection, ondansetron **OR**  ondansetron (ZOFRAN) IV  Allergies: No Known Allergies  Family history:  Remarkable for 1 brother who died with lung cancer, otherwise FHx is negative for other malignancies                                  Social history: History    Social History  . Marital Status: Single    Spouse Name: N/A  . Number of Children: 0  . Years of Education: N/A   Occupational History  . She has mild cognitive impairment   Social History Main Topics  . Smoking status: Never Smoker   . Smokeless tobacco: Not on file  . Alcohol Use: No  . Drug Use: No  . Sexual Activity: No   Other Topics Concern  . Lives in a group home   Social History Narrative    ROS: Constitutional: Denies fevers, chills or abnormal night sweats Eyes: Denies blurriness of vision, double vision or watery eyes Ears, nose, mouth, throat, and face: Denies mucositis or sore throat Respiratory: Denies cough, dyspnea or wheezes Cardiovascular: Denies palpitation, chest discomfort or lower extremity swelling Gastrointestinal:  See HPI Skin: Denies abnormal skin rashes Lymphatics: Denies new lymphadenopathy or easy bruising Neurological:Denies numbness, tingling or new weaknesses Behavioral/Psych: Mood is stable, no new changes  All other systems were reviewed with the patient and are negative.    Physical Exam     Filed Vitals:   06/11/15 0557  BP: 124/78  Pulse: 113  Temp: 98.7 F (37.1 C)  Resp: 18   Filed Weights   06/09/15 0400 06/10/15 0547 06/11/15 0557  Weight: 199 lb 15.3 oz (90.7 kg) 189 lb 6.4 oz (85.911 kg) 190 lb 0.6 oz (86.2 kg)    GENERAL: alert, no distress and comfortable SKIN: skin color, texture, turgor are normal, no rashes or significant lesions. Hirsutism noted. EYES: normal, conjunctiva are pink and non-injected, sclera clear OROPHARYNX:no exudate, no erythema and lips, buccal mucosa, and tongue normal  NECK: supple, thyroid normal size, non-tender, without nodularity. Right IJ line LYMPH:  no palpable lymphadenopathy in the cervical, axillary or inguinal LUNGS: clear to auscultation and percussion with normal breathing effort HEART: regular rate & rhythm and no murmurs and 1+ bilateral lower extremity  edema ABDOMEN:abdomen soft incisional tenderness, and normal bowel sounds Musculoskeletal:no cyanosis of digits and no clubbing  PSYCH: alert & oriented x 3 with fluent speech NEURO: no focal motor/sensory deficits   Lab results:       CBC  Recent Labs Lab 06/06/15 0510 06/08/15 0451 06/09/15 0600 06/10/15 0510 06/11/15 0450  WBC 7.2 9.1 11.5* 6.8 5.5  HGB 10.2* 10.2* 9.2* 9.8* 9.9*  HCT 31.5* 31.9* 28.6* 31.4* 31.2*  PLT 348 343 307 337 365  MCV 86.8 86.9 85.4 85.8 86.9  MCH 28.1 27.8 27.5 26.8 27.6  MCHC 32.4 32.0 32.2 31.2 31.7  RDW 13.4 13.6 13.4 13.6 13.7     Chemistries   Recent Labs Lab 06/06/15 0510 06/07/15 0445 06/08/15 0451 06/09/15 0600 06/10/15 0510 06/11/15 0450  NA 140 138 136 137 137 137  K 4.3 4.2 3.6 3.8 4.2 4.5  CL 103 105 103 104 108 108  CO2 $Re'24 22 26 24 23 22  'Cnz$ GLUCOSE 102* 103* 126* 167* 125* 124*  BUN <5* <5* <5* <5* <5* <5*  CREATININE 0.54 0.48 0.48 0.42* 0.42* 0.42*  CALCIUM 8.8* 8.6* 8.9 8.4* 8.6* 8.8*  MG 1.8 1.8 1.7 1.5*  1.8  --      Studies:      Ct Abdomen Pelvis W Contrast  05/30/2015    COMPARISON:  Radiographs 12/13/2013.  CT 12/10/2013.  FINDINGS: Lower chest: Mildly increased atelectasis at both lung bases. There is no pleural or pericardial effusion. A small hiatal hernia is noted.  Hepatobiliary: Intra and extrahepatic biliary dilatation is similar to the prior examination. The common bile duct measures up to 14 mm in diameter and appears dilated to the ampulla. The gallbladder is surgically absent. No focal hepatic abnormalities identified.  Pancreas: Unremarkable. No pancreatic ductal dilatation or surrounding inflammatory changes.  Spleen: Normal in size without focal abnormality.  Adrenals/Urinary Tract: Both adrenal glands appear normal.There are tiny low-density renal lesions bilaterally, likely cysts. No suspicious renal finding or hydronephrosis. There is no evidence of urinary tract calculus. The bladder is largely  decompressed.  Stomach/Bowel: There is recurrent moderate distension of the stomach and proximal to mid small bowel. The distal small bowel is decompressed. Transition zone appears to be in the periumbilical region where there is mild twisting of the small bowel but no discrete volvulus or soft tissue mass.The colon is normal in caliber. There is stool throughout the colon. There is strong concern of an infiltrating mass involving the right colon, measuring up to 6.1 cm transverse on image 47. There are prominent adjacent lymph nodes or peritoneal nodules, described below. No evidence of bowel perforation.  Vascular/Lymphatic: Stable atherosclerosis of the aorta, its branches and the iliac arteries. The left renal vein is circumaortic. There are multiple prominent lymph nodes around the right colon, including a 12 mm nodule lateral to it on image 42. Prominent ileocecal mesenteric nodes measure up to 8 mm on image 51. There is a prominent central mesenteric node measuring 9 mm on image 53. There is a small amount of perihepatic and pelvic ascites. There is no generalized peritoneal nodularity.  Reproductive: Stable uterine atrophy.  No adnexal mass.  Other: Stable postsurgical changes within the low anterior abdominal wall. No hernia identified.  Musculoskeletal: No acute or significant osseous findings. Moderate lumbar spondylosis appears unchanged.  IMPRESSION: 1. Recurrent high-grade mid to distal small bowel obstruction with transition point in the periumbilical region. 2. Strong concern of infiltrating mass involving the right colon, highly suspicious for colon cancer. There are adjacent prominent mesenteric lymph nodes and/or peritoneal nodules concerning for metastatic disease. No distant metastases identified. This colon mass does not appear to be the etiology for the bowel obstruction. 3. Small amount of free pelvic fluid without generalized peritoneal nodularity. 4. Generally stable biliary dilatation  status post cholecystectomy.   Electronically Signed   By: Richardean Sale M.D.   On: 05/30/2015 16:54   Dg Abd Acute W/chest  06/03/2015   CLINICAL DATA:  Small-bowel obstruction  EXAM: DG ABDOMEN ACUTE W/ 1V CHEST  COMPARISON:  06/02/2015  FINDINGS: Small bowel dilatation shows mild interval improvement. NG tube in the stomach. No free air. Colon decompressed. Surgical clips in the gallbladder fossa.  IMPRESSION: Dilated small bowel loops show mild interval improvement from the prior study consistent with small bowel obstruction.   Electronically Signed   By: Franchot Gallo M.D.   On: 06/03/2015 14:49   Path report:  Accession: [id number] Received: 06/08/2015 Alphonsa Overall DOB: November 27, 1946 Age: 50 Gender: F Reported: 06/10/2015 [address] PORT OF SURGICAL PATHOLOGY FINAL DIAGNOSIS Diagnosis Colon, segmental resection for tumor, right colon and terminal ileum - INVASIVE WELL DIFFERENTIATED ADENOCARCINOMA WITH ABUNDANT EXTRACELLULAR MUCIN,  MEASURING 9 CM IN GREATEST DIMENSION. - TUMOR INVADES THROUGH UNDERLYING MUSCULARIS MUCOSA TO INVOLVE SEROSAL SURFACE. - FOUR EXTRAMURAL SATELLITE TUMOR DEPOSITS ARE IDENTIFIED. - MARGINS ARE NEGATIVE. - THREE OF TWENTY-NINE LYMPH NODES ARE POSITIVE FOR METASTATIC CARCINOMA (3/29). - SEE ONCOLOGY TEMPLATE.  Microscopic Comment COLON AND RECTUM (INCLUDING TRANS-ANAL RESECTION): Specimen: Right colon and terminal ileum. Procedure: Laparoscopic assisted right partial colectomy with resection of terminal ileum. Tumor site: Proximal right colon. Specimen integrity: Intact. Macroscopic tumor perforation: Not identified. Invasive tumor: Maximum size: 9 cm. Histologic type(s): Invasive adenocarcinoma. Histologic grade and differentiation: G1: well differentiated/low grade. Type of polyp in which invasive carcinoma arose: Precursor polyp is not identified. Microscopic extension of invasive tumor: Tumor invades through muscularis propria to involve  serosa. Lymph-Vascular invasion: Definitive lymph/vascular invasion is not identified; However, several lymph nodes are positive and satellite tumor deposits are present (see below). Peri-neural invasion: Not identified. Tumor deposit(s) (discontinuous extramural extension): Yes, four extramural satellite tumor deposits are present. Resection margins: Proximal margin: Negative. Distal margin: Negative. Circumferential (radial) (posterior ascending, posterior descending; lateral and posterior mid-rectum; and entire lower 1/3 rectum): Negative.  FINAL for [place], [place] [phone])  Microscopic Comment(continued) Mesenteric margin (sigmoid and transverse): Not applicable. Distance closest margin (if all above margins negative): The primary invasive tumor is 2 cm from the radial resection margin at closest approach. Treatment effect (neo-adjuvant therapy): Not applicable. Additional polyp(s): Two additional tubular adenomas without high grade dysplasia are identified. Non-neoplastic findings: Nonspecific small bowel erosion / ulceration; fibrous obliteration of the appendix; benign omental nodule with fat necrosis, giant cell reaction, and associated foreign material. Lymph nodes: number examined - 29; number positive: 3. Pathologic Staging: pT4a, pN1b. Ancillary studies: Per colorectal tumor protocol, the tumor will be sent for MMR testing and MSI testing by PCR. (RAH:gt, 06/10/15)  Willeen Niece MD Pathologist, Electronic Signature (Case signed 06/10/2015) Specimen Gross and Clinical Information    Assessment/Plan:68 y.o. 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 CEA on 6/5 was 27.1  She is s/p  Laparoscopic assisted right partial colectomy with resection of terminal ileum. Path report revealed Invasive well differentiated adenocarcinoma , 3 of 29 lymph nodes positive, negative margins.She is  pT4a, pN1b.  The tumor will be sent for MMR testing and MSI testing by PCR. Consider CT chest to complete staging imaging studies. Dr. Burr Medico to meet with family to discuss findings and therapy options.   Anemia in neoplastic disease Due to  blood loss, dilution, history of Iron deficiency and decreased oral intake  No transfusion is indicated at this time Transfuse blood to maintain a Hb of 8 g or if the patient is acutely bleeding Please check a ferritin and iron study to ruled out iron deficient anemia  Malnutrition Appreciate Nutrition follow up  DVT prophylaxis On heparin and SCDs  Full Code   WERTMAN,SARA E, PA-C 06/11/2015   I have seen the patient, examined her. I agree with the assessment and and plan and have edited the notes.   I had a long conversation with patient, her sister, nephew (his mother is pt's sister and POA), regarding the surgical, image and pathological findings.   I discussed with her surgeon Dr. Lucia Gaskins, and also pathologist Dr. Avis Epley who signed off the case. Dr. Lucia Gaskins  did mention that he removed all visible disease, but one of the pericolonal nodule was actually in peritonea. So likely, she has stage IV colon cancer, status post complete surgical debulking.  I discussed the high risk of cancer recurrence after surgery, and the role of adjuvant chemotherapy to prevent or delay recurrence ,especially in the first 2-3 years. Given her mental retardation, advanced age, her family members prefer her not taking chemotherapy. Which I think is very reasonable.  We discussed the surveillance plan. Giving the overall palliative approach, I do not plan to routinely scan her, but will see her back in 3 months with lab test including CEA. If she develops any significant  symptoms down the road, we would consider further imaging study.  I'll arrange her follow-up appointment with me in 3 months.  Please check her ferritin, serum iron and TIBC to see if she needs iron supplement.   Thanks for the opportunity to participate in her care.  Truitt Merle  06/11/2015

## 2015-06-11 NOTE — Progress Notes (Signed)
CSW continuing to follow.   CSW was able to reach pt Group Home and pt Group Home confirmed that they can accept pt back to [place]. Per Group Home, weekend social worker to call nurse on call during weekend to facilitate pt discharge.  Weekend CSW to facilitate pt discharge over weekend if pt medically ready for discharge.  Alison Murray, MSW, LCSW Clinical Social Work Coverage for [employer], [place]

## 2015-06-11 NOTE — Progress Notes (Signed)
Patient ID: [name], female   DOB: 19-Dec-1946, 69 y.o.   MRN: [id number] 3 Days Post-Op  Subjective: Pt doing well.  No complaints.  Tolerating clear liquids.  Had 2 BMs yesterday  Objective: Vital signs in last 24 hours: Temp:  [97.7 F (36.5 C)-99 F (37.2 C)] 98.7 F (37.1 C) (06/17 0557) Pulse Rate:  [113-119] 113 (06/17 0557) Resp:  [16-18] 18 (06/17 0557) BP: (124-138)/(71-81) 124/78 mmHg (06/17 0557) SpO2:  [98 %-100 %] 98 % (06/17 0557) Weight:  [86.2 kg (190 lb 0.6 oz)] 86.2 kg (190 lb 0.6 oz) (06/17 0557) Last BM Date: 05/31/15  Intake/Output from previous day: 06/16 0701 - 06/17 0700 In: 2753 [P.O.:840; I.V.:1803; IV Piggyback:110] Out: 3750 [Urine:3750] Intake/Output this shift: Total I/O In: -  Out: 450 [Urine:450]  PE: Abd: soft, minimal BS, incisions c/d/i, ND, minimally tender  Lab Results:   Recent Labs  06/10/15 0510 06/11/15 0450  WBC 6.8 5.5  HGB 9.8* 9.9*  HCT 31.4* 31.2*  PLT 337 365   BMET  Recent Labs  06/10/15 0510 06/11/15 0450  NA 137 137  K 4.2 4.5  CL 108 108  CO2 23 22  GLUCOSE 125* 124*  BUN <5* <5*  CREATININE 0.42* 0.42*  CALCIUM 8.6* 8.8*   PT/INR No results for input(s): LABPROT, INR in the last 72 hours. CMP     Component Value Date/Time   NA 137 06/11/2015 0450   K 4.5 06/11/2015 0450   CL 108 06/11/2015 0450   CO2 22 06/11/2015 0450   GLUCOSE 124* 06/11/2015 0450   BUN <5* 06/11/2015 0450   CREATININE 0.42* 06/11/2015 0450   CALCIUM 8.8* 06/11/2015 0450   PROT 7.6 05/30/2015 1211   ALBUMIN 3.4* 05/30/2015 1211   AST 30 05/30/2015 1211   ALT 17 05/30/2015 1211   ALKPHOS 64 05/30/2015 1211   BILITOT <0.1* 05/30/2015 1211   GFRNONAA >60 06/11/2015 0450   GFRAA >60 06/11/2015 0450   Lipase     Component Value Date/Time   LIPASE 11* 05/30/2015 1211       Studies/Results: No results found.  Anti-infectives: Anti-infectives    Start     Dose/Rate Route Frequency Ordered Stop   06/08/15 0600   cefoTEtan (CEFOTAN) 2 g in dextrose 5 % 50 mL IVPB     2 g 100 mL/hr over 30 Minutes Intravenous On call to O.R. 06/07/15 1513 06/08/15 1300   06/07/15 1600  metroNIDAZOLE (FLAGYL) tablet 1,000 mg     1,000 mg Oral 3 times per day on Mon 06/07/15 1513 06/07/15 2335   06/07/15 1600  neomycin (MYCIFRADIN) tablet 1,000 mg     1,000 mg Oral 3 times per day on Mon 06/07/15 1513 06/07/15 2334   05/31/15 0800  cefTRIAXone (ROCEPHIN) 1 g in dextrose 5 % 50 mL IVPB - Premix  Status:  Discontinued     1 g 100 mL/hr over 30 Minutes Intravenous Every 24 hours 05/31/15 0742 06/04/15 1143       Assessment/Plan   POD 3, s/p lap assisted extended right colectomy with TI for adenocarcinoma --> 06/08/2015 - Dr. Lucia Gaskins -advance to full liquids -mobilize and pulm toilet -Pathology: pT4a, pN1b  INVASIVE WELL DIFFERENTIATED ADENOCARCINOMA WITH ABUNDANT EXTRACELLULAR MUCIN, MEASURING 9 CM IN GREATEST DIMENSION.  - TUMOR INVADES THROUGH UNDERLYING MUSCULARIS MUCOSA TO INVOLVE SEROSAL SURFACE.  - FOUR EXTRAMURAL SATELLITE TUMOR DEPOSITS ARE IDENTIFIED.  - MARGINS ARE NEGATIVE.  - THREE OF TWENTY-NINE LYMPH NODES ARE POSITIVE FOR METASTATIC  CARCINOMA (3/29).  SBO -resolved DVT proph -heparin and scds  LOS: 12 days    OSBORNE,KELLY E 06/11/2015, 9:34 AM Pager: [phone]  Agree with above. Continues to do well. Seen by Dr. Burr Medico from oncology.  I agree with her assessment. Her CEA was elevated preop, it will be interesting to see how it does post op.  I thought that there was no gross cancer left after surgery.  But she is at very high risk for recurrent cancer.  Alphonsa Overall, MD, [facility] Surgery Pager: [phone] Office phone:  [phone]

## 2015-06-11 NOTE — Care Management Note (Signed)
Case Management Note  Patient Details  Name: [name] MRN: [id number] Date of Birth: 12/11/1946  Subjective/Objective:            Admitted with bowel obstruction, s/p colectomy        Action/Plan: Discharge planning, [facility] working with group home, SNF if unable to return. If returns to group home, recommendations for PT/OT, provided sister and group home representative with [facility] list for choice. Will await final decision from group home.   Expected Discharge Date:   (unknown)               Expected Discharge Plan:  Group Home  In-House Referral:  Clinical Social Work  Discharge planning Services  CM Consult  Post Acute Care Choice:    Choice offered to:  Sibling  DME Arranged:    DME Agency:     HH Arranged:    [employer] Agency:     Status of Service:  In process, will continue to follow  Medicare Important Message Given:  Yes Date Medicare IM Given:  06/03/15 Medicare IM give by:  Leanne Chang Date Additional Medicare IM Given:  06/11/15 Additional Medicare Important Message give by:  Sunday Spillers RN CM   If discussed at Long Length of Stay Meetings, dates discussed:    Additional Comments:  Guadalupe Maple, RN 06/11/2015, 2:28 PM

## 2015-06-11 NOTE — Progress Notes (Signed)
[facility] PROGRESS NOTE  [name] BDZ:[id number] DOB: 02/13/1946 DOA: 05/30/2015 PCP: Pcp Not In System  Brief narrative 69 year old female with hyperlipidemia, osteoporosis, prior history of small bowel obstruction, constipation was brought to ED with main concern of several days duration of progressively worsening abd pain, per family member described as constant and throbbing, unknown severity, associated with poor oral intake, nausea and non bloody vomiting.   CT abdomen and pelvis showed recurrent high-grade mid to distal small bowel obstruction with transition point in the periumbilical region, infiltrating mass involving the right colon highly suspicious for colon cancer, at bedtime and prominent mesenteric lymph nodes and/or peritoneal nodules concerning for metastatic disease.   Major events since admission: 6/08 - NG tube placed 6/09 - slight improvement, electrolyte still depleted, needs more potassium 6/10 - no significant improvement in SBP per imaging studies but overall pt is stable  6/13 - tachy, slightly prolonged QT interval on tele, colonoscopy results below, NGT out  6/14 - Laparoscopic-assisted right partial colectomy with resection of terminal ileum. Per Dr. Lucia Gaskins   Assessment/Plan: #1 high-grade small bowel obstruction Status post NG tube placed 06/02/2015 and removed 06/07/2015. Patient with no further nausea vomiting or abdominal pain. Patient passing gas. Patient with bowel movements this morning. Replete electrolytes. Patient has been seen by general surgery and patient diet has been advanced to full liquids. Follow.  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 Patient status post laparoscopic assisted right partial colectomy with resection of terminal ileum 06/08/2015 per Dr. Lucia Gaskins. Patient's diet has been advanced to a full liquid diet. Pathology consistent with adenocarcinoma.  Per surgical note patient with multiple prior to new nodules noted during surgery likely metastatic disease. Patient has been seen in consultation by oncology and patient due to mental retardation and discussion with family not a chemotherapy candidate. Patient will follow-up with oncology as outpatient for surveillance.   #3 mental retardation At baseline.  #4 hyperlipidemia Medications on hold for now.  #5 tachycardia  Improved. Follow.  #6 leukocytosis Likely reactive leukocytosis postoperatively. Patient with no respiratory symptoms. WBC trending down. Will follow for now.  #7 hypertension Increase IV Lopressor 5 mg every 8 hours. Hydralazine as needed.  #8 bacteriuria Urine culture showed only 35,000 colonies. Patient completed 5 days of Rocephin.  #9 history of seizures Stable. Patient with no seizures. Continue IV Keppra. When tolerating oral intake will resume home regimen, hopefully tomorrow.  #10 severe protein calorie malnutrition Will start patient on ensure supplementation.   #11 obesity  #12 hypokalemia/hypomagnesemia Repleted.  #13 prophylaxis Heparin for DVT prophylaxis.   Code Status: Full Family Communication: Updated patient and sister at bedside. Disposition Plan: Back to group home with home health therapy when medically stable.   Consultants:  Gastroenterology: Dr. Fuller Plan 05/31/2015  Gen. surgery: Dr. Harlow Asa 05/31/2015  Oncology: Dr. Marlowe Kays 06/11/2015  Procedures:  Chest x-ray 06/08/2015  Abdominal films 06/07/2015, 06/05/2015, 06/04/2015, 06/03/2015  Right IJ catheter 06/08/2015  CT abdomen and pelvis 05/30/2015  Laparoscopic assisted right partial colectomy with resection of terminal ileum 06/08/2015 Dr. Lucia Gaskins  Colonoscopy 06/07/2015 per Dr. Ardis Hughs There was a non-circumferential, clearly malignant mass in the ascending colon (just distal to the IC valve), measured about 6-7 cm long. The mass was not obstructive. Multiple biopsies  were taken. The examination was otherwise normal  Antibiotics:  IV Rocephin 05/31/2015>>>> 06/04/2015  IV Cefotan 06/08/2015 on call to or>>>> 06/08/2015  Oral Flagyl 06/07/2015>>>> 06/07/2015  HPI/Subjective: Patient tolerating current diet. No abdominal pain. No nausea. No vomiting. The patient insisted patient with bowel movement today.  Objective: Filed Vitals:   06/11/15 1410  BP: 130/72  Pulse: 121  Temp: 97.6 F (36.4 C)  Resp: 16    Intake/Output Summary (Last 24 hours) at 06/11/15 1423 Last data filed at 06/11/15 1412  Gross per 24 hour  Intake   2378 ml  Output   2925 ml  Net   -547 ml   Filed Weights   06/09/15 0400 06/10/15 0547 06/11/15 0557  Weight: 90.7 kg (199 lb 15.3 oz) 85.911 kg (189 lb 6.4 oz) 86.2 kg (190 lb 0.6 oz)    Exam:   General:  NAD  Cardiovascular: RRR  Respiratory: CTAB  Abdomen: Soft/NT/ND/+ BS.   Musculoskeletal: No clubbing cyanosis or edema.  Data Reviewed: Basic Metabolic Panel:  Recent Labs Lab 06/06/15 0510 06/07/15 0445 06/08/15 0451 06/09/15 0600 06/10/15 0510 06/11/15 0450  NA 140 138 136 137 137 137  K 4.3 4.2 3.6 3.8 4.2 4.5  CL 103 105 103 104 108 108  CO2 24 22 26 24 23 22   GLUCOSE 102* 103* 126* 167* 125* 124*  BUN <5* <5* <5* <5* <5* <5*  CREATININE 0.54 0.48 0.48 0.42* 0.42* 0.42*  CALCIUM 8.8* 8.6* 8.9 8.4* 8.6* 8.8*  MG 1.8 1.8 1.7 1.5* 1.8  --    Liver Function Tests: No results for input(s): AST, ALT, ALKPHOS, BILITOT, PROT, ALBUMIN in the last 168 hours. No results for input(s): LIPASE, AMYLASE in the last 168 hours. No results for input(s): AMMONIA in the last 168 hours. CBC:  Recent Labs Lab 06/06/15 0510 06/08/15 0451 06/09/15 0600 06/10/15 0510 06/11/15 0450  WBC 7.2 9.1 11.5* 6.8 5.5  HGB 10.2* 10.2* 9.2* 9.8* 9.9*  HCT 31.5* 31.9* 28.6* 31.4* 31.2*  MCV 86.8 86.9 85.4 85.8 86.9  PLT 348 343 307 337 365   Cardiac Enzymes: No results for input(s): CKTOTAL, CKMB, CKMBINDEX,  TROPONINI in the last 168 hours. BNP (last 3 results) No results for input(s): BNP in the last 8760 hours.  ProBNP (last 3 results) No results for input(s): PROBNP in the last 8760 hours.  CBG: No results for input(s): GLUCAP in the last 168 hours.  Recent Results (from the past 240 hour(s))  Clostridium Difficile by PCR (not at [facility])     Status: None   Collection Time: 06/01/15  5:05 PM  Result Value Ref Range Status   C difficile by pcr NEGATIVE NEGATIVE Final  Surgical pcr screen     Status: None   Collection Time: 06/08/15  8:24 AM  Result Value Ref Range Status   MRSA, PCR NEGATIVE NEGATIVE Final   Staphylococcus aureus NEGATIVE NEGATIVE Final    Comment:        The Xpert SA Assay (FDA approved for NASAL specimens in patients over 75 years of age), is one component of a comprehensive surveillance program.  Test performance has been validated by [facility] for patients greater than or equal to 78 year old. It is not intended to diagnose infection nor to guide or monitor treatment.      Studies: No results found.  Scheduled Meds: . heparin subcutaneous  5,000 Units Subcutaneous 3 times per day  . levETIRAcetam  1,000 mg Intravenous Q12H  . metoprolol  2.5 mg Intravenous 3 times per day  . sodium chloride  3 mL Intravenous Q12H   Continuous Infusions:    Principal Problem:  SBO (small bowel obstruction) Active Problems:   Mental retardation   Convulsions   HLD (hyperlipidemia)   Colonic mass   Small bowel obstruction   UTI (lower urinary tract infection)   Protein-calorie malnutrition, severe   Colon cancer   Seizures   Essential hypertension   Adenocarcinoma of colon    Time spent: [place] MD [facility] Pager [phone]. If 7PM-7AM, please contact night-coverage at [email or web address], password [facility] 06/11/2015, 2:23 PM  LOS: 12 days

## 2015-06-12 DIAGNOSIS — N390 Urinary tract infection, site not specified: Secondary | ICD-10-CM

## 2015-06-12 LAB — BASIC METABOLIC PANEL
ANION GAP: 5 (ref 5–15)
BUN: 6 mg/dL (ref 6–20)
CALCIUM: 8.6 mg/dL — AB (ref 8.9–10.3)
CO2: 24 mmol/L (ref 22–32)
Chloride: 108 mmol/L (ref 101–111)
Creatinine, Ser: 0.53 mg/dL (ref 0.44–1.00)
Glucose, Bld: 118 mg/dL — ABNORMAL HIGH (ref 65–99)
Potassium: 3.8 mmol/L (ref 3.5–5.1)
SODIUM: 137 mmol/L (ref 135–145)

## 2015-06-12 LAB — CBC
HCT: 28.6 % — ABNORMAL LOW (ref 36.0–46.0)
Hemoglobin: 9.2 g/dL — ABNORMAL LOW (ref 12.0–15.0)
MCH: 27.9 pg (ref 26.0–34.0)
MCHC: 32.2 g/dL (ref 30.0–36.0)
MCV: 86.7 fL (ref 78.0–100.0)
Platelets: 334 10*3/uL (ref 150–400)
RBC: 3.3 MIL/uL — ABNORMAL LOW (ref 3.87–5.11)
RDW: 13.5 % (ref 11.5–15.5)
WBC: 6.5 10*3/uL (ref 4.0–10.5)

## 2015-06-12 LAB — MAGNESIUM: MAGNESIUM: 1.7 mg/dL (ref 1.7–2.4)

## 2015-06-12 MED ORDER — FELBAMATE 600 MG/5ML PO SUSP
1200.0000 mg | Freq: Two times a day (BID) | ORAL | Status: DC
  Administered 2015-06-12: 1200 mg via ORAL
  Filled 2015-06-12 (×2): qty 10

## 2015-06-12 MED ORDER — DOCUSATE SODIUM 100 MG PO CAPS: 100.0000 mg | ORAL_CAPSULE | Freq: Two times a day (BID) | ORAL | Status: DC

## 2015-06-12 MED ORDER — LEVETIRACETAM 500 MG PO TABS
1000.0000 mg | ORAL_TABLET | Freq: Two times a day (BID) | ORAL | Status: DC
  Administered 2015-06-12 – 2015-06-14 (×4): 1000 mg via ORAL
  Filled 2015-06-12 (×6): qty 2

## 2015-06-12 MED ORDER — FELBAMATE 600 MG PO TABS: 1200.0000 mg | ORAL_TABLET | Freq: Two times a day (BID) | ORAL | Status: DC

## 2015-06-12 MED ORDER — MAGNESIUM SULFATE 50 % IJ SOLN
3.0000 g | Freq: Once | INTRAMUSCULAR | Status: AC
  Administered 2015-06-12: 3 g via INTRAVENOUS
  Filled 2015-06-12: qty 6

## 2015-06-12 MED ORDER — ACETAMINOPHEN 325 MG PO TABS: 650.0000 mg | ORAL_TABLET | ORAL | Status: DC | PRN

## 2015-06-12 MED ORDER — HYDROCODONE-ACETAMINOPHEN 5-325 MG PO TABS
1.0000 | ORAL_TABLET | ORAL | Status: DC | PRN
  Administered 2015-06-12 – 2015-06-14 (×5): 1 via ORAL
  Filled 2015-06-12 (×5): qty 1

## 2015-06-12 MED ORDER — METOPROLOL TARTRATE 12.5 MG HALF TABLET
12.5000 mg | ORAL_TABLET | Freq: Two times a day (BID) | ORAL | Status: DC
  Administered 2015-06-12 – 2015-06-14 (×5): 12.5 mg via ORAL
  Filled 2015-06-12 (×6): qty 1

## 2015-06-12 MED ORDER — SODIUM CHLORIDE 0.9 % IV SOLN
510.0000 mg | Freq: Once | INTRAVENOUS | Status: AC
  Administered 2015-06-12: 510 mg via INTRAVENOUS
  Filled 2015-06-12: qty 17

## 2015-06-12 NOTE — Progress Notes (Signed)
Patient ID: Kenyon Ana, female   DOB: 06/19/1946, 69 y.o.   MRN: [id number]  [facility]  POD#: 4  Subjective: Patient without complaint.  States having loose BM's.  Hungry - wants regular diet.  Objective: Vital signs in last 24 hours: Temp:  [97.6 F (36.4 C)-98.4 F (36.9 C)] 98.2 F (36.8 C) (06/18 0555) Pulse Rate:  [103-128] 103 (06/18 0555) Resp:  [16] 16 (06/18 0555) BP: (124-150)/(61-78) 124/61 mmHg (06/18 0555) SpO2:  [97 %-100 %] 98 % (06/18 0555) Weight:  [83.416 kg (183 lb 14.4 oz)] 83.416 kg (183 lb 14.4 oz) (06/18 0500) Last BM Date: 06/11/15  Intake/Output from previous day: 06/17 0701 - 06/18 0700 In: 1758 [P.O.:960; I.V.:688; IV Piggyback:110] Out: 2775 [Urine:2775] Intake/Output this shift:    Physical Exam: HEENT - sclerae clear, mucous membranes moist Neck - soft Chest - clear bilaterally Cor - RRR Abdomen - soft, mild distension; BS active; wound dry and intact with dressing in place Ext - no edema, non-tender  Lab Results:   Recent Labs  06/11/15 0450 06/12/15 0345  WBC 5.5 6.5  HGB 9.9* 9.2*  HCT 31.2* 28.6*  PLT 365 334   BMET  Recent Labs  06/11/15 0450 06/12/15 0345  NA 137 137  K 4.5 3.8  CL 108 108  CO2 22 24  GLUCOSE 124* 118*  BUN <5* 6  CREATININE 0.42* 0.53  CALCIUM 8.8* 8.6*   PT/INR No results for input(s): LABPROT, INR in the last 72 hours. Comprehensive Metabolic Panel:    Component Value Date/Time   NA 137 06/12/2015 0345   NA 137 06/11/2015 0450   K 3.8 06/12/2015 0345   K 4.5 06/11/2015 0450   CL 108 06/12/2015 0345   CL 108 06/11/2015 0450   CO2 24 06/12/2015 0345   CO2 22 06/11/2015 0450   BUN 6 06/12/2015 0345   BUN <5* 06/11/2015 0450   CREATININE 0.53 06/12/2015 0345   CREATININE 0.42* 06/11/2015 0450   GLUCOSE 118* 06/12/2015 0345   GLUCOSE 124* 06/11/2015 0450   CALCIUM 8.6* 06/12/2015 0345   CALCIUM 8.8* 06/11/2015 0450   AST 30 05/30/2015 1211   AST 19 12/10/2013 1835   ALT 17 05/30/2015 1211   ALT 15 12/10/2013 1835   ALKPHOS 64 05/30/2015 1211   ALKPHOS 52 12/10/2013 1835   BILITOT <0.1* 05/30/2015 1211   BILITOT 0.3 12/10/2013 1835   PROT 7.6 05/30/2015 1211   PROT 7.3 12/10/2013 1835   ALBUMIN 3.4* 05/30/2015 1211   ALBUMIN 3.4* 12/10/2013 1835    Studies/Results: No results found.  Anti-infectives: Anti-infectives    Start     Dose/Rate Route Frequency Ordered Stop   06/08/15 0600  cefoTEtan (CEFOTAN) 2 g in dextrose 5 % 50 mL IVPB     2 g 100 mL/hr over 30 Minutes Intravenous On call to O.R. 06/07/15 1513 06/08/15 1300   06/07/15 1600  metroNIDAZOLE (FLAGYL) tablet 1,000 mg     1,000 mg Oral 3 times per day on Mon 06/07/15 1513 06/07/15 2335   06/07/15 1600  neomycin (MYCIFRADIN) tablet 1,000 mg     1,000 mg Oral 3 times per day on Mon 06/07/15 1513 06/07/15 2334   05/31/15 0800  cefTRIAXone (ROCEPHIN) 1 g in dextrose 5 % 50 mL IVPB - Premix  Status:  Discontinued     1 g 100 mL/hr over 30 Minutes Intravenous Every 24 hours 05/31/15 0742 06/04/15 1143      Assessment &  Plans: s/p lap assisted extended right colectomy  Advance to regular diet  PO pain Rx  OOB, ambulate  Earnstine Regal, MD, [facility] Office: [place] 06/12/2015

## 2015-06-12 NOTE — Progress Notes (Signed)
[facility] PROGRESS NOTE  [name] BZJ:[id number] DOB: 14-May-1946 DOA: 05/30/2015 PCP: Pcp Not In System  Brief narrative 69 year old female with hyperlipidemia, osteoporosis, prior history of small bowel obstruction, constipation was brought to ED with main concern of several days duration of progressively worsening abd pain, per family member described as constant and throbbing, unknown severity, associated with poor oral intake, nausea and non bloody vomiting.   CT abdomen and pelvis showed recurrent high-grade mid to distal small bowel obstruction with transition point in the periumbilical region, infiltrating mass involving the right colon highly suspicious for colon cancer, at bedtime and prominent mesenteric lymph nodes and/or peritoneal nodules concerning for metastatic disease.   Major events since admission: 6/08 - NG tube placed 6/09 - slight improvement, electrolyte still depleted, needs more potassium 6/10 - no significant improvement in SBP per imaging studies but overall pt is stable  6/13 - tachy, slightly prolonged QT interval on tele, colonoscopy results below, NGT out  6/14 - Laparoscopic-assisted right partial colectomy with resection of terminal ileum. Per Dr. Lucia Gaskins   Assessment/Plan: #1 high-grade small bowel obstruction Status post NG tube placed 06/02/2015 and removed 06/07/2015. Patient with no further nausea vomiting or abdominal pain. Patient passing gas. Patient with bowel movements this morning. Replete electrolytes. Patient has been seen by general surgery and patient diet has been advanced to regular diet. Follow.  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 Patient status post laparoscopic assisted right partial colectomy with resection of terminal ileum 06/08/2015 per Dr. Lucia Gaskins. Patient's diet has been advanced to a regular diet. Pathology consistent with adenocarcinoma. Per  surgical note patient with multiple prior to new nodules noted during surgery likely metastatic disease. Patient has been seen in consultation by oncology and patient due to mental retardation and discussion with family not a chemotherapy candidate. Patient will follow-up with oncology as outpatient for surveillance.   #3 mental retardation At baseline.  #4 hyperlipidemia Medications on hold for now.  #5 tachycardia  Improved. Follow.  #6 leukocytosis Likely reactive leukocytosis postoperatively. Patient with no respiratory symptoms. WBC trending down. Will follow for now.  #7 hypertension Increase IV Lopressor 5 mg every 8 hours. Hydralazine as needed.  #8 bacteriuria Urine culture showed only 35,000 colonies. Patient completed 5 days of Rocephin.  #9 history of seizures Stable. Patient with no seizures. Patient refusing liquid version of home antiseizure medications secondary to taste. Will change antiseizure medications to oral Keppra while in-house and resume her home regimen on discharge.   #10 severe protein calorie malnutrition Continue ensure supplementation.   #11 obesity  #12 hypokalemia/hypomagnesemia Repleted.  #13 prophylaxis Heparin for DVT prophylaxis.   Code Status: Full Family Communication: Updated patient and sister at bedside. Disposition Plan: Back to group home with home health therapy when medically stable.   Consultants:  Gastroenterology: Dr. Fuller Plan 05/31/2015  Gen. surgery: Dr. Harlow Asa 05/31/2015  Oncology: Dr. Marlowe Kays 06/11/2015  Procedures:  Chest x-ray 06/08/2015  Abdominal films 06/07/2015, 06/05/2015, 06/04/2015, 06/03/2015  Right IJ catheter 06/08/2015  CT abdomen and pelvis 05/30/2015  Laparoscopic assisted right partial colectomy with resection of terminal ileum 06/08/2015 Dr. Lucia Gaskins  Colonoscopy 06/07/2015 per Dr. Ardis Hughs There was a non-circumferential, clearly malignant mass in the ascending colon (just distal to the IC  valve), measured about 6-7 cm long. The mass was not obstructive. Multiple biopsies were taken. The examination was otherwise normal  Antibiotics:  IV Rocephin 05/31/2015>>>> 06/04/2015  IV Cefotan 06/08/2015  on call to or>>>> 06/08/2015  Oral Flagyl 06/07/2015>>>> 06/07/2015  HPI/Subjective: Patient tolerating current diet. No abdominal pain. No nausea. No vomiting. Patient quit bowel movement and passing flatus. Patient refusing liquid version of her home anti-seizure medications due to taste.   Objective: Filed Vitals:   06/12/15 1400  BP: 132/75  Pulse: 108  Temp: 97.6 F (36.4 C)  Resp: 18    Intake/Output Summary (Last 24 hours) at 06/12/15 1425 Last data filed at 06/12/15 1400  Gross per 24 hour  Intake   1218 ml  Output   2300 ml  Net  -1082 ml   Filed Weights   06/10/15 0547 06/11/15 0557 06/12/15 0500  Weight: 85.911 kg (189 lb 6.4 oz) 86.2 kg (190 lb 0.6 oz) 83.416 kg (183 lb 14.4 oz)    Exam:   General:  NAD  Cardiovascular: RRR  Respiratory: CTAB  Abdomen: Soft/NT/ND/+ BS.   Musculoskeletal: No clubbing cyanosis or edema.  Data Reviewed: Basic Metabolic Panel:  Recent Labs Lab 06/07/15 0445 06/08/15 0451 06/09/15 0600 06/10/15 0510 06/11/15 0450 06/12/15 0345  NA 138 136 137 137 137 137  K 4.2 3.6 3.8 4.2 4.5 3.8  CL 105 103 104 108 108 108  CO2 22 26 24 23 22 24   GLUCOSE 103* 126* 167* 125* 124* 118*  BUN <5* <5* <5* <5* <5* 6  CREATININE 0.48 0.48 0.42* 0.42* 0.42* 0.53  CALCIUM 8.6* 8.9 8.4* 8.6* 8.8* 8.6*  MG 1.8 1.7 1.5* 1.8  --  1.7   Liver Function Tests: No results for input(s): AST, ALT, ALKPHOS, BILITOT, PROT, ALBUMIN in the last 168 hours. No results for input(s): LIPASE, AMYLASE in the last 168 hours. No results for input(s): AMMONIA in the last 168 hours. CBC:  Recent Labs Lab 06/08/15 0451 06/09/15 0600 06/10/15 0510 06/11/15 0450 06/12/15 0345  WBC 9.1 11.5* 6.8 5.5 6.5  HGB 10.2* 9.2* 9.8* 9.9* 9.2*   HCT 31.9* 28.6* 31.4* 31.2* 28.6*  MCV 86.9 85.4 85.8 86.9 86.7  PLT 343 307 337 365 334   Cardiac Enzymes: No results for input(s): CKTOTAL, CKMB, CKMBINDEX, TROPONINI in the last 168 hours. BNP (last 3 results) No results for input(s): BNP in the last 8760 hours.  ProBNP (last 3 results) No results for input(s): PROBNP in the last 8760 hours.  CBG: No results for input(s): GLUCAP in the last 168 hours.  Recent Results (from the past 240 hour(s))  Surgical pcr screen     Status: None   Collection Time: 06/08/15  8:24 AM  Result Value Ref Range Status   MRSA, PCR NEGATIVE NEGATIVE Final   Staphylococcus aureus NEGATIVE NEGATIVE Final    Comment:        The Xpert SA Assay (FDA approved for NASAL specimens in patients over 50 years of age), is one component of a comprehensive surveillance program.  Test performance has been validated by [facility] for patients greater than or equal to 23 year old. It is not intended to diagnose infection nor to guide or monitor treatment.      Studies: No results found.  Scheduled Meds: . feeding supplement (ENSURE ENLIVE)  237 mL Oral TID BM  . felbamate  1,200 mg Oral Q12H  . heparin subcutaneous  5,000 Units Subcutaneous 3 times per day  . magnesium sulfate 1 - 4 g bolus IVPB  3 g Intravenous Once  . metoprolol tartrate  12.5 mg Oral BID  . sodium chloride  10-40 mL Intracatheter Q12H  .  sodium chloride  3 mL Intravenous Q12H   Continuous Infusions:    Principal Problem:   SBO (small bowel obstruction) Active Problems:   Mental retardation   Convulsions   HLD (hyperlipidemia)   Colonic mass   Small bowel obstruction   UTI (lower urinary tract infection)   Protein-calorie malnutrition, severe   Colon cancer   Seizures   Essential hypertension   Adenocarcinoma of colon    Time spent: [place] MD [facility] Pager [phone]. If 7PM-7AM, please contact night-coverage at [email or web address],  password [facility] 06/12/2015, 2:25 PM  LOS: 13 days

## 2015-06-12 NOTE — Progress Notes (Signed)
Pt has refused CPAP for tonight.  Pt to call if she decides she wants to wear.  RT to monitor and assess as needed.

## 2015-06-12 NOTE — Progress Notes (Signed)
MEDICATION RELATED CONSULT NOTE - INITIAL   Pharmacy Consult for IV iron  Indication: anemia/low iron  No Known Allergies  Patient Measurements: Height: 5\' 4"  (162.6 cm) (per chart) Weight: 183 lb 14.4 oz (83.416 kg) IBW/kg (Calculated) : 54.7 Adjusted Body Weight:   Vital Signs: Temp: 98.2 F (36.8 C) (06/18 0555) Temp Source: Oral (06/18 0555) BP: 124/61 mmHg (06/18 0555) Pulse Rate: 103 (06/18 0555) Intake/Output from previous day: 06/17 0701 - 06/18 0700 In: 1758 [P.O.:960; I.V.:688; IV Piggyback:110] Out: 2775 [Urine:2775] Intake/Output from this shift:    Labs:  Recent Labs  06/10/15 0510 06/11/15 0450 06/12/15 0345  WBC 6.8 5.5 6.5  HGB 9.8* 9.9* 9.2*  HCT 31.4* 31.2* 28.6*  PLT 337 365 334  CREATININE 0.42* 0.42* 0.53  MG 1.8  --   --    Estimated Creatinine Clearance: 68.4 mL/min (by C-G formula based on Cr of 0.53).   Microbiology: Recent Results (from the past 720 hour(s))  Urine culture     Status: None   Collection Time: 05/30/15 11:35 AM  Result Value Ref Range Status   Specimen Description URINE, CLEAN CATCH  Final   Special Requests NONE  Final   Colony Count   Final    35,000 COLONIES/ML Performed at [facility]    Culture   Final    Multiple bacterial morphotypes present, none predominant. Suggest appropriate recollection if clinically indicated. Performed at [facility]    Report Status 05/31/2015 FINAL  Final  Urine culture     Status: None   Collection Time: 05/31/15  3:27 AM  Result Value Ref Range Status   Specimen Description URINE, RANDOM  Final   Special Requests NONE  Final   Colony Count NO GROWTH Performed at [facility]   Final   Culture NO GROWTH Performed at [facility]   Final   Report Status 06/01/2015 FINAL  Final  Clostridium Difficile by PCR (not at [facility])     Status: None   Collection Time: 06/01/15  5:05 PM  Result Value Ref Range Status   C difficile by pcr NEGATIVE  NEGATIVE Final  Surgical pcr screen     Status: None   Collection Time: 06/08/15  8:24 AM  Result Value Ref Range Status   MRSA, PCR NEGATIVE NEGATIVE Final   Staphylococcus aureus NEGATIVE NEGATIVE Final    Comment:        The Xpert SA Assay (FDA approved for NASAL specimens in patients over 35 years of age), is one component of a comprehensive surveillance program.  Test performance has been validated by [facility] for patients greater than or equal to 59 year old. It is not intended to diagnose infection nor to guide or monitor treatment.     Medical History: Past Medical History  Diagnosis Date  . Hypercholesteremia   . Constipation   . Osteoporosis   . Seizure   . Edema   . Mental retardation   . SBO (small bowel obstruction) 11/2013, 05/2015    likely due to adhesions  . Colonic mass 05/2015    right, concerning for cancer    Assessment: 69 YOF presented with high-grade SBO d/t mass and concern for colon cancer.  6/14 s/p R partial colectomy w/ resection terminal ileum.  Iron panel check with labs as below.  Pharmacy asked to dose iron.   Iron/TIBC/Ferritin/ %Sat    Component Value Date/Time   IRON 18* 06/11/2015 1520   TIBC 274 06/11/2015 1520  FERRITIN 42 06/11/2015 1520   IRONPCTSAT 7* 06/11/2015 1520    Goal of Therapy:  Iron repletion  Plan:   Feraheme 510mg  IVPB x 1 dose  Suggest start oral iron supplement for continued repletion and maintenance  Doreene Eland, PharmD, BCPS.   Pager: [phone] 06/12/2015,8:59 AM

## 2015-06-13 LAB — CBC
HCT: 28.2 % — ABNORMAL LOW (ref 36.0–46.0)
HEMOGLOBIN: 9.1 g/dL — AB (ref 12.0–15.0)
MCH: 28 pg (ref 26.0–34.0)
MCHC: 32.3 g/dL (ref 30.0–36.0)
MCV: 86.8 fL (ref 78.0–100.0)
Platelets: 358 10*3/uL (ref 150–400)
RBC: 3.25 MIL/uL — ABNORMAL LOW (ref 3.87–5.11)
RDW: 13.7 % (ref 11.5–15.5)
WBC: 6.6 10*3/uL (ref 4.0–10.5)

## 2015-06-13 LAB — BASIC METABOLIC PANEL
Anion gap: 7 (ref 5–15)
BUN: 14 mg/dL (ref 6–20)
CO2: 26 mmol/L (ref 22–32)
CREATININE: 0.47 mg/dL (ref 0.44–1.00)
Calcium: 8.6 mg/dL — ABNORMAL LOW (ref 8.9–10.3)
Chloride: 103 mmol/L (ref 101–111)
Glucose, Bld: 113 mg/dL — ABNORMAL HIGH (ref 65–99)
Potassium: 4 mmol/L (ref 3.5–5.1)
Sodium: 136 mmol/L (ref 135–145)

## 2015-06-13 MED ORDER — METOPROLOL TARTRATE 25 MG PO TABS: 12.5000 mg | ORAL_TABLET | Freq: Two times a day (BID) | ORAL | Status: DC

## 2015-06-13 MED ORDER — ENSURE ENLIVE PO LIQD: 237.0000 mL | Freq: Three times a day (TID) | ORAL | Status: DC

## 2015-06-13 NOTE — Clinical Social Work Note (Signed)
CSW received a call from MD inquiring about discharge  CSW called and spoke with pt's group home [facility] ([facility] and their on call RN (Tavana) to assess for discharge  Pt's group home stated that they needed to know what level of care pt needs and also stated that due to their funding they cannot have PT services in the group home and pt would need to go to outpatient services  CSW reviewed PT notes which reflected the following recommendations:  SNF;Home health PT;No PT follow up (preferably D/C back to her Group Home (familiar environment))  CSW called and spoke with pt's MD to assess which level of care pt needed as PT evaluation listed 3 different levels of care.  MD wants PT to get with pt again to assess which level of care pt needs and stated that pt's family would like her to go back to group home.  CSW called and spoke with RN/case manager who was in pt's room with pt's sister and the sister stated that a meeting had already taken place with the group home and they had stated they could provide in home PT services for pt in the group home and that is what pt and her family want.  Since there is conflicting information RN notified family that MD has asked PT  to get with pt and assess for clearer level of care recommendation and pt may be discharged tomorrow  CSW will continue to follow pt until discharge  .Dede Query, LCSW [facility] Clinical Social Worker - Weekend Coverage cell #: [phone]

## 2015-06-13 NOTE — Progress Notes (Signed)
[facility] PROGRESS NOTE  [name] YWV:[id number] DOB: 07/07/1946 DOA: 05/30/2015 PCP: Pcp Not In System  Brief narrative 69 year old female with hyperlipidemia, osteoporosis, prior history of small bowel obstruction, constipation was brought to ED with main concern of several days duration of progressively worsening abd pain, per family member described as constant and throbbing, unknown severity, associated with poor oral intake, nausea and non bloody vomiting.   CT abdomen and pelvis showed recurrent high-grade mid to distal small bowel obstruction with transition point in the periumbilical region, infiltrating mass involving the right colon highly suspicious for colon cancer, at bedtime and prominent mesenteric lymph nodes and/or peritoneal nodules concerning for metastatic disease.   Major events since admission: 6/08 - NG tube placed 6/09 - slight improvement, electrolyte still depleted, needs more potassium 6/10 - no significant improvement in SBP per imaging studies but overall pt is stable  6/13 - tachy, slightly prolonged QT interval on tele, colonoscopy results below, NGT out  6/14 - Laparoscopic-assisted right partial colectomy with resection of terminal ileum. Per Dr. Lucia Gaskins   Assessment/Plan: #1 high-grade small bowel obstruction Status post NG tube placed 06/02/2015 and removed 06/07/2015. Patient with no further nausea vomiting or abdominal pain. Patient passing gas. Patient with bowel movements this morning. Replete electrolytes. Patient has been seen by general surgery and patient diet has been advanced to regular diet. Patient tolerating current diet. Follow.  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 Patient status post laparoscopic assisted right partial colectomy with resection of terminal ileum 06/08/2015 per Dr. Lucia Gaskins. Patient's diet has been advanced to a regular diet. Pathology  consistent with adenocarcinoma. Per surgical note patient with multiple prior to new nodules noted during surgery likely metastatic disease. Patient has been seen in consultation by oncology and patient due to mental retardation and discussion with family not a chemotherapy candidate. Patient will follow-up with oncology as outpatient for surveillance.   #3 mental retardation At baseline.  #4 hyperlipidemia Medications on hold for now.  #5 tachycardia  Improved. Follow.  #6 leukocytosis Likely reactive leukocytosis postoperatively. Patient with no respiratory symptoms. WBC trending down. Will follow for now.  #7 hypertension Continue oral Lopressor. Hydralazine as needed.  #8 bacteriuria Urine culture showed only 35,000 colonies. Patient completed 5 days of Rocephin.  #9 history of seizures Stable. Patient with no seizures. Patient refusing liquid version of home antiseizure medications secondary to taste. Continue oral Keppra while in-house and resume her home regimen on discharge.   #10 severe protein calorie malnutrition Continue ensure supplementation.   #11 obesity  #12 hypokalemia/hypomagnesemia Repleted.  #13 prophylaxis Heparin for DVT prophylaxis.   Code Status: Full Family Communication: Updated patient and sister at bedside. Disposition Plan: Back to group home with home health therapy when medically stable, hopefully tomorrow.   Consultants:  Gastroenterology: Dr. Fuller Plan 05/31/2015  Gen. surgery: Dr. Harlow Asa 05/31/2015  Oncology: Dr. Marlowe Kays 06/11/2015  Procedures:  Chest x-ray 06/08/2015  Abdominal films 06/07/2015, 06/05/2015, 06/04/2015, 06/03/2015  Right IJ catheter 06/08/2015  CT abdomen and pelvis 05/30/2015  Laparoscopic assisted right partial colectomy with resection of terminal ileum 06/08/2015 Dr. Lucia Gaskins  Colonoscopy 06/07/2015 per Dr. Ardis Hughs There was a non-circumferential, clearly malignant mass in the ascending colon (just distal to the  IC valve), measured about 6-7 cm long. The mass was not obstructive. Multiple biopsies were taken. The examination was otherwise normal  Antibiotics:  IV Rocephin 05/31/2015>>>> 06/04/2015  IV Cefotan 06/08/2015 on call to  or>>>> 06/08/2015  Oral Flagyl 06/07/2015>>>> 06/07/2015  HPI/Subjective: Patient with no complaints. Tolerating current diet. No nausea. No emesis. No abdominal pain.   Objective: Filed Vitals:   06/13/15 1414  BP: 117/71  Pulse: 104  Temp: 98 F (36.7 C)  Resp: 18    Intake/Output Summary (Last 24 hours) at 06/13/15 1424 Last data filed at 06/13/15 1415  Gross per 24 hour  Intake   1000 ml  Output   2100 ml  Net  -1100 ml   Filed Weights   06/11/15 0557 06/12/15 0500 06/13/15 0415  Weight: 86.2 kg (190 lb 0.6 oz) 83.416 kg (183 lb 14.4 oz) 83.144 kg (183 lb 4.8 oz)    Exam:   General:  NAD  Cardiovascular: RRR  Respiratory: CTAB  Abdomen: Soft/NT/ND/+ BS.   Musculoskeletal: No clubbing cyanosis or edema.  Data Reviewed: Basic Metabolic Panel:  Recent Labs Lab 06/07/15 0445 06/08/15 0451 06/09/15 0600 06/10/15 0510 06/11/15 0450 06/12/15 0345 06/13/15 0430  NA 138 136 137 137 137 137 136  K 4.2 3.6 3.8 4.2 4.5 3.8 4.0  CL 105 103 104 108 108 108 103  CO2 22 26 24 23 22 24 26   GLUCOSE 103* 126* 167* 125* 124* 118* 113*  BUN <5* <5* <5* <5* <5* 6 14  CREATININE 0.48 0.48 0.42* 0.42* 0.42* 0.53 0.47  CALCIUM 8.6* 8.9 8.4* 8.6* 8.8* 8.6* 8.6*  MG 1.8 1.7 1.5* 1.8  --  1.7  --    Liver Function Tests: No results for input(s): AST, ALT, ALKPHOS, BILITOT, PROT, ALBUMIN in the last 168 hours. No results for input(s): LIPASE, AMYLASE in the last 168 hours. No results for input(s): AMMONIA in the last 168 hours. CBC:  Recent Labs Lab 06/09/15 0600 06/10/15 0510 06/11/15 0450 06/12/15 0345 06/13/15 0430  WBC 11.5* 6.8 5.5 6.5 6.6  HGB 9.2* 9.8* 9.9* 9.2* 9.1*  HCT 28.6* 31.4* 31.2* 28.6* 28.2*  MCV 85.4 85.8 86.9 86.7  86.8  PLT 307 337 365 334 358   Cardiac Enzymes: No results for input(s): CKTOTAL, CKMB, CKMBINDEX, TROPONINI in the last 168 hours. BNP (last 3 results) No results for input(s): BNP in the last 8760 hours.  ProBNP (last 3 results) No results for input(s): PROBNP in the last 8760 hours.  CBG: No results for input(s): GLUCAP in the last 168 hours.  Recent Results (from the past 240 hour(s))  Surgical pcr screen     Status: None   Collection Time: 06/08/15  8:24 AM  Result Value Ref Range Status   MRSA, PCR NEGATIVE NEGATIVE Final   Staphylococcus aureus NEGATIVE NEGATIVE Final    Comment:        The Xpert SA Assay (FDA approved for NASAL specimens in patients over 36 years of age), is one component of a comprehensive surveillance program.  Test performance has been validated by [facility] for patients greater than or equal to 73 year old. It is not intended to diagnose infection nor to guide or monitor treatment.      Studies: No results found.  Scheduled Meds: . feeding supplement (ENSURE ENLIVE)  237 mL Oral TID BM  . heparin subcutaneous  5,000 Units Subcutaneous 3 times per day  . levETIRAcetam  1,000 mg Oral BID  . metoprolol tartrate  12.5 mg Oral BID  . sodium chloride  10-40 mL Intracatheter Q12H  . sodium chloride  3 mL Intravenous Q12H   Continuous Infusions:    Principal Problem:   SBO (small  bowel obstruction) Active Problems:   Mental retardation   Convulsions   HLD (hyperlipidemia)   Colonic mass   Small bowel obstruction   UTI (lower urinary tract infection)   Protein-calorie malnutrition, severe   Colon cancer   Seizures   Essential hypertension   Adenocarcinoma of colon    Time spent: [place] MD [facility] Pager [phone]. If 7PM-7AM, please contact night-coverage at [email or web address], password [facility] 06/13/2015, 2:24 PM  LOS: 14 days

## 2015-06-13 NOTE — Plan of Care (Signed)
Problem: Phase I Progression Outcomes Goal: Vital signs/hemodynamically stable Outcome: Progressing BP controlled, but tachy (low 100's)

## 2015-06-13 NOTE — Progress Notes (Signed)
Patient ID: [name], female   DOB: [birth year], 69 y.o.   MRN: [id number]  [facility]  POD#: 5  Subjective: Patient without complaints this AM - wants some coffee. Tolerating regular diet yesterday.  Ambulatory.  Objective: Vital signs in last 24 hours: Temp:  [97.5 F (36.4 C)-98.1 F (36.7 C)] 98.1 F (36.7 C) (06/19 0457) Pulse Rate:  [90-120] 96 (06/19 0457) Resp:  [16-18] 16 (06/19 0457) BP: (114-132)/(61-75) 116/63 mmHg (06/19 0457) SpO2:  [98 %-99 %] 98 % (06/19 0457) Weight:  [83.144 kg (183 lb 4.8 oz)] 83.144 kg (183 lb 4.8 oz) (06/19 0415) Last BM Date: 06/11/15  Intake/Output from previous day: 06/18 0701 - 06/19 0700 In: 520 [P.O.:420; IV Piggyback:100] Out: 1150 [Urine:1150] Intake/Output this shift: Total I/O In: -  Out: 350 [Urine:350]  Physical Exam: HEENT - sclerae clear, mucous membranes moist Neck - soft Chest - clear bilaterally Cor - RRR Abdomen - soft without distension; BS present; wounds clear and dry and intact Ext - no edema, non-tender Neuro - alert & oriented, no focal deficits  Lab Results:   Recent Labs  06/12/15 0345 06/13/15 0430  WBC 6.5 6.6  HGB 9.2* 9.1*  HCT 28.6* 28.2*  PLT 334 358   BMET  Recent Labs  06/12/15 0345 06/13/15 0430  NA 137 136  K 3.8 4.0  CL 108 103  CO2 24 26  GLUCOSE 118* 113*  BUN 6 14  CREATININE 0.53 0.47  CALCIUM 8.6* 8.6*   PT/INR No results for input(s): LABPROT, INR in the last 72 hours. Comprehensive Metabolic Panel:    Component Value Date/Time   NA 136 06/13/2015 0430   NA 137 06/12/2015 0345   K 4.0 06/13/2015 0430   K 3.8 06/12/2015 0345   CL 103 06/13/2015 0430   CL 108 06/12/2015 0345   CO2 26 06/13/2015 0430   CO2 24 06/12/2015 0345   BUN 14 06/13/2015 0430   BUN 6 06/12/2015 0345   CREATININE 0.47 06/13/2015 0430   CREATININE 0.53 06/12/2015 0345   GLUCOSE 113* 06/13/2015 0430   GLUCOSE 118* 06/12/2015 0345   CALCIUM 8.6*  06/13/2015 0430   CALCIUM 8.6* 06/12/2015 0345   AST 30 05/30/2015 1211   AST 19 12/10/2013 1835   ALT 17 05/30/2015 1211   ALT 15 12/10/2013 1835   ALKPHOS 64 05/30/2015 1211   ALKPHOS 52 12/10/2013 1835   BILITOT <0.1* 05/30/2015 1211   BILITOT 0.3 12/10/2013 1835   PROT 7.6 05/30/2015 1211   PROT 7.3 12/10/2013 1835   ALBUMIN 3.4* 05/30/2015 1211   ALBUMIN 3.4* 12/10/2013 1835    Studies/Results: No results found.  Anti-infectives: Anti-infectives    Start     Dose/Rate Route Frequency Ordered Stop   06/08/15 0600  cefoTEtan (CEFOTAN) 2 g in dextrose 5 % 50 mL IVPB     2 g 100 mL/hr over 30 Minutes Intravenous On call to O.R. 06/07/15 1513 06/08/15 1300   06/07/15 1600  metroNIDAZOLE (FLAGYL) tablet 1,000 mg     1,000 mg Oral 3 times per day on Mon 06/07/15 1513 06/07/15 2335   06/07/15 1600  neomycin (MYCIFRADIN) tablet 1,000 mg     1,000 mg Oral 3 times per day on Mon 06/07/15 1513 06/07/15 2334   05/31/15 0800  cefTRIAXone (ROCEPHIN) 1 g in dextrose 5 % 50 mL IVPB - Premix  Status:  Discontinued     1 g 100 mL/hr over 30 Minutes Intravenous  Every 24 hours 05/31/15 0742 06/04/15 1143      Assessment & Plans: s/p lap assisted extended right colectomy Regular diet PO pain Rx OOB, ambulate  Disposition per medical service - ready for discharge from surgical standpoint.  Earnstine Regal, MD, [facility] Office: [place] 06/13/2015

## 2015-06-13 NOTE — Care Management Note (Addendum)
Case Management Note  Patient Details  Name: [name] MRN: [id number] Date of Birth: 02-14-1946  Subjective/Objective:         s/p lap assisted extended right colectomy           Action/Plan: Group Home  Expected Discharge Date:  06/13/2015              Expected Discharge Plan:  Group Home  In-House Referral:  Clinical Social Work  Discharge planning Services  CM Consult  Post Acute Care Choice:  Home Health Choice offered to:  Sibling  DME Arranged:  [facility] rolling DME Agency:     HH Arranged:  PT, OT HH Agency:  [place]  Status of Service:  Completed, signed off  Medicare Important Message Given:  Yes Date Medicare IM Given:  06/03/15 Medicare IM give by:  [name] Date Additional Medicare IM Given:  06/11/15 Additional Medicare Important Message give by:  Margaret Spillers RN CM   If discussed at Long Length of Stay Meetings, dates discussed:    Additional Comments: 06/13/2015 1445 NCM spoke to pt and sister, [name] in the room. Plan is dc back to Group Home. Pt has a nice room already set up at [facility].   Sister states [name] will provide transportation back to facility. Will need to coordinated with facility at time of dc. Will notify CSW.  Waiting PT final recommendations for dc, Home Health PT vs Outpatient PT. Per CSW, she contacted Group Home and they do not allow HH PT. They will arrange for her to go to outpt PT. Family states Group Home Director states pt can have in home services. Will have weekday verify with Group home if pt can have HHPT. Cancelled [facility] HH at this time. Waiting final recommendations from PT. RW delivered to room.  Margaret Finner RN CCM Case Mgmt phone [phone]  06/13/2015, 2:12 PM Consulted for [place] and DME. NCM spoke to pt's sister [name] [phone] via phone. Offered choice for [facility]. Requested [facility] for [facility]. Contacted [facility] for [facility] and RW for home.  Erenest Rasher, RN 06/13/2015, 2:12 PM

## 2015-06-14 ENCOUNTER — Telehealth: Payer: Self-pay | Admitting: Hematology

## 2015-06-14 MED ORDER — POLYSACCHARIDE IRON COMPLEX 150 MG PO CAPS
150.0000 mg | ORAL_CAPSULE | Freq: Every day | ORAL | Status: DC
  Administered 2015-06-14: 150 mg via ORAL
  Filled 2015-06-14: qty 1

## 2015-06-14 MED ORDER — POLYSACCHARIDE IRON COMPLEX 150 MG PO CAPS: 150.0000 mg | ORAL_CAPSULE | Freq: Every day | ORAL | Status: DC

## 2015-06-14 NOTE — Progress Notes (Signed)
Patient ID: [name], female   DOB: 24-Oct-1946, 69 y.o.   MRN: [id number] 6 Days Post-Op  Subjective: Pt c/o some pain today, but her usual post op pain.  Eating well.  Having BMs  Objective: Vital signs in last 24 hours: Temp:  [98 F (36.7 C)-98.9 F (37.2 C)] 98.9 F (37.2 C) (06/20 0900) Pulse Rate:  [96-105] 105 (06/20 0900) Resp:  [16-18] 16 (06/20 0900) BP: (117-146)/(68-76) 146/73 mmHg (06/20 0900) SpO2:  [95 %-99 %] 97 % (06/20 0900) Last BM Date: 06/13/15  Intake/Output from previous day: 06/19 0701 - 06/20 0700 In: 1860 [P.O.:1620; I.V.:240] Out: 2000 [Urine:2000] Intake/Output this shift: Total I/O In: 240 [P.O.:240] Out: 150 [Urine:150]  PE: Abd: soft, appropriately tender, +BS, ND, incision healing well with dermanbond in place  Lab Results:   Recent Labs  06/12/15 0345 06/13/15 0430  WBC 6.5 6.6  HGB 9.2* 9.1*  HCT 28.6* 28.2*  PLT 334 358   BMET  Recent Labs  06/12/15 0345 06/13/15 0430  NA 137 136  K 3.8 4.0  CL 108 103  CO2 24 26  GLUCOSE 118* 113*  BUN 6 14  CREATININE 0.53 0.47  CALCIUM 8.6* 8.6*   PT/INR No results for input(s): LABPROT, INR in the last 72 hours. CMP     Component Value Date/Time   NA 136 06/13/2015 0430   K 4.0 06/13/2015 0430   CL 103 06/13/2015 0430   CO2 26 06/13/2015 0430   GLUCOSE 113* 06/13/2015 0430   BUN 14 06/13/2015 0430   CREATININE 0.47 06/13/2015 0430   CALCIUM 8.6* 06/13/2015 0430   PROT 7.6 05/30/2015 1211   ALBUMIN 3.4* 05/30/2015 1211   AST 30 05/30/2015 1211   ALT 17 05/30/2015 1211   ALKPHOS 64 05/30/2015 1211   BILITOT <0.1* 05/30/2015 1211   GFRNONAA >60 06/13/2015 0430   GFRAA >60 06/13/2015 0430   Lipase     Component Value Date/Time   LIPASE 11* 05/30/2015 1211       Studies/Results: No results found.  Anti-infectives: Anti-infectives    Start     Dose/Rate Route Frequency Ordered Stop   06/08/15 0600  cefoTEtan (CEFOTAN) 2 g in dextrose 5 % 50 mL IVPB     2  g 100 mL/hr over 30 Minutes Intravenous On call to O.R. 06/07/15 1513 06/08/15 1300   06/07/15 1600  metroNIDAZOLE (FLAGYL) tablet 1,000 mg     1,000 mg Oral 3 times per day on Mon 06/07/15 1513 06/07/15 2335   06/07/15 1600  neomycin (MYCIFRADIN) tablet 1,000 mg     1,000 mg Oral 3 times per day on Mon 06/07/15 1513 06/07/15 2334   05/31/15 0800  cefTRIAXone (ROCEPHIN) 1 g in dextrose 5 % 50 mL IVPB - Premix  Status:  Discontinued     1 g 100 mL/hr over 30 Minutes Intravenous Every 24 hours 05/31/15 0742 06/04/15 1143       Assessment/Plan POD 6, s/p lap assisted extended right colectomy with TI for adenocarcinoma --> 06/08/2015 - Dr. Lucia Gaskins -tolerating regular diet.  She is surgically stable for DC to group home today from our standpoint.  She has follow up already arranged with Dr. Lucia Gaskins -mobilize and pulm toilet -Pathology: pT4a, pN1b INVASIVE WELL DIFFERENTIATED ADENOCARCINOMA WITH ABUNDANT EXTRACELLULAR MUCIN, MEASURING 9 CM IN GREATEST DIMENSION. - TUMOR INVADES THROUGH UNDERLYING MUSCULARIS MUCOSA TO INVOLVE SEROSAL SURFACE. - FOUR EXTRAMURAL SATELLITE TUMOR DEPOSITS ARE IDENTIFIED. - MARGINS ARE NEGATIVE. - THREE OF TWENTY-NINE LYMPH NODES  ARE POSITIVE FOR METASTATIC CARCINOMA (3/29).  SBO -resolved DVT proph -heparin and scds    LOS: 15 days    [name] E 06/14/2015, 11:13 AM Pager: [phone]

## 2015-06-14 NOTE — Progress Notes (Signed)
Nutrition Follow-up  DOCUMENTATION CODES:  Severe malnutrition in context of acute illness/injury, Obesity unspecified  INTERVENTION: - Continue Regular diet - Continue Ensure Enlive BID  - RD will continue to monitor for needs  NUTRITION DIAGNOSIS:  Malnutrition related to acute illness as evidenced by energy intake < or equal to 50% for > or equal to 5 days, percent weight loss. -resolving with current intakes  GOAL:  Patient will meet greater than or equal to 90% of their needs -MET  MONITOR:  PO intake, Supplement acceptance, Weight trends, Labs  ASSESSMENT: Per H&P, 69 year old female with hyperlipidemia, osteoporosis, prior history of small bowel obstruction, constipation was brought to ED due to complaints of abdominal pain with multiple episodes of nausea and vomiting. History and ER records, the caregiver at the bedside. Patient is able to provide only limited history due to her mental status, underlying MR. per the caregiver, patient started complaining of generalized abdominal pain since this morning. She had multiple episodes of nausea and vomiting at home and in the ER. She had also complained "loose stools" last night. Patient is unable to describe the abdominal pain, states that it was aching all over.   6/20: - NPO 6/11-6/16; CLD 6/16; FLD 6/17; Regular 6-18 - Pt ate 100% of all meals since advancement to Regular diet  - POD #6 lap assisted extended R colectomy - She indicates good appetite with no abdominal pain or nausea - Pt likes Ensure Enlive - Meeting needs - Medications reviewed. Labs reviewed.  6/13: - NPO since admission (8 days) - NGT clamped starting 6/10 and pt tolerating well - MD note 6/12 indicates: Strong concern of infiltrating mass involving the right colon, highly suspicious for colon cancer. There are adjacent prominent mesenteric lymph nodes and/or peritoneal nodules concerning for metastatic disease.  Height:  Ht Readings from Last 1  Encounters:  06/07/15 $RemoveB'5\' 4"'NIFLGTnf$  (1.626 m)    Weight:  Wt Readings from Last 1 Encounters:  06/13/15 183 lb 4.8 oz (83.144 kg)    Ideal Body Weight:  54.5 kg (kg)  Wt Readings from Last 10 Encounters:  06/13/15 183 lb 4.8 oz (83.144 kg)  05/06/14 201 lb 3.2 oz (91.264 kg)  12/13/13 203 lb 12.8 oz (92.443 kg)  11/29/11 211 lb (95.709 kg)    BMI:  Body mass index is 31.45 kg/(m^2).  Estimated Nutritional Needs:  Kcal:  1600-1800  Protein:  70-85 grams  Fluid:  2-2.2 L/day  Skin:  Abdominal surgical wound  Diet Order:  Diet regular Room service appropriate?: Yes; Fluid consistency:: Thin Diet general  EDUCATION NEEDS:  No education needs identified at this time   Intake/Output Summary (Last 24 hours) at 06/14/15 1345 Last data filed at 06/14/15 0840  Gross per 24 hour  Intake   1200 ml  Output   1100 ml  Net    100 ml    Last BM:  6/19    Jarome Matin, RD, LDN Inpatient Clinical Dietitian Pager # [phone] After hours/weekend pager # [phone]

## 2015-06-14 NOTE — Discharge Summary (Signed)
Physician Discharge Summary  [name] EXB:[id number] DOB: October 31, 1946 DOA: 05/30/2015  PCP: Pcp Not In System  Admit date: 05/30/2015 Discharge date: 06/14/2015  Time spent: 70 minutes  Recommendations for Outpatient Follow-up:  1. Follow-up with PCP, Dr. Linden Dolin in 2 weeks. On follow-up patient blood pressure need to be reassessed. Patient also need a basic metabolic profile done to follow-up on electrolytes and renal function. Patient need a CBC done to follow-up on H&H. 2. Follow-up with Dr. Lucia Gaskins of general surgery on 07/01/2015. 3. Follow-up with Dr. Burr Medico of oncology in 3 months. Patient will be called with an appointment time.  Discharge Diagnoses:  Principal Problem:   SBO (small bowel obstruction) Active Problems:   Adenocarcinoma of colon   Mental retardation   Convulsions   HLD (hyperlipidemia)   Colonic mass   Small bowel obstruction   UTI (lower urinary tract infection)   Protein-calorie malnutrition, severe   Colon cancer   Seizures   Essential hypertension   Discharge Condition: Stable and improved  Diet recommendation: Regular  Filed Weights   06/11/15 0557 06/12/15 0500 06/13/15 0415  Weight: 86.2 kg (190 lb 0.6 oz) 83.416 kg (183 lb 14.4 oz) 83.144 kg (183 lb 4.8 oz)    History of present illness:  Per Dr Tana Coast Patient is a 69 year old female with hyperlipidemia, osteoporosis, prior history of small bowel obstruction, constipation was brought to ED due to complaints of abdominal pain with multiple episodes of nausea and vomiting. History and ER records, the caregiver at the bedside. Patient was able to provide only limited history due to her mental status, underlying MR. per the caregiver, patient started complaining of generalized abdominal pain since the morning of admission. She had multiple episodes of nausea and vomiting at home and in the ER. She had also complained "loose stools" the night prior to admission. Patient was unable to describe the abdominal  pain, stated that it was aching all over. Denied any fever, rashes, no chest pain or shortness of breath, hematochezia or melenic or hematemesis. EDP recommendations reviewed, CBC remarkable for leukocytosis 14.2 with left shift, BMET unremarkable. CT abdomen and pelvis showed recurrent high-grade mid to distal small bowel obstruction with transition point in the periumbilical region, infiltrating mass involving the right colon highly suspicious for colon cancer, and prominent mesenteric lymph nodes and/or peritoneal nodules concerning for metastatic disease. No distant metastases identified.  Hospital Course:  Major events since admission: 6/08 - NG tube placed 6/09 - slight improvement, electrolyte still depleted, needs more potassium 6/10 - no significant improvement in SBP per imaging studies but overall pt is stable  6/13 - tachy, slightly prolonged QT interval on tele, colonoscopy results below, NGT out  6/14 - Laparoscopic-assisted right partial colectomy with resection of terminal ileum. Per Dr. Lucia Gaskins   Assessment/Plan: #1 high-grade small bowel obstruction Patient had presented with nausea vomiting abdominal pain. CT scan which was done was consistent with a high-grade small bowel obstruction. Patient was admitted. Status post NG tube placed 06/02/2015 and removed 06/07/2015. Patient with no further nausea vomiting or abdominal pain. Patient improved clinically with conservative therapy. Patient was passing gas. Patient was having bowel movements. Patient's electrolytes have been repleted. Patient subsequently underwent laparoscopic assisted right partial colectomy with resection of terminal ileum on 06/08/2015 per Dr. Lucia Gaskins. Patient tolerated surgery well. Patient was subsequently started on a diet which was advanced and patient was tolerating a regular diet by day of discharge. Patient was discharged in stable and improved condition. Outpatient follow-up.  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 Patient initially underwent a colonoscopy by Dr. Ardis Hughs on 06/07/2015 which showed an non-circumferential malignant appearing mass in the ascending colon that measures 6-7 cm long and was not obstructive in nature. Multiple biopsies were taken which came back positive for adenocarcinoma. General surgery was also consulted. Patient was seen by general surgery and patient subsequently underwent laparoscopic assisted right partial colectomy with resection of terminal ileum 06/08/2015 per Dr. Lucia Gaskins. Patient's tolerated procedure well. Postoperatively patient small bowel obstruction had resolved and she was started on ice chips which was subsequently advanced to clear liquids and then a regular diet which she tolerated. Surgical biopsies came back consistent with adenocarcinoma. Per surgical note patient with multiple peritoneal nodules noted during surgery likely metastatic disease. Patient has been seen in consultation by oncology and patient due to mental retardation and discussion with family not a chemotherapy candidate. Patient will follow-up with oncology as outpatient for surveillance.   #3 mental retardation At baseline.  #4 hyperlipidemia Medications were held during the hospitalization secondary to a small bowel obstruction and nothing by mouth status. Patient's regimen will be resumed on discharge.  #5 tachycardia  Improved. Follow.  #6 leukocytosis Likely reactive leukocytosis postoperatively. Patient with no respiratory symptoms. WBC trending down. Will follow for now.  #7 hypertension Patient was noted to be hypertensive during the hospitalization. Patient was placed on IV Lopressor with better blood pressure control. Patient was subsequently transitioned to oral Lopressor when she'll be discharged on. Patient also was placed on hydralazine as needed for systolic blood pressure greater  than 180.   #8 bacteriuria Urine culture showed only 35,000 colonies. Patient completed 5 days of Rocephin.  #9 history of seizures Stable. Patient with no seizures. Patient due to her small bowel obstruction was placed on IV Keppra throughout the hospitalization. Once patient was tolerating oral intake she was resumed back on her home regimen of anti-seizure medications however was a liquid version and patient was refusing it and as such she was changed to oral Keppra during the hospitalization. Patient did not have any seizures during the hospitalization and patient will be discharged back on her home regimen. Outpatient follow-up.   #10 severe protein calorie malnutrition Once patient was tolerating oral intake patient was placed on ensure supplementation.   #11 obesity  #12 hypokalemia/hypomagnesemia Repleted.   Procedures:  Chest x-ray 06/08/2015  Abdominal films 06/07/2015, 06/05/2015, 06/04/2015, 06/03/2015  Right IJ catheter 06/08/2015  CT abdomen and pelvis 05/30/2015  Laparoscopic assisted right partial colectomy with resection of terminal ileum 06/08/2015 Dr. Lucia Gaskins  Colonoscopy 06/07/2015 per Dr. Ardis Hughs There was a non-circumferential, clearly malignant mass in the ascending colon (just distal to the IC valve), measured about 6-7 cm long. The mass was not obstructive. Multiple biopsies were taken. The examination was otherwise normal  Consultations:  Gastroenterology: Dr. Fuller Plan 05/31/2015  Gen. surgery: Dr. Harlow Asa 05/31/2015  Oncology: Dr. Marlowe Kays 06/11/2015  Discharge Exam: Filed Vitals:   06/14/15 0900  BP: 146/73  Pulse: 105  Temp: 98.9 F (37.2 C)  Resp: 16    General: NAD Cardiovascular: RRR Respiratory: CTAB  Discharge Instructions   Discharge Instructions    Diet general    Complete by:  As directed      Discharge instructions    Complete by:  As directed   Follow up with PCP in 2 weeks. Follow up with Dr Lucia Gaskins, surgery as  scheduled. Follow up with  Dr Burr Medico, oncology in 3 months     Increase activity slowly    Complete by:  As directed           Current Discharge Medication List    START taking these medications   Details  feeding supplement, ENSURE ENLIVE, (ENSURE ENLIVE) LIQD Take 237 mLs by mouth 3 (three) times daily between meals. Qty: 237 mL, Refills: 12    HYDROcodone-acetaminophen (NORCO/VICODIN) 5-325 MG per tablet Take 1-2 tablets by mouth every 4 (four) hours as needed for moderate pain. Qty: 40 tablet, Refills: 0    metoprolol tartrate (LOPRESSOR) 25 MG tablet Take 0.5 tablets (12.5 mg total) by mouth 2 (two) times daily. Qty: 60 tablet, Refills: 0      CONTINUE these medications which have NOT CHANGED   Details  atorvastatin (LIPITOR) 10 MG tablet Take 10 mg by mouth at bedtime.     bisacodyl (DULCOLAX) 5 MG EC tablet Take 5 mg by mouth daily as needed for mild constipation or moderate constipation.    calcium-vitamin D (OSCAL WITH D) 500-200 MG-UNIT per tablet Take 1 tablet by mouth 2 (two) times daily.     docusate sodium (COLACE) 100 MG capsule Take 100 mg by mouth 2 (two) times daily.    fenofibrate (TRICOR) 145 MG tablet Take 145 mg by mouth every morning.     ferrous fumarate (HEMOCYTE - 106 MG FE) 325 (106 FE) MG TABS tablet Take 1 tablet by mouth 2 (two) times daily.    furosemide (LASIX) 20 MG tablet Take 20 mg by mouth every morning.     lactulose, encephalopathy, (CHRONULAC) 10 GM/15ML SOLN Take 10 g by mouth 2 (two) times daily as needed (constipation).    metroNIDAZOLE (METROGEL) 1 % gel Apply 1 application topically daily. Started 6.1.16    potassium chloride SA (K-DUR,KLOR-CON) 20 MEQ tablet Take 20 mEq by mouth daily.     Vitamin D, Ergocalciferol, (DRISDOL) 50000 UNITS CAPS capsule Take 50,000 Units by mouth every 7 (seven) days. Friday    felbamate (FELBATOL) 600 MG tablet Take 1,200 mg by mouth 2 (two) times daily.        No Known Allergies Follow-up  Information    Follow up with [facility], MD On 07/01/2015.   Specialty:  General Surgery   Why:  1:30pm, arrive no later than 1:00pm for paperwork   Contact information:   [address] [place] [address] [phone]       Schedule an appointment as soon as possible for a visit in 2 weeks to follow up.   Why:  f/u with PCP      Follow up with [facility].   Why:  Physical Therapy and Occupational Therapy   Contact information:   [address] [place] [address] [phone]        The results of significant diagnostics from this hospitalization (including imaging, microbiology, ancillary and laboratory) are listed below for reference.    Significant Diagnostic Studies: Dg Chest 2 View  05/30/2015   CLINICAL DATA:  Nausea, vomiting, productive cough  EXAM: CHEST  2 VIEW  COMPARISON:  12/10/2013  FINDINGS: Stable cardiomegaly with mild vascular congestion and basilar atelectasis. No definite focal pneumonia, collapse or consolidation. No effusion or pneumothorax. Trachea midline. Degenerative changes of the spine with an associated scoliosis. Marked gastric distention noted with an large air-fluid level.  IMPRESSION: Cardiomegaly with vascular congestion and basilar atelectasis.  Marked gastric distention.   Electronically  Signed   By: Jerilynn Mages.  Shick M.D.   On: 05/30/2015 14:32   Dg Abd 1 View  06/02/2015   CLINICAL DATA:  Small bowel obstruction.  NG tube placement.  EXAM: ABDOMEN - 1 VIEW  COMPARISON:  06/01/2015  FINDINGS: An enteric tube has been placed with tip projecting in the left upper quadrant likely in the gastric fundus and with side hole near the GE junction. There is moderate dilatation of small bowel loops in the central abdomen measuring up to 6 cm in diameter, improved from the prior study. Stool and residual contrast are noted in the colon, which is nondilated. Right upper quadrant surgical clips are noted.  IMPRESSION: 1. Enteric  tube tip likely in the gastric fundus. 2. Moderate small bowel dilatation consistent with obstruction, improved from prior.   Electronically Signed   By: Logan Bores   On: 06/02/2015 12:55   Ct Abdomen Pelvis W Contrast  05/30/2015   CLINICAL DATA:  Nausea, vomiting and diarrhea since last night. Upper abdominal pain today. Initial encounter.  EXAM: CT ABDOMEN AND PELVIS WITH CONTRAST  TECHNIQUE: Multidetector CT imaging of the abdomen and pelvis was performed using the standard protocol following bolus administration of intravenous contrast.  CONTRAST:  58mL OMNIPAQUE IOHEXOL 300 MG/ML SOLN, 137mL OMNIPAQUE IOHEXOL 300 MG/ML SOLN  COMPARISON:  Radiographs 12/13/2013.  CT 12/10/2013.  FINDINGS: Lower chest: Mildly increased atelectasis at both lung bases. There is no pleural or pericardial effusion. A small hiatal hernia is noted.  Hepatobiliary: Intra and extrahepatic biliary dilatation is similar to the prior examination. The common bile duct measures up to 14 mm in diameter and appears dilated to the ampulla. The gallbladder is surgically absent. No focal hepatic abnormalities identified.  Pancreas: Unremarkable. No pancreatic ductal dilatation or surrounding inflammatory changes.  Spleen: Normal in size without focal abnormality.  Adrenals/Urinary Tract: Both adrenal glands appear normal.There are tiny low-density renal lesions bilaterally, likely cysts. No suspicious renal finding or hydronephrosis. There is no evidence of urinary tract calculus. The bladder is largely decompressed.  Stomach/Bowel: There is recurrent moderate distension of the stomach and proximal to mid small bowel. The distal small bowel is decompressed. Transition zone appears to be in the periumbilical region where there is mild twisting of the small bowel but no discrete volvulus or soft tissue mass.The colon is normal in caliber. There is stool throughout the colon. There is strong concern of an infiltrating mass involving the right  colon, measuring up to 6.1 cm transverse on image 47. There are prominent adjacent lymph nodes or peritoneal nodules, described below. No evidence of bowel perforation.  Vascular/Lymphatic: Stable atherosclerosis of the aorta, its branches and the iliac arteries. The left renal vein is circumaortic. There are multiple prominent lymph nodes around the right colon, including a 12 mm nodule lateral to it on image 42. Prominent ileocecal mesenteric nodes measure up to 8 mm on image 51. There is a prominent central mesenteric node measuring 9 mm on image 53. There is a small amount of perihepatic and pelvic ascites. There is no generalized peritoneal nodularity.  Reproductive: Stable uterine atrophy.  No adnexal mass.  Other: Stable postsurgical changes within the low anterior abdominal wall. No hernia identified.  Musculoskeletal: No acute or significant osseous findings. Moderate lumbar spondylosis appears unchanged.  IMPRESSION: 1. Recurrent high-grade mid to distal small bowel obstruction with transition point in the periumbilical region. 2. Strong concern of infiltrating mass involving the right colon, highly suspicious for colon cancer. There are  adjacent prominent mesenteric lymph nodes and/or peritoneal nodules concerning for metastatic disease. No distant metastases identified. This colon mass does not appear to be the etiology for the bowel obstruction. 3. Small amount of free pelvic fluid without generalized peritoneal nodularity. 4. Generally stable biliary dilatation status post cholecystectomy.   Electronically Signed   By: Richardean Sale M.D.   On: 05/30/2015 16:54   Dg Chest Port 1 View  06/08/2015   CLINICAL DATA:  Central line placement.  EXAM: PORTABLE CHEST - 1 VIEW  COMPARISON:  Single view of the chest 06/03/2015.  FINDINGS: A new right IJ catheter is in place with the tip projecting the right atrium. Recommend withdrawal of approximately 5-6 cm. There is no pneumothorax. Cardiomegaly is noted.  Mild left basilar atelectasis is seen.  IMPRESSION: Tip of right IJ catheter projects in the right atrium. Recommend withdrawal of 5-6 cm. Negative for pneumothorax. Findings called to the patient's nurse, Santiago Glad, at the time of interpretation.   Electronically Signed   By: Inge Rise M.D.   On: 06/08/2015 16:38   Dg Abd 2 Views  06/07/2015   CLINICAL DATA:  Patient status post colonoscopy.  EXAM: ABDOMEN - 2 VIEW  COMPARISON:  Abdominal radiograph 06/05/2015  FINDINGS: Slight interval improvement in gaseous distended loops of small bowel particularly within the left upper quadrant as can be seen with obstruction, described on prior evaluations. Decubitus images demonstrate no definite evidence for free intraperitoneal air. Enteric tube tip and side-port project over the left upper quadrant. Cholecystectomy clips. Lumbar spine degenerative changes.  IMPRESSION: Findings most compatible with improving small bowel obstruction.   Electronically Signed   By: Lovey Newcomer M.D.   On: 06/07/2015 11:30   Dg Abd 2 Views  06/05/2015   CLINICAL DATA:  Small bowel obstruction.  EXAM: ABDOMEN - 2 VIEW  COMPARISON:  06/04/2015  FINDINGS: Enteric tube remains in place, with tip now directed laterally in the region of the gastric fundus and side hole near the level the GE junction. Right upper quadrant abdominal surgical clips are noted. There is no evidence of intraperitoneal free air. Mildly to moderately dilated small bowel loops are again seen in the mid abdomen, predominantly left-sided with overall small bowel distension slightly improved from the prior study. A few small bowel air-fluid levels are again seen. Gas is again noted in nondilated colon. No acute osseous abnormality.  IMPRESSION: Dilated small bowel loops consistent with obstruction, slightly improved from prior.   Electronically Signed   By: Logan Bores   On: 06/05/2015 08:24   Dg Abd 2 Views  06/04/2015   CLINICAL DATA:  Small bowel obstruction.  Abdominal pain, distention.  EXAM: ABDOMEN - 2 VIEW  COMPARISON:  06/03/2015  FINDINGS: NG tube is in the stomach. Mildly dilated mid abdominal small bowel loops are again noted, similar to prior study. Gas within nondistended colon. Findings compatible with partial small bowel obstruction, unchanged. Prior cholecystectomy. No organomegaly or acute bony abnormality.  IMPRESSION: Continued small bowel obstruction pattern, not significantly changed.   Electronically Signed   By: Rolm Baptise M.D.   On: 06/04/2015 11:18   Dg Abd 2 Views  06/01/2015   CLINICAL DATA:  Small bowel obstruction. Abdominal pain and distention.  EXAM: ABDOMEN - 2 VIEW  COMPARISON:  CT topogram on 05/30/2015  FINDINGS: Multiple moderately dilated small bowel loops are again seen containing air-fluid levels, without significant change compared to prior exam. Some gas and oral contrast is seen within nondilated colon.  This is consistent with a high-grade partial small bowel obstruction. No evidence of free intraperitoneal air. Right upper quadrant surgical clips seen from prior cholecystectomy.  IMPRESSION: High-grade partial small bowel obstruction, without significant change compared to recent CT.   Electronically Signed   By: Earle Gell M.D.   On: 06/01/2015 10:59   Dg Abd Acute W/chest  06/03/2015   CLINICAL DATA:  Small-bowel obstruction  EXAM: DG ABDOMEN ACUTE W/ 1V CHEST  COMPARISON:  06/02/2015  FINDINGS: Small bowel dilatation shows mild interval improvement. NG tube in the stomach. No free air. Colon decompressed. Surgical clips in the gallbladder fossa.  IMPRESSION: Dilated small bowel loops show mild interval improvement from the prior study consistent with small bowel obstruction.   Electronically Signed   By: Franchot Gallo M.D.   On: 06/03/2015 14:49    Microbiology: Recent Results (from the past 240 hour(s))  Surgical pcr screen     Status: None   Collection Time: 06/08/15  8:24 AM  Result Value Ref Range Status    MRSA, PCR NEGATIVE NEGATIVE Final   Staphylococcus aureus NEGATIVE NEGATIVE Final    Comment:        The Xpert SA Assay (FDA approved for NASAL specimens in patients over 55 years of age), is one component of a comprehensive surveillance program.  Test performance has been validated by [facility] for patients greater than or equal to 82 year old. It is not intended to diagnose infection nor to guide or monitor treatment.      Labs: Basic Metabolic Panel:  Recent Labs Lab 06/08/15 0451 06/09/15 0600 06/10/15 0510 06/11/15 0450 06/12/15 0345 06/13/15 0430  NA 136 137 137 137 137 136  K 3.6 3.8 4.2 4.5 3.8 4.0  CL 103 104 108 108 108 103  CO2 26 24 23 22 24 26   GLUCOSE 126* 167* 125* 124* 118* 113*  BUN <5* <5* <5* <5* 6 14  CREATININE 0.48 0.42* 0.42* 0.42* 0.53 0.47  CALCIUM 8.9 8.4* 8.6* 8.8* 8.6* 8.6*  MG 1.7 1.5* 1.8  --  1.7  --    Liver Function Tests: No results for input(s): AST, ALT, ALKPHOS, BILITOT, PROT, ALBUMIN in the last 168 hours. No results for input(s): LIPASE, AMYLASE in the last 168 hours. No results for input(s): AMMONIA in the last 168 hours. CBC:  Recent Labs Lab 06/09/15 0600 06/10/15 0510 06/11/15 0450 06/12/15 0345 06/13/15 0430  WBC 11.5* 6.8 5.5 6.5 6.6  HGB 9.2* 9.8* 9.9* 9.2* 9.1*  HCT 28.6* 31.4* 31.2* 28.6* 28.2*  MCV 85.4 85.8 86.9 86.7 86.8  PLT 307 337 365 334 358   Cardiac Enzymes: No results for input(s): CKTOTAL, CKMB, CKMBINDEX, TROPONINI in the last 168 hours. BNP: BNP (last 3 results) No results for input(s): BNP in the last 8760 hours.  ProBNP (last 3 results) No results for input(s): PROBNP in the last 8760 hours.  CBG: No results for input(s): GLUCAP in the last 168 hours.     SignedIrine Seal MD [facility] 06/14/2015, 12:57 PM

## 2015-06-14 NOTE — Care Management Note (Signed)
Case Management Note  Patient Details  Name: [name] MRN: [id number] Date of Birth: 11-28-1946  Subjective/Objective:        Admitted with SBO             Action/Plan: Discharge planning per CSW, patient to f/u with OP physical therapy, Group Home to take patient as scheduled  Expected Discharge Date:   (unknown)               Expected Discharge Plan:  Group Home  In-House Referral:  Clinical Social Work  Discharge planning Services  CM Consult  Post Acute Care Choice:  Home Health Choice offered to:  Sibling  DME Arranged:  [facility] rolling DME Agency:     HH Arranged:  NA HH Agency:     Status of Service:  Completed, signed off  Medicare Important Message Given:  Yes Date Medicare IM Given:  06/03/15 Medicare IM give by:  Leanne Chang Date Additional Medicare IM Given:  06/14/15 Additional Medicare Important Message give by:  Sunday Spillers RN CM   If discussed at Long Length of Stay Meetings, dates discussed:    Additional Comments:  Guadalupe Maple, RN 06/14/2015, 12:04 PM

## 2015-06-14 NOTE — Progress Notes (Signed)
Physical Therapy Treatment Patient Details Name: [name] MRN: [id number] DOB: 11-08-1946 Today's Date: 06/14/2015    History of Present Illness 69 year old female with recurrent small bowel obstruction probably secondary to adhesions. Pt with MR and was I at group home.  s/p lap R partial colectomy and resection    PT Comments    Pt is modified independent with mobility, she walked 500' with RW without loss of balance. She reported "a lot" of pain in abdomen with walking, pain meds requested. From PT standpoint she is ready to DC back to her group home where she is encouraged to walk 2-3x/day.   Follow Up Recommendations  No PT follow up (return to group home, encourage frequent ambulation (discussed this with pt's sister))     Equipment Recommendations  Rolling walker with 5" wheels    Recommendations for Other Services       Precautions / Restrictions Precautions Precautions: Fall Precaution Comments: mental retardation Restrictions Weight Bearing Restrictions: No    Mobility  Bed Mobility Overal bed mobility: Modified Independent             General bed mobility comments: HOB up 45*, mod I for in and out of bed  Transfers Overall transfer level: Needs assistance Equipment used: Rolling walker (2 wheeled) Transfers: Sit to/from Stand Sit to Stand: Modified independent (Device/Increase time)         General transfer comment: Mod I with RW for sit to/from stand  Ambulation/Gait Ambulation/Gait assistance: Modified independent (Device/Increase time) Ambulation Distance (Feet): 500 Feet Assistive device: Rolling walker (2 wheeled) Gait Pattern/deviations: WFL(Within Functional Limits) Gait velocity: WFL   General Gait Details: steady with RW, pt c/o "a lot" of abdominal incision pain, pain meds requested   Stairs            Wheelchair Mobility    Modified Rankin (Stroke Patients Only)       Balance                                    Cognition Arousal/Alertness: Awake/alert Behavior During Therapy: WFL for tasks assessed/performed Overall Cognitive Status: History of cognitive impairments - at baseline                      Exercises      General Comments        Pertinent Vitals/Pain Pain Assessment: Faces Faces Pain Scale: Hurts even more Pain Location: abdominal incision Pain Descriptors / Indicators: Sore Pain Intervention(s): Monitored during session;Patient requesting pain meds-RN notified;Limited activity within patient's tolerance    Home Living                      Prior Function            PT Goals (current goals can now be found in the care plan section) Acute Rehab PT Goals Patient Stated Goal: go home PT Goal Formulation: With patient/family Time For Goal Achievement: 06/23/15 Potential to Achieve Goals: Good Progress towards PT goals: Goals met/education completed, patient discharged from PT    Frequency  Min 3X/week    PT Plan Current plan remains appropriate    Co-evaluation             End of Session   Activity Tolerance: Patient tolerated treatment well Patient left: with call bell/phone within reach;with family/visitor present;in bed     Time: 2536-6440  PT Time Calculation (min) (ACUTE ONLY): 16 min  Charges:  $Gait Training: 8-22 mins                    G Codes:      Philomena Doheny 06/14/2015, 10:16 AM [phone]

## 2015-06-14 NOTE — Telephone Encounter (Signed)
new patient appt-s/w patient sister [name] and gave np appt for 08/18 @ 10:30 w/Dr. Burr Medico Calendar mailed.

## 2015-06-14 NOTE — Progress Notes (Signed)
CSW assisting with d/c planning. NSG reviewed d/c summary, scripts, avs. D/C Summary, FL2 sent to group home for review prior to d/c. Scripts included in d/c packet. Ms. Juleen China from group home will transport pt. Pt / family are in agreement with d/c back to group home today. Pt's sister Lelan Pons ) contacted prior to d/c. NSG will provide pt/Ms. Wallace with d/c packet prior to d/c.  Werner Lean LCSW [phone]

## 2015-06-14 NOTE — Progress Notes (Signed)
Group Home is able to accept pt once DC Summary is available. PT is not required at D/C.   Werner Lean LCSW [phone]

## 2015-06-14 NOTE — Progress Notes (Signed)
CSW spoke with [name] from group home this am. PT notes will be reviewed with [name], once available,  to determine if continued therapy will be needed at d/c.  Werner Lean LCSW [phone]

## 2015-06-24 ENCOUNTER — Encounter (HOSPITAL_COMMUNITY): Payer: Self-pay

## 2015-08-11 NOTE — Progress Notes (Signed)
This encounter was created in error - please disregard.

## 2015-08-12 ENCOUNTER — Emergency Department (HOSPITAL_COMMUNITY): Payer: Medicare Other

## 2015-08-12 ENCOUNTER — Encounter (HOSPITAL_COMMUNITY): Payer: Self-pay

## 2015-08-12 ENCOUNTER — Telehealth: Payer: Self-pay | Admitting: *Deleted

## 2015-08-12 ENCOUNTER — Emergency Department (HOSPITAL_COMMUNITY)
Admission: EM | Admit: 2015-08-12 | Discharge: 2015-08-12 | Disposition: A | Discharge status: Home / Self Care | Payer: Medicare Other | Attending: Emergency Medicine | Admitting: Emergency Medicine

## 2015-08-12 ENCOUNTER — Telehealth: Payer: Self-pay | Admitting: Hematology

## 2015-08-12 ENCOUNTER — Ambulatory Visit: Payer: Medicare Other

## 2015-08-12 ENCOUNTER — Encounter: Payer: Medicare Other | Admitting: Hematology

## 2015-08-12 DIAGNOSIS — Z79899 Other long term (current) drug therapy: Secondary | ICD-10-CM | POA: Insufficient documentation

## 2015-08-12 DIAGNOSIS — Z9049 Acquired absence of other specified parts of digestive tract: Secondary | ICD-10-CM | POA: Diagnosis not present

## 2015-08-12 DIAGNOSIS — G40909 Epilepsy, unspecified, not intractable, without status epilepticus: Secondary | ICD-10-CM | POA: Diagnosis not present

## 2015-08-12 DIAGNOSIS — K5900 Constipation, unspecified: Secondary | ICD-10-CM | POA: Diagnosis not present

## 2015-08-12 DIAGNOSIS — M810 Age-related osteoporosis without current pathological fracture: Secondary | ICD-10-CM | POA: Insufficient documentation

## 2015-08-12 DIAGNOSIS — E780 Pure hypercholesterolemia: Secondary | ICD-10-CM | POA: Insufficient documentation

## 2015-08-12 DIAGNOSIS — R1084 Generalized abdominal pain: Secondary | ICD-10-CM | POA: Diagnosis not present

## 2015-08-12 DIAGNOSIS — R000 Tachycardia, unspecified: Secondary | ICD-10-CM | POA: Insufficient documentation

## 2015-08-12 DIAGNOSIS — R1010 Upper abdominal pain, unspecified: Secondary | ICD-10-CM | POA: Diagnosis present

## 2015-08-12 LAB — CBC WITH DIFFERENTIAL/PLATELET
BASOS ABS: 0 10*3/uL (ref 0.0–0.1)
BASOS PCT: 0 % (ref 0–1)
Eosinophils Absolute: 0 10*3/uL (ref 0.0–0.7)
Eosinophils Relative: 0 % (ref 0–5)
HCT: 36.8 % (ref 36.0–46.0)
Hemoglobin: 11.6 g/dL — ABNORMAL LOW (ref 12.0–15.0)
LYMPHS PCT: 13 % (ref 12–46)
Lymphs Abs: 1.2 10*3/uL (ref 0.7–4.0)
MCH: 27.8 pg (ref 26.0–34.0)
MCHC: 31.5 g/dL (ref 30.0–36.0)
MCV: 88.2 fL (ref 78.0–100.0)
MONO ABS: 0.6 10*3/uL (ref 0.1–1.0)
Monocytes Relative: 7 % (ref 3–12)
Neutro Abs: 6.8 10*3/uL (ref 1.7–7.7)
Neutrophils Relative %: 80 % — ABNORMAL HIGH (ref 43–77)
PLATELETS: 473 10*3/uL — AB (ref 150–400)
RBC: 4.17 MIL/uL (ref 3.87–5.11)
RDW: 14.6 % (ref 11.5–15.5)
WBC: 8.7 10*3/uL (ref 4.0–10.5)

## 2015-08-12 LAB — URINALYSIS, ROUTINE W REFLEX MICROSCOPIC
Bilirubin Urine: NEGATIVE
Glucose, UA: NEGATIVE mg/dL
Hgb urine dipstick: NEGATIVE
KETONES UR: NEGATIVE mg/dL
LEUKOCYTES UA: NEGATIVE
NITRITE: NEGATIVE
Protein, ur: NEGATIVE mg/dL
Specific Gravity, Urine: 1.025 (ref 1.005–1.030)
Urobilinogen, UA: 0.2 mg/dL (ref 0.0–1.0)
pH: 5.5 (ref 5.0–8.0)

## 2015-08-12 LAB — COMPREHENSIVE METABOLIC PANEL
ALT: 10 U/L — ABNORMAL LOW (ref 14–54)
ANION GAP: 10 (ref 5–15)
AST: 17 U/L (ref 15–41)
Albumin: 2.8 g/dL — ABNORMAL LOW (ref 3.5–5.0)
Alkaline Phosphatase: 68 U/L (ref 38–126)
BUN: 13 mg/dL (ref 6–20)
CHLORIDE: 101 mmol/L (ref 101–111)
CO2: 27 mmol/L (ref 22–32)
Calcium: 9 mg/dL (ref 8.9–10.3)
Creatinine, Ser: 0.71 mg/dL (ref 0.44–1.00)
GFR calc Af Amer: >60 mL/min (ref >60)
Glucose, Bld: 109 mg/dL — ABNORMAL HIGH (ref 65–99)
POTASSIUM: 4.1 mmol/L (ref 3.5–5.1)
Sodium: 138 mmol/L (ref 135–145)
Total Bilirubin: 0.4 mg/dL (ref 0.3–1.2)
Total Protein: 7.1 g/dL (ref 6.5–8.1)

## 2015-08-12 LAB — LIPASE, BLOOD: LIPASE: 15 U/L — AB (ref 22–51)

## 2015-08-12 MED ORDER — SODIUM CHLORIDE 0.9 % IV SOLN: INTRAVENOUS | Status: DC

## 2015-08-12 MED ORDER — IOHEXOL 300 MG/ML  SOLN: 50.0000 mL | Freq: Once | INTRAMUSCULAR | Status: DC | PRN

## 2015-08-12 MED ORDER — SODIUM CHLORIDE 0.9 % IV BOLUS (SEPSIS)
1000.0000 mL | Freq: Once | INTRAVENOUS | Status: AC
  Administered 2015-08-12: 1000 mL via INTRAVENOUS

## 2015-08-12 MED ORDER — ONDANSETRON HCL 4 MG/2ML IJ SOLN
4.0000 mg | Freq: Once | INTRAMUSCULAR | Status: AC
  Administered 2015-08-12: 4 mg via INTRAVENOUS
  Filled 2015-08-12: qty 2

## 2015-08-12 NOTE — Discharge Instructions (Signed)
Follow-up in the cancer center as we discussed. Called the clinic today to confirm the appointment

## 2015-08-12 NOTE — Telephone Encounter (Signed)
S/w Ms. [name] and gave appt for 08/22 @ 10:30 w/Dr. Burr Medico

## 2015-08-12 NOTE — ED Notes (Signed)
Pt c/o intermittent RUQ pain x 2 months.  Pain score 6/10.  Denies n/v/d.  Pt's group home manager reports Pt had surgery due to a bowel obstruction in June.  Hx of colon CA w/o treatment.

## 2015-08-12 NOTE — Telephone Encounter (Signed)
Received vm call from [facility] for this pt.  They took pt to ED today d/t not feeling well & states that labs were done & was told by ED doc that ca has spread & that family needs to sit down with Dr Burr Medico & discuss. Informed that we would get pt r/s.  Information given to Tiffany/HIM to r/s within next 2 wks per Dr Burr Medico.

## 2015-08-12 NOTE — ED Provider Notes (Signed)
CSN: [id number]     Arrival date & time 08/12/15  1151 History   First MD Initiated Contact with Patient 08/12/15 1340     Chief Complaint  Patient presents with  . Abdominal Pain     (Consider location/radiation/quality/duration/timing/severity/associated sxs/prior Treatment) HPI Comments: Patient here with intermittent right flank and upper quadrant pain 2 months. No nausea vomiting or diarrhea. History of bowel obstruction but this is not similar. No reported fever or chills. Some dysuria noted without hematuria. Symptoms have been persistent and nothing makes them better or worse. No treatment use prior to arrival  Patient is a 69 y.o. female presenting with abdominal pain. The history is provided by the patient and a caregiver.  Abdominal Pain   Past Medical History  Diagnosis Date  . Hypercholesteremia   . Constipation   . Osteoporosis   . Seizure   . Edema   . Mental retardation   . SBO (small bowel obstruction) 11/2013, 05/2015    likely due to adhesions  . Colonic mass 05/2015    right, concerning for cancer   Past Surgical History  Procedure Laterality Date  . Cholecystectomy    . Colonoscopy with propofol N/A 06/07/2015    Procedure: COLONOSCOPY WITH PROPOFOL;  Surgeon: [name], MD;  Location: [facility];  Service: Endoscopy;  Laterality: N/A;  . Laparoscopic partial right colectomy N/A 06/08/2015    Procedure: LAPAROSCOPIC PARTIAL COLECTOMY ;WITH RESECTION OF RIGHT TERMINAL ILIUM;  Surgeon: Alphonsa Overall, MD;  Location: [facility];  Service: General;  Laterality: N/A;   History reviewed. No pertinent family history. Social History  Substance Use Topics  . Smoking status: Never Smoker   . Smokeless tobacco: None  . Alcohol Use: No   OB History    No data available     Review of Systems  Gastrointestinal: Positive for abdominal pain.  All other systems reviewed and are negative.     Allergies  Review of patient's allergies indicates no known  allergies.  Home Medications   Prior to Admission medications   Medication Sig Start Date End Date Taking? Authorizing Provider  atorvastatin (LIPITOR) 10 MG tablet Take 10 mg by mouth at bedtime.     Historical Provider, MD  bisacodyl (DULCOLAX) 5 MG EC tablet Take 5 mg by mouth daily as needed for mild constipation or moderate constipation.    Historical Provider, MD  calcium-vitamin D (OSCAL WITH D) 500-200 MG-UNIT per tablet Take 1 tablet by mouth 2 (two) times daily.     Historical Provider, MD  docusate sodium (COLACE) 100 MG capsule Take 100 mg by mouth 2 (two) times daily.    Historical Provider, MD  feeding supplement, ENSURE ENLIVE, (ENSURE ENLIVE) LIQD Take 237 mLs by mouth 3 (three) times daily between meals. 06/13/15   Eugenie Filler, MD  felbamate (FELBATOL) 600 MG tablet Take 1,200 mg by mouth 2 (two) times daily.     Historical Provider, MD  fenofibrate (TRICOR) 145 MG tablet Take 145 mg by mouth every morning.     Historical Provider, MD  ferrous fumarate (HEMOCYTE - 106 MG FE) 325 (106 FE) MG TABS tablet Take 1 tablet by mouth 2 (two) times daily.    Historical Provider, MD  furosemide (LASIX) 20 MG tablet Take 20 mg by mouth every morning.     Historical Provider, MD  HYDROcodone-acetaminophen (NORCO/VICODIN) 5-325 MG per tablet Take 1-2 tablets by mouth every 4 (four) hours as needed for moderate pain. 06/11/15   Saverio Danker,  PA-C  lactulose, encephalopathy, (CHRONULAC) 10 GM/15ML SOLN Take 10 g by mouth 2 (two) times daily as needed (constipation).    Historical Provider, MD  metoprolol tartrate (LOPRESSOR) 25 MG tablet Take 0.5 tablets (12.5 mg total) by mouth 2 (two) times daily. 06/13/15   Eugenie Filler, MD  metroNIDAZOLE (METROGEL) 1 % gel Apply 1 application topically daily. Started 6.1.16    Historical Provider, MD  potassium chloride SA (K-DUR,KLOR-CON) 20 MEQ tablet Take 20 mEq by mouth daily.     Historical Provider, MD  Vitamin D, Ergocalciferol, (DRISDOL)  50000 UNITS CAPS capsule Take 50,000 Units by mouth every 7 (seven) days. Friday    Historical Provider, MD   BP 109/72 mmHg  Pulse 118  Temp(Src) 97.4 F (36.3 C) (Oral)  Resp 18  SpO2 97% Physical Exam  Constitutional: She is oriented to person, place, and time. She appears well-developed and well-nourished.  Non-toxic appearance. No distress.  HENT:  Head: Normocephalic and atraumatic.  Eyes: Conjunctivae, EOM and lids are normal. Pupils are equal, round, and reactive to light.  Neck: Normal range of motion. Neck supple. No tracheal deviation present. No thyroid mass present.  Cardiovascular: Regular rhythm and normal heart sounds.  Tachycardia present.  Exam reveals no gallop.   No murmur heard. Pulmonary/Chest: Effort normal and breath sounds normal. No stridor. No respiratory distress. She has no decreased breath sounds. She has no wheezes. She has no rhonchi. She has no rales.  Abdominal: Soft. Normal appearance and bowel sounds are normal. She exhibits no distension. There is no tenderness. There is no rigidity, no rebound, no guarding and no CVA tenderness.  Musculoskeletal: Normal range of motion. She exhibits no edema or tenderness.  Neurological: She is alert and oriented to person, place, and time. She has normal strength. No cranial nerve deficit or sensory deficit. GCS eye subscore is 4. GCS verbal subscore is 5. GCS motor subscore is 6.  Skin: Skin is warm and dry. No abrasion and no rash noted.  Psychiatric: She has a normal mood and affect. Her speech is normal and behavior is normal.  Nursing note and vitals reviewed.   ED Course  Procedures (including critical care time) Labs Review Labs Reviewed  CBC WITH DIFFERENTIAL/PLATELET  COMPREHENSIVE METABOLIC PANEL  LIPASE, BLOOD  URINALYSIS, ROUTINE W REFLEX MICROSCOPIC (NOT AT [facility])    Imaging Review No results found. I have personally reviewed and evaluated these images and lab results as part of my medical  decision-making.   EKG Interpretation None      MDM   Final diagnoses:  None    Discussed with patient's oncologist CT results findings and patient will follow-up in the oncology clinic Group Home staff notified    Lacretia Leigh, MD 08/12/15 [phone]

## 2015-08-16 ENCOUNTER — Telehealth: Payer: Self-pay | Admitting: Hematology

## 2015-08-16 ENCOUNTER — Encounter: Payer: Self-pay | Admitting: Hematology

## 2015-08-16 ENCOUNTER — Ambulatory Visit (HOSPITAL_BASED_OUTPATIENT_CLINIC_OR_DEPARTMENT_OTHER): Payer: Medicare Other | Admitting: Hematology

## 2015-08-16 ENCOUNTER — Ambulatory Visit: Payer: Medicare Other

## 2015-08-16 VITALS — BP 126/62 | HR 111 | Temp 97.7°F | Resp 20 | Ht 64.0 in | Wt 195.6 lb

## 2015-08-16 DIAGNOSIS — C182 Malignant neoplasm of ascending colon: Secondary | ICD-10-CM | POA: Diagnosis present

## 2015-08-16 DIAGNOSIS — C189 Malignant neoplasm of colon, unspecified: Secondary | ICD-10-CM

## 2015-08-16 MED ORDER — HYDROCODONE-ACETAMINOPHEN 5-325 MG PO TABS: 1.0000 | ORAL_TABLET | Freq: Four times a day (QID) | ORAL | Status: DC | PRN

## 2015-08-16 NOTE — Telephone Encounter (Signed)
per pof to sh pt appt-gave pt copt of avs

## 2015-08-16 NOTE — Progress Notes (Signed)
[name]  [phone] [phone]  Clinic Follow up Note   Patient Care Team: Pcp Not In System as PCP - General 08/16/2015  SUMMARY OF ONCOLOGIC HISTORY: Oncology History   Colon cancer   Staging form: Colon and Rectum, AJCC 7th Edition     Pathologic: Stage IIIB (T4a, N1b, cM0) - Signed by Truitt Merle, MD on 08/11/2015       Colon cancer   05/30/2015 Imaging CT abdomen and pelvis with contrast showed recurrent high-grade mid to distal small bowel obstruction. Infiltrating mass involving the right colon. There are adjacent prominent mesenteric lymph nodes and/or peritoneal nodules converning for mets.    06/08/2015 Initial Diagnosis Colon cancer   06/08/2015 Surgery Right colon and terminal ileum segmental resection, surgical margins were negative.   06/08/2015 Pathology Results Invasive well differentiated adenocarcinoma, measuring 9 cm, tumor invades through muscularis mucosa to involve serosal surface. 4 extramural satellite tumor deposits are and then to light, 3 out of 29 lymph nodes were positive.   06/08/2015 Miscellaneous tumor MMR normal, MSI stable.    INTERVAL HISTORY: [name] returns for follow-up. I initially saw her in June 2016 when she was in the hospital for newly diagnosed colon cancer and surgery. Decision was made to not pursue adjuvant chemotherapy, and I'll follow her clinically. She presents to the clinic with her sister, nephew and staff from her group home.  Since her hospital discharge 2 months ago, she was initially doing well. Since about 3-4 weeks ago, she was noticed to have less appetite and energy, eats last, and he complains about right upper quadrant abdominal pain intermittently in the past week. 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 She was discharged back to her group home, with the plan to follow-up with me this week.   REVIEW OF SYSTEMS:     Constitutional: Denies fevers, chills or abnormal weight loss, (+) fatigue  Eyes: Denies blurriness of vision Ears, nose, mouth, throat, and face: Denies mucositis or sore throat Respiratory: Denies cough, dyspnea or wheezes Cardiovascular: Denies palpitation, chest discomfort or lower extremity swelling Gastrointestinal:  Denies nausea, heartburn or change in bowel habits, (+) intermittent right upper quadrant abdominal pain Skin: Denies abnormal skin rashes Lymphatics: Denies new lymphadenopathy or easy bruising Neurological:Denies numbness, tingling or new weaknesses Behavioral/Psych: Mood is stable, no new changes  All other systems were reviewed with the patient and are negative.  MEDICAL HISTORY:  Past Medical History  Diagnosis Date  . Hypercholesteremia   . Constipation   . Osteoporosis   . Seizure   . Edema   . Mental retardation   . SBO (small bowel obstruction) 11/2013, 05/2015    likely due to adhesions  . Colonic mass 05/2015    right, concerning for cancer    SURGICAL HISTORY: Past Surgical History  Procedure Laterality Date  . Cholecystectomy    . Colonoscopy with propofol N/A 06/07/2015    Procedure: COLONOSCOPY WITH PROPOFOL;  Surgeon: [name], MD;  Location: [facility];  Service: Endoscopy;  Laterality: N/A;  . Laparoscopic partial right colectomy N/A 06/08/2015    Procedure: LAPAROSCOPIC PARTIAL COLECTOMY ;WITH RESECTION OF RIGHT TERMINAL ILIUM;  Surgeon: Alphonsa Overall, MD;  Location: [facility];  Service: General;  Laterality: N/A;    I have reviewed the social history and family history with the patient and they are unchanged from previous note.  ALLERGIES:  has No Known Allergies.  MEDICATIONS:  Current  Outpatient Prescriptions  Medication Sig Dispense Refill  . atorvastatin (LIPITOR) 10 MG tablet Take 10 mg by mouth at bedtime.     . bisacodyl (DULCOLAX) 5 MG EC tablet Take 5 mg by mouth daily as needed for mild constipation or moderate  constipation.    . calcium-vitamin D (OSCAL WITH D) 500-200 MG-UNIT per tablet Take 1 tablet by mouth 2 (two) times daily.     . Cholecalciferol (VITAMIN D) 2000 UNITS tablet Take 4,000 Units by mouth daily with breakfast.    . docusate sodium (COLACE) 100 MG capsule Take 100 mg by mouth 2 (two) times daily.    . felbamate (FELBATOL) 600 MG tablet Take 1,200 mg by mouth 2 (two) times daily.     . fenofibrate (TRICOR) 145 MG tablet Take 145 mg by mouth every morning.     . ferrous sulfate 325 (65 FE) MG tablet Take 325 mg by mouth 2 (two) times daily.    . furosemide (LASIX) 20 MG tablet Take 20 mg by mouth every morning.     Marland Kitchen HYDROcodone-acetaminophen (NORCO/VICODIN) 5-325 MG per tablet Take 1-2 tablets by mouth every 4 (four) hours as needed for moderate pain. (Patient not taking: Reported on 08/12/2015) 40 tablet 0  . lactulose, encephalopathy, (CHRONULAC) 10 GM/15ML SOLN Take 10 g by mouth 2 (two) times daily.     . metoprolol tartrate (LOPRESSOR) 25 MG tablet Take 0.5 tablets (12.5 mg total) by mouth 2 (two) times daily. 60 tablet 0  . metroNIDAZOLE (METROGEL) 1 % gel Apply 1 application topically daily. Apply to reddened areas of face    . potassium chloride SA (K-DUR,KLOR-CON) 20 MEQ tablet Take 20 mEq by mouth daily.      No current facility-administered medications for this visit.    PHYSICAL EXAMINATION: ECOG PERFORMANCE STATUS: 2 - Symptomatic, <50% confined to bed  Filed Vitals:   08/16/15 1143  BP: 126/62  Pulse: 111  Temp: 97.7 F (36.5 C)  Resp: 20   Filed Weights   08/16/15 1143  Weight: 195 lb 9.6 oz (88.724 kg)    GENERAL:alert, no distress and comfortable SKIN: skin color, texture, turgor are normal, no rashes or significant lesions EYES: normal, Conjunctiva are pink and non-injected, sclera clear OROPHARYNX:no exudate, no erythema and lips, buccal mucosa, and tongue normal  NECK: supple, thyroid normal size, non-tender, without nodularity LYMPH:  no palpable  lymphadenopathy in the cervical, axillary or inguinal LUNGS: clear to auscultation and percussion with normal breathing effort HEART: regular rate & rhythm and no murmurs and no lower extremity edema ABDOMEN:abdomen soft, non-tender and normal bowel sounds Musculoskeletal:no cyanosis of digits and no clubbing  NEURO: alert & oriented x 3 with fluent speech, no focal motor/sensory deficits  LABORATORY DATA:  I have reviewed the data as listed CBC Latest Ref Rng 08/12/2015 06/13/2015 06/12/2015  WBC 4.0 - 10.5 K/uL 8.7 6.6 6.5  Hemoglobin 12.0 - 15.0 g/dL 11.6(L) 9.1(L) 9.2(L)  Hematocrit 36.0 - 46.0 % 36.8 28.2(L) 28.6(L)  Platelets 150 - 400 K/uL 473(H) 358 334     CMP Latest Ref Rng 08/12/2015 06/13/2015 06/12/2015  Glucose 65 - 99 mg/dL 109(H) 113(H) 118(H)  BUN 6 - 20 mg/dL $Remove'13 14 6  'DwFEsuT$ Creatinine 0.44 - 1.00 mg/dL 0.71 0.47 0.53  Sodium 135 - 145 mmol/L 138 136 137  Potassium 3.5 - 5.1 mmol/L 4.1 4.0 3.8  Chloride 101 - 111 mmol/L 101 103 108  CO2 22 - 32 mmol/L $RemoveB'27 26 24  'hcsrYoWa$ Calcium 8.9 -  10.3 mg/dL 9.0 8.6(L) 8.6(L)  Total Protein 6.5 - 8.1 g/dL 7.1 - -  Total Bilirubin 0.3 - 1.2 mg/dL 0.4 - -  Alkaline Phos 38 - 126 U/L 68 - -  AST 15 - 41 U/L 17 - -  ALT 14 - 54 U/L 10(L) - -   PATHOLOGY REPORT  Path report:  Accession: [id number] Received: 06/08/2015 Alphonsa Overall DOB: Jul 13, 1946 Age: 74 Gender: F Reported: 06/10/2015 [address] PORT OF SURGICAL PATHOLOGY FINAL DIAGNOSIS Diagnosis Colon, segmental resection for tumor, right colon and terminal ileum - INVASIVE WELL DIFFERENTIATED ADENOCARCINOMA WITH ABUNDANT EXTRACELLULAR MUCIN, MEASURING 9 CM IN GREATEST DIMENSION. - TUMOR INVADES THROUGH UNDERLYING MUSCULARIS MUCOSA TO INVOLVE SEROSAL SURFACE. - FOUR EXTRAMURAL SATELLITE TUMOR DEPOSITS ARE IDENTIFIED. - MARGINS ARE NEGATIVE. - THREE OF TWENTY-NINE LYMPH NODES ARE POSITIVE FOR METASTATIC CARCINOMA (3/29). - SEE ONCOLOGY TEMPLATE.  Microscopic Comment COLON AND RECTUM  (INCLUDING TRANS-ANAL RESECTION): Specimen: Right colon and terminal ileum. Procedure: Laparoscopic assisted right partial colectomy with resection of terminal ileum. Tumor site: Proximal right colon. Specimen integrity: Intact. Macroscopic tumor perforation: Not identified. Invasive tumor: Maximum size: 9 cm. Histologic type(s): Invasive adenocarcinoma. Histologic grade and differentiation: G1: well differentiated/low grade. Type of polyp in which invasive carcinoma arose: Precursor polyp is not identified. Microscopic extension of invasive tumor: Tumor invades through muscularis propria to involve serosa. Lymph-Vascular invasion: Definitive lymph/vascular invasion is not identified; However, several lymph nodes are positive and satellite tumor deposits are present (see below). Peri-neural invasion: Not identified. Tumor deposit(s) (discontinuous extramural extension): Yes, four extramural satellite tumor deposits are present. Resection margins: Proximal margin: Negative. Distal margin: Negative. Circumferential (radial) (posterior ascending, posterior descending; lateral and posterior mid-rectum; and entire lower 1/3 rectum): Negative.  FINAL for Clontz, Destynie V [phone])  Microscopic Comment(continued) Mesenteric margin (sigmoid and transverse): Not applicable. Distance closest margin (if all above margins negative): The primary invasive tumor is 2 cm from the radial resection margin at closest approach. Treatment effect (neo-adjuvant therapy): Not applicable. Additional polyp(s): Two additional tubular adenomas without high grade dysplasia are identified. Non-neoplastic findings: Nonspecific small bowel erosion / ulceration; fibrous obliteration of the appendix; benign omental nodule with fat necrosis, giant cell reaction, and associated foreign material. Lymph nodes: number examined - 29; number positive: 3. Pathologic Staging: pT4a, pN1b. Ancillary studies: Per colorectal  tumor protocol, the tumor will be sent for MMR testing and MSI testing by PCR. (RAH:gt, 06/10/15)  Willeen Niece MD Pathologist, Electronic Signature (Case signed 06/10/2015)   RADIOGRAPHIC STUDIES: I have personally reviewed the radiological images as listed and agreed with the findings in the report.  CT renal stone study 08/12/2015  IMPRESSION: Peritoneal carcinomatosis with omental caking appearing worst in the right lower quadrant and an associated moderate volume of abdominal and pelvic ascites.  Status post resection of the distal ileum and ascending colon without evidence complication.   ASSESSMENT & PLAN: 69 year old Caucasian female, with mental retardation, lives in a group home, who was diagnosed with stage IIIB right colon cancer, status post surgical resection.  1. 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  -I reviewed the options of biopsy to confirm the metastasis, versus observation and a more palliative approach.  Given her mental dilation and rectal comorbidities, she is likely not a good candidate for chemotherapy. We discussed the goal of treatment would be palliation, with a focus on the cord of her life.  -If her ascites increases, I would recommend paracentesis for symptom management and cytology for diagnosis.  -We also discussed the option of hospice. However she cannot stay in her group home if she intervals to hospice. For the practical reason, and her relatively not very symptomatic, we decided to hold off hospice referral for now.  -Palliative chemotherapy option, such as single  agent Xeloda was also discussed with her family, I do not think she is a good candidate, and I do not recommend it. Her family members voiced good understanding, and agrees with the plan.  -I give her a prescription of Vicodin today  -I encouraged her to seek nutrition supplement, such as initial. Her group home will supply it.  -I will see her back in 3 weeks for follow up, and symptom management.    All probable questions were answered. The patient knows to call the clinic with any problems, questions or concerns. No barriers to learning was detected. I spent 30 minutes counseling the patient face to face. The total time spent in the appointment was 40 minutes and more than 50% was on counseling and review of test results     Truitt Merle, MD 08/16/2015 11:52 AM

## 2015-08-16 NOTE — Progress Notes (Signed)
Checked in new pt with no financial concerns prior to seeing the dr.  Abbott Fox has 2 insurances so financial assistance may not be needed but she has Shauna's card for any billing questions or concerns.

## 2015-08-23 ENCOUNTER — Emergency Department (HOSPITAL_COMMUNITY): Payer: Medicare Other

## 2015-08-23 ENCOUNTER — Inpatient Hospital Stay (HOSPITAL_COMMUNITY)
Admission: EM | Admit: 2015-08-23 | Discharge: 2015-08-27 | DRG: 175 | Disposition: A | Discharge status: Skilled Nursing Facility | Payer: Medicare Other | Attending: Internal Medicine | Admitting: Internal Medicine

## 2015-08-23 ENCOUNTER — Encounter (HOSPITAL_COMMUNITY): Payer: Self-pay

## 2015-08-23 DIAGNOSIS — E785 Hyperlipidemia, unspecified: Secondary | ICD-10-CM | POA: Diagnosis present

## 2015-08-23 DIAGNOSIS — R188 Other ascites: Secondary | ICD-10-CM | POA: Diagnosis present

## 2015-08-23 DIAGNOSIS — C786 Secondary malignant neoplasm of retroperitoneum and peritoneum: Secondary | ICD-10-CM | POA: Diagnosis present

## 2015-08-23 DIAGNOSIS — M1990 Unspecified osteoarthritis, unspecified site: Secondary | ICD-10-CM | POA: Diagnosis present

## 2015-08-23 DIAGNOSIS — E780 Pure hypercholesterolemia: Secondary | ICD-10-CM | POA: Diagnosis present

## 2015-08-23 DIAGNOSIS — I2699 Other pulmonary embolism without acute cor pulmonale: Secondary | ICD-10-CM | POA: Diagnosis present

## 2015-08-23 DIAGNOSIS — M810 Age-related osteoporosis without current pathological fracture: Secondary | ICD-10-CM | POA: Diagnosis present

## 2015-08-23 DIAGNOSIS — Z7401 Bed confinement status: Secondary | ICD-10-CM

## 2015-08-23 DIAGNOSIS — C189 Malignant neoplasm of colon, unspecified: Secondary | ICD-10-CM | POA: Diagnosis present

## 2015-08-23 DIAGNOSIS — Z515 Encounter for palliative care: Secondary | ICD-10-CM | POA: Insufficient documentation

## 2015-08-23 DIAGNOSIS — Z9049 Acquired absence of other specified parts of digestive tract: Secondary | ICD-10-CM | POA: Diagnosis present

## 2015-08-23 DIAGNOSIS — F79 Unspecified intellectual disabilities: Secondary | ICD-10-CM | POA: Diagnosis present

## 2015-08-23 DIAGNOSIS — Z79899 Other long term (current) drug therapy: Secondary | ICD-10-CM | POA: Diagnosis not present

## 2015-08-23 DIAGNOSIS — R0602 Shortness of breath: Secondary | ICD-10-CM | POA: Diagnosis present

## 2015-08-23 DIAGNOSIS — I10 Essential (primary) hypertension: Secondary | ICD-10-CM | POA: Diagnosis present

## 2015-08-23 DIAGNOSIS — E43 Unspecified severe protein-calorie malnutrition: Secondary | ICD-10-CM | POA: Diagnosis present

## 2015-08-23 DIAGNOSIS — Z66 Do not resuscitate: Secondary | ICD-10-CM | POA: Diagnosis present

## 2015-08-23 DIAGNOSIS — Z6831 Body mass index (BMI) 31.0-31.9, adult: Secondary | ICD-10-CM | POA: Diagnosis not present

## 2015-08-23 DIAGNOSIS — R1011 Right upper quadrant pain: Secondary | ICD-10-CM

## 2015-08-23 LAB — CBC WITH DIFFERENTIAL/PLATELET
BASOS ABS: 0 10*3/uL (ref 0.0–0.1)
BASOS PCT: 0 % (ref 0–1)
EOS PCT: 0 % (ref 0–5)
Eosinophils Absolute: 0 10*3/uL (ref 0.0–0.7)
HCT: 38.3 % (ref 36.0–46.0)
Hemoglobin: 12.2 g/dL (ref 12.0–15.0)
Lymphocytes Relative: 11 % — ABNORMAL LOW (ref 12–46)
Lymphs Abs: 1 10*3/uL (ref 0.7–4.0)
MCH: 27.5 pg (ref 26.0–34.0)
MCHC: 31.9 g/dL (ref 30.0–36.0)
MCV: 86.5 fL (ref 78.0–100.0)
MONO ABS: 0.6 10*3/uL (ref 0.1–1.0)
MONOS PCT: 6 % (ref 3–12)
Neutro Abs: 7.6 10*3/uL (ref 1.7–7.7)
Neutrophils Relative %: 83 % — ABNORMAL HIGH (ref 43–77)
PLATELETS: 483 10*3/uL — AB (ref 150–400)
RBC: 4.43 MIL/uL (ref 3.87–5.11)
RDW: 14.7 % (ref 11.5–15.5)
WBC: 9.2 10*3/uL (ref 4.0–10.5)

## 2015-08-23 LAB — PROTIME-INR
INR: 1.17 (ref 0.00–1.49)
PROTHROMBIN TIME: 15.1 s (ref 11.6–15.2)

## 2015-08-23 LAB — COMPREHENSIVE METABOLIC PANEL
ALBUMIN: 2.6 g/dL — AB (ref 3.5–5.0)
ALK PHOS: 76 U/L (ref 38–126)
ALT: 10 U/L — ABNORMAL LOW (ref 14–54)
ANION GAP: 9 (ref 5–15)
AST: 20 U/L (ref 15–41)
BILIRUBIN TOTAL: 0.2 mg/dL — AB (ref 0.3–1.2)
BUN: 11 mg/dL (ref 6–20)
CALCIUM: 8.9 mg/dL (ref 8.9–10.3)
CO2: 27 mmol/L (ref 22–32)
Chloride: 99 mmol/L — ABNORMAL LOW (ref 101–111)
Creatinine, Ser: 0.72 mg/dL (ref 0.44–1.00)
GFR calc non Af Amer: >60 mL/min (ref >60)
GLUCOSE: 115 mg/dL — AB (ref 65–99)
POTASSIUM: 4.6 mmol/L (ref 3.5–5.1)
Sodium: 135 mmol/L (ref 135–145)
TOTAL PROTEIN: 6.8 g/dL (ref 6.5–8.1)

## 2015-08-23 LAB — MRSA PCR SCREENING: MRSA BY PCR: NEGATIVE

## 2015-08-23 LAB — TROPONIN I

## 2015-08-23 LAB — I-STAT CG4 LACTIC ACID, ED: Lactic Acid, Venous: 1.67 mmol/L (ref 0.5–2.0)

## 2015-08-23 LAB — HEPARIN LEVEL (UNFRACTIONATED): Heparin Unfractionated: 0.31 IU/mL (ref 0.30–0.70)

## 2015-08-23 LAB — I-STAT TROPONIN, ED: TROPONIN I, POC: 0 ng/mL (ref 0.00–0.08)

## 2015-08-23 LAB — APTT: APTT: 34 s (ref 24–37)

## 2015-08-23 MED ORDER — IOHEXOL 350 MG/ML SOLN
100.0000 mL | Freq: Once | INTRAVENOUS | Status: AC | PRN
Start: 2015-08-23 — End: 2015-08-23
  Administered 2015-08-23: 100 mL via INTRAVENOUS

## 2015-08-23 MED ORDER — FELBAMATE 600 MG/5ML PO SUSP
1200.0000 mg | Freq: Two times a day (BID) | ORAL | Status: DC
Start: 2015-08-23 — End: 2015-08-27
  Administered 2015-08-23 – 2015-08-26 (×6): 1200 mg via ORAL
  Filled 2015-08-23 (×11): qty 10

## 2015-08-23 MED ORDER — HEPARIN (PORCINE) IN NACL 100-0.45 UNIT/ML-% IJ SOLN
1300.0000 [IU]/h | INTRAMUSCULAR | Status: DC
  Administered 2015-08-23: 1300 [IU]/h via INTRAVENOUS
  Filled 2015-08-23: qty 250

## 2015-08-23 MED ORDER — HEPARIN BOLUS VIA INFUSION
4000.0000 [IU] | Freq: Once | INTRAVENOUS | Status: AC
Start: 2015-08-23 — End: 2015-08-23
  Administered 2015-08-23: 4000 [IU] via INTRAVENOUS
  Filled 2015-08-23: qty 4000

## 2015-08-23 MED ORDER — SODIUM CHLORIDE 0.9 % IV BOLUS (SEPSIS)
1000.0000 mL | Freq: Once | INTRAVENOUS | Status: AC
Start: 2015-08-23 — End: 2015-08-23
  Administered 2015-08-23: 1000 mL via INTRAVENOUS

## 2015-08-23 MED ORDER — ATORVASTATIN CALCIUM 10 MG PO TABS
10.0000 mg | ORAL_TABLET | Freq: Every day | ORAL | Status: DC
  Administered 2015-08-23 – 2015-08-26 (×4): 10 mg via ORAL
  Filled 2015-08-23 (×4): qty 1

## 2015-08-23 MED ORDER — CALCIUM CARBONATE-VITAMIN D 500-200 MG-UNIT PO TABS
1.0000 | ORAL_TABLET | Freq: Two times a day (BID) | ORAL | Status: DC
  Administered 2015-08-23 – 2015-08-26 (×6): 1 via ORAL
  Filled 2015-08-23 (×8): qty 1

## 2015-08-23 MED ORDER — FERROUS SULFATE 325 (65 FE) MG PO TABS
325.0000 mg | ORAL_TABLET | Freq: Two times a day (BID) | ORAL | Status: DC
Start: 2015-08-23 — End: 2015-08-27
  Administered 2015-08-23 – 2015-08-27 (×8): 325 mg via ORAL
  Filled 2015-08-23 (×8): qty 1

## 2015-08-23 MED ORDER — LACTULOSE 10 GM/15ML PO SOLN
10.0000 g | Freq: Two times a day (BID) | ORAL | Status: DC
  Administered 2015-08-24 – 2015-08-27 (×6): 10 g via ORAL
  Filled 2015-08-23 (×4): qty 30
  Filled 2015-08-23: qty 15
  Filled 2015-08-23 (×4): qty 30

## 2015-08-23 MED ORDER — FELBAMATE 600 MG PO TABS: 1200.0000 mg | ORAL_TABLET | Freq: Two times a day (BID) | ORAL | Status: DC

## 2015-08-23 MED ORDER — METOPROLOL TARTRATE 25 MG PO TABS
12.5000 mg | ORAL_TABLET | Freq: Two times a day (BID) | ORAL | Status: DC
  Administered 2015-08-23 – 2015-08-25 (×3): 12.5 mg via ORAL
  Filled 2015-08-23 (×4): qty 1

## 2015-08-23 MED ORDER — BISACODYL 5 MG PO TBEC
5.0000 mg | DELAYED_RELEASE_TABLET | Freq: Two times a day (BID) | ORAL | Status: DC | PRN
  Administered 2015-08-24: 5 mg via ORAL
  Filled 2015-08-23: qty 1

## 2015-08-23 MED ORDER — INFLUENZA VAC SPLIT QUAD 0.5 ML IM SUSY
0.5000 mL | PREFILLED_SYRINGE | INTRAMUSCULAR | Status: DC
  Filled 2015-08-23 (×2): qty 0.5

## 2015-08-23 MED ORDER — HYDROCODONE-ACETAMINOPHEN 5-325 MG PO TABS
1.0000 | ORAL_TABLET | Freq: Four times a day (QID) | ORAL | Status: DC | PRN
  Administered 2015-08-23 – 2015-08-24 (×2): 1 via ORAL
  Filled 2015-08-23 (×2): qty 1

## 2015-08-23 MED ORDER — FENOFIBRATE 54 MG PO TABS
54.0000 mg | ORAL_TABLET | Freq: Every day | ORAL | Status: DC
Start: 2015-08-24 — End: 2015-08-27
  Administered 2015-08-25 – 2015-08-27 (×3): 54 mg via ORAL
  Filled 2015-08-23 (×4): qty 1

## 2015-08-23 MED ORDER — DOCUSATE SODIUM 100 MG PO CAPS
100.0000 mg | ORAL_CAPSULE | Freq: Two times a day (BID) | ORAL | Status: DC
  Administered 2015-08-24 – 2015-08-27 (×6): 100 mg via ORAL
  Filled 2015-08-23 (×7): qty 1

## 2015-08-23 MED ORDER — ENSURE IMMUNE HEALTH PO LIQD: 237.0000 mL | Freq: Every day | ORAL | Status: DC | PRN

## 2015-08-23 MED ORDER — SODIUM CHLORIDE 0.9 % IJ SOLN
3.0000 mL | Freq: Two times a day (BID) | INTRAMUSCULAR | Status: DC
  Administered 2015-08-23 – 2015-08-26 (×4): 3 mL via INTRAVENOUS

## 2015-08-23 MED ORDER — ENSURE ENLIVE PO LIQD
237.0000 mL | Freq: Two times a day (BID) | ORAL | Status: DC
  Administered 2015-08-25 – 2015-08-27 (×6): 237 mL via ORAL

## 2015-08-23 MED ORDER — VITAMIN D3 25 MCG (1000 UNIT) PO TABS
4000.0000 [IU] | ORAL_TABLET | Freq: Every day | ORAL | Status: DC
Start: 2015-08-24 — End: 2015-08-27
  Administered 2015-08-24 – 2015-08-27 (×4): 4000 [IU] via ORAL
  Filled 2015-08-23 (×9): qty 4

## 2015-08-23 MED ORDER — SODIUM CHLORIDE 0.9 % IV SOLN
INTRAVENOUS | Status: DC
  Administered 2015-08-23: 17:00:00 via INTRAVENOUS

## 2015-08-23 NOTE — ED Notes (Signed)
Patient transported to CT 

## 2015-08-23 NOTE — ED Notes (Signed)
Patient is from a group home and staff report SOB. No cough. Patient has a history of colon cancer.

## 2015-08-23 NOTE — ED Provider Notes (Signed)
CSN: [id number]     Arrival date & time 08/23/15  1137 History   First MD Initiated Contact with Patient 08/23/15 1152     Chief Complaint  Patient presents with  . Shortness of Breath  . Tachycardia     (Consider location/radiation/quality/duration/timing/severity/associated sxs/prior Treatment) Patient is a 68 y.o. female presenting with shortness of breath. The history is provided by the patient.  Shortness of Breath Severity:  Moderate Onset quality:  Gradual Duration:  1 day Timing:  Constant Progression:  Worsening Chronicity:  New Context: activity   Relieved by:  Nothing Worsened by:  Nothing tried Ineffective treatments:  None tried Associated symptoms: no chest pain, no cough, no fever, no hemoptysis and no vomiting   Risk factors: hx of cancer     Past Medical History  Diagnosis Date  . Hypercholesteremia   . Constipation   . Osteoporosis   . Seizure   . Edema   . Mental retardation   . SBO (small bowel obstruction) 11/2013, 05/2015    likely due to adhesions  . Colonic mass 05/2015    right, concerning for cancer   Past Surgical History  Procedure Laterality Date  . Cholecystectomy    . Colonoscopy with propofol N/A 06/07/2015    Procedure: COLONOSCOPY WITH PROPOFOL;  Surgeon: [name], MD;  Location: [facility];  Service: Endoscopy;  Laterality: N/A;  . Laparoscopic partial right colectomy N/A 06/08/2015    Procedure: LAPAROSCOPIC PARTIAL COLECTOMY ;WITH RESECTION OF RIGHT TERMINAL ILIUM;  Surgeon: Alphonsa Overall, MD;  Location: [facility];  Service: General;  Laterality: N/A;   Family History  Problem Relation Age of Onset  . Family history unknown: Yes   Social History  Substance Use Topics  . Smoking status: Never Smoker   . Smokeless tobacco: None  . Alcohol Use: No   OB History    No data available     Review of Systems  Constitutional: Negative for fever.  Respiratory: Positive for shortness of breath. Negative for cough and  hemoptysis.   Cardiovascular: Negative for chest pain.  Gastrointestinal: Negative for vomiting.  All other systems reviewed and are negative.     Allergies  Review of patient's allergies indicates no known allergies.  Home Medications   Prior to Admission medications   Medication Sig Start Date End Date Taking? Authorizing Provider  atorvastatin (LIPITOR) 10 MG tablet Take 10 mg by mouth at bedtime.    Yes Historical Provider, MD  bisacodyl (DULCOLAX) 5 MG EC tablet Take 5 mg by mouth 2 (two) times daily as needed for mild constipation or moderate constipation.    Yes Historical Provider, MD  calcium-vitamin D (OSCAL WITH D) 500-200 MG-UNIT per tablet Take 1 tablet by mouth 2 (two) times daily.    Yes Historical Provider, MD  Cholecalciferol (VITAMIN D) 2000 UNITS tablet Take 4,000 Units by mouth daily with breakfast.   Yes Historical Provider, MD  docusate sodium (COLACE) 100 MG capsule Take 100 mg by mouth 2 (two) times daily.   Yes Historical Provider, MD  feeding supplement (ENSURE IMMUNE HEALTH) LIQD Take 237 mLs by mouth daily as needed (nutritional supplement).   Yes Historical Provider, MD  felbamate (FELBATOL) 600 MG tablet Take 1,200 mg by mouth 2 (two) times daily.    Yes Historical Provider, MD  fenofibrate (TRICOR) 145 MG tablet Take 145 mg by mouth every morning.    Yes Historical Provider, MD  ferrous sulfate 325 (65 FE) MG tablet Take 325 mg by  mouth 2 (two) times daily.   Yes Historical Provider, MD  furosemide (LASIX) 20 MG tablet Take 20 mg by mouth every morning.    Yes Historical Provider, MD  HYDROcodone-acetaminophen (NORCO/VICODIN) 5-325 MG per tablet Take 1 tablet by mouth every 6 (six) hours as needed for moderate pain. 08/16/15  Yes Truitt Merle, MD  lactulose, encephalopathy, (CHRONULAC) 10 GM/15ML SOLN Take 10 g by mouth 2 (two) times daily.    Yes Historical Provider, MD  metoprolol tartrate (LOPRESSOR) 25 MG tablet Take 0.5 tablets (12.5 mg total) by mouth 2  (two) times daily. 06/13/15  Yes Eugenie Filler, MD  metroNIDAZOLE (METROGEL) 1 % gel Apply 1 application topically daily. Apply to reddened areas of face   Yes Historical Provider, MD  potassium chloride SA (K-DUR,KLOR-CON) 20 MEQ tablet Take 20 mEq by mouth daily.    Yes Historical Provider, MD   BP 143/82 mmHg  Pulse 102  Temp(Src) 98 F (36.7 C) (Oral)  Resp 18  Ht 5\' 7"  (1.702 m)  Wt 200 lb 13.4 oz (91.1 kg)  BMI 31.45 kg/m2  SpO2 97% Physical Exam  Constitutional: She is oriented to person, place, and time. She appears well-developed and well-nourished. No distress.  HENT:  Head: Normocephalic.  Eyes: Conjunctivae are normal.  Neck: Neck supple. No tracheal deviation present.  Cardiovascular: Regular rhythm and normal heart sounds.  Tachycardia present.   Pulmonary/Chest: Effort normal and breath sounds normal. No respiratory distress.  Abdominal: Soft. She exhibits no distension.  Neurological: She is alert and oriented to person, place, and time.  Skin: Skin is warm and dry.  Psychiatric: She has a normal mood and affect.    ED Course  Procedures (including critical care time)  Emergency Focused Ultrasound Exam Limited Ultrasound of the Heart and Pericardium  Performed and interpreted by Dr. Laneta Simmers Indication: ShOB Multiple views of the heart, pericardium, and IVC are obtained with a multi frequency probe.  Findings: normal contractility, no anechoic fluid, limited IVC collapse, no RV dilitation Interpretation: nml ejection fraction, no pericardial effusion, no depressed CVP, no evidence of acute right heart strain Images archived electronically.  CPT Code: 2053419256   Emergency Focused Ultrasound Exam Limited Thorax   Performed and interpreted by Dr Laneta Simmers Longitudinal view of anterior left and right lung fields in real-time with linear probe. Indication: ShOB Findings: + lung sliding + B lines Interpretation: no evidence of pneumothorax. Images electronically  archived.   CPT code: [phone]   Labs Review Labs Reviewed  CBC WITH DIFFERENTIAL/PLATELET - Abnormal; Notable for the following:    Platelets 483 (*)    Neutrophils Relative % 83 (*)    Lymphocytes Relative 11 (*)    All other components within normal limits  COMPREHENSIVE METABOLIC PANEL - Abnormal; Notable for the following:    Chloride 99 (*)    Glucose, Bld 115 (*)    Albumin 2.6 (*)    ALT 10 (*)    Total Bilirubin 0.2 (*)    All other components within normal limits  MRSA PCR SCREENING  APTT  PROTIME-INR  TROPONIN I  TROPONIN I  TROPONIN I  COMPREHENSIVE METABOLIC PANEL  CBC  HEPARIN LEVEL (UNFRACTIONATED)  I-STAT CG4 LACTIC ACID, ED  Randolm Idol, ED    Imaging Review Dg Chest 2 View  08/23/2015   CLINICAL DATA:  Shortness of breath.  EXAM: CHEST  2 VIEW  COMPARISON:  June 08, 2015  FINDINGS: The heart size and mediastinal contours are within normal limits.  The lung volumes are low. The aorta is tortuous. There is no focal infiltrate, pulmonary edema, or pleural effusion. The visualized skeletal structures are stable.  IMPRESSION: No active cardiopulmonary disease.   Electronically Signed   By: Abelardo Diesel M.D.   On: 08/23/2015 13:04   Ct Angio Chest Pe W/cm &/or Wo Cm  08/23/2015   CLINICAL DATA:  Tachycardia and shortness of breath. History of colon cancer and peritoneal carcinomatosis.  EXAM: CT ANGIOGRAPHY CHEST WITH CONTRAST  TECHNIQUE: Multidetector CT imaging of the chest was performed using the standard protocol during bolus administration of intravenous contrast. Multiplanar CT image reconstructions and MIPs were obtained to evaluate the vascular anatomy.  CONTRAST:  130mL OMNIPAQUE IOHEXOL 350 MG/ML SOLN  COMPARISON:  Chest radiograph 08/23/2015 and abdominal CT 08/12/2015  FINDINGS: Positive for bilateral pulmonary emboli. There is clot in the distal main right pulmonary artery which extends into the right lower lobe and right upper lobe pulmonary arteries.  Segmental clot in the right upper lobe. There is segmental and subsegmental clot in the right lower lobe. Small amount of clot in the left pulmonary arteries. There is a thrombosed segmental branch in the posterior left upper lobe. There is thrombus in the left lower lobe pulmonary artery and additional thrombus in a left lower lobe subsegmental branches. The RV/LV ratio is approximately 0.9.  There is no significant chest lymphadenopathy. No significant pericardial or pleural fluid. There is ascites throughout the upper abdomen. Limited evaluation of the abdominal structures.  Trachea and mainstem bronchi are patent. No significant airspace disease or consolidation in the lungs. Mild volume loss at the lung bases.  No acute bone abnormality.  Review of the MIP images confirms the above findings.  IMPRESSION: Positive for bilateral pulmonary emboli, right side greater than left. CT raises concern for right heart strain (RV/LV Ratio = 0.9) consistent with at least submassive (intermediate risk) PE.  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  Critical Value/emergent results were called by telephone at the time of interpretation on 08/23/2015 at 2:37 pm to Dr. Leo Grosser , who verbally acknowledged these results.   Electronically Signed   By: Markus Daft M.D.   On: 08/23/2015 14:37   I have personally reviewed and evaluated these images and lab results as part of my medical decision-making.   EKG Interpretation   Date/Time:  Monday August 23 2015 11:47:22 EDT Ventricular Rate:  126 PR Interval:  157 QRS Duration: 78 QT Interval:  284 QTC Calculation: 411 R Axis:   41 Text Interpretation:  Sinus tachycardia Low voltage, precordial leads  Confirmed by Kue Fox MD, [place] ([id number]) on 08/23/2015 11:58:43 AM      MDM   Final diagnoses:  Pulmonary embolism, bilateral    69 y.o. 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 She is tachycardic on arrival in a sinus rhythm, has multiple risk factors for PE. CT scan was performed and bilateral large pulmonary emboli were noted. No evidence of acute right heart strain on bedside echo and blood pressure stable so no indication for lytics. Patient was heparinized and will be admitted for monitoring and anticoagulation. Hospitalist was consulted for admission and will see the patient in the emergency department.     Leo Grosser, MD 08/23/15 2152

## 2015-08-23 NOTE — ED Notes (Signed)
Patient comes from a group home via [facility] where she apparently complained of not feeling well.  Staff at day program checked patient's HR and found her to be tachycardic.  Patient also complained of feeling SOB.  On arrival to ED, patient complained of abdominal pain in mid abdomen.  Patient denies N/V.    On exam, patient's lung sounds are clear with no wheezing or crackles.  Patient's hear sounds are WNL, S1/S2, rate rapid in 120's.  Patient has non-pitting edema to bilateral lower extremities.  +2 pedal and radial pulses.  Patient's abdomen is distended, bowel sounds hypoactive.

## 2015-08-23 NOTE — H&P (Addendum)
History and Physical   [name] INO:[id number] DOB: Oct 11, 1946 DOA: 08/23/2015  Referring physician: Dr. Laneta Simmers PCP: Pcp Not In System  Specialists: none   Chief Complaint: shortness of breath   HPI: [name] is a 69 y.o. female has a past medical history significant for colon cancer with peritoneal carcinomatosis followed by Dr. Burr Medico As an outpatient, currently not getting any treatment that she is a poor candidate, she also has a history of MR living in a group home, hypertension, hyperlipidemia, presents to the emergency room with a chief complaint of "not feeling good" and reports that shortness of breath. Patient is mostly noncommunicative, she can tell me "breath" when I am asking what's wrong but cannot contribute much to the story. The caregiver from the group home is in the ED, and other than the reported shortness of breath she cannot contribute much to the story as well. In the ED, patient was found to be tachycardic, she is normotensive and is satting well on room air, a CT angiogram of the chest was done which showed bilateral pulmonary embolism. TRH was asked for admission for PE.  Review of Systems: Unable to obtain review of system due to underlying MR  Past Medical History  Diagnosis Date  . Hypercholesteremia   . Constipation   . Osteoporosis   . Seizure   . Edema   . Mental retardation   . SBO (small bowel obstruction) 11/2013, 05/2015    likely due to adhesions  . Colonic mass 05/2015    right, concerning for cancer   Past Surgical History  Procedure Laterality Date  . Cholecystectomy    . Colonoscopy with propofol N/A 06/07/2015    Procedure: COLONOSCOPY WITH PROPOFOL;  Surgeon: [name], MD;  Location: [facility];  Service: Endoscopy;  Laterality: N/A;  . Laparoscopic partial right colectomy N/A 06/08/2015    Procedure: LAPAROSCOPIC PARTIAL COLECTOMY ;WITH RESECTION OF RIGHT TERMINAL ILIUM;  Surgeon: Alphonsa Overall, MD;  Location: [facility];  Service:  General;  Laterality: N/A;   Social History:  reports that she has never smoked. She does not have any smokeless tobacco history on file. She reports that she does not drink alcohol or use illicit drugs.  No Known Allergies  Family History  Problem Relation Age of Onset  . Family history unknown: Yes    Prior to Admission medications   Medication Sig Start Date End Date Taking? Authorizing Provider  atorvastatin (LIPITOR) 10 MG tablet Take 10 mg by mouth at bedtime.    Yes Historical Provider, MD  bisacodyl (DULCOLAX) 5 MG EC tablet Take 5 mg by mouth 2 (two) times daily as needed for mild constipation or moderate constipation.    Yes Historical Provider, MD  calcium-vitamin D (OSCAL WITH D) 500-200 MG-UNIT per tablet Take 1 tablet by mouth 2 (two) times daily.    Yes Historical Provider, MD  Cholecalciferol (VITAMIN D) 2000 UNITS tablet Take 4,000 Units by mouth daily with breakfast.   Yes Historical Provider, MD  docusate sodium (COLACE) 100 MG capsule Take 100 mg by mouth 2 (two) times daily.   Yes Historical Provider, MD  feeding supplement (ENSURE IMMUNE HEALTH) LIQD Take 237 mLs by mouth daily as needed (nutritional supplement).   Yes Historical Provider, MD  felbamate (FELBATOL) 600 MG tablet Take 1,200 mg by mouth 2 (two) times daily.    Yes Historical Provider, MD  fenofibrate (TRICOR) 145 MG tablet Take 145 mg by mouth every morning.  Yes Historical Provider, MD  ferrous sulfate 325 (65 FE) MG tablet Take 325 mg by mouth 2 (two) times daily.   Yes Historical Provider, MD  furosemide (LASIX) 20 MG tablet Take 20 mg by mouth every morning.    Yes Historical Provider, MD  HYDROcodone-acetaminophen (NORCO/VICODIN) 5-325 MG per tablet Take 1 tablet by mouth every 6 (six) hours as needed for moderate pain. 08/16/15  Yes Truitt Merle, MD  lactulose, encephalopathy, (CHRONULAC) 10 GM/15ML SOLN Take 10 g by mouth 2 (two) times daily.    Yes Historical Provider, MD  metoprolol tartrate  (LOPRESSOR) 25 MG tablet Take 0.5 tablets (12.5 mg total) by mouth 2 (two) times daily. 06/13/15  Yes Eugenie Filler, MD  metroNIDAZOLE (METROGEL) 1 % gel Apply 1 application topically daily. Apply to reddened areas of face   Yes Historical Provider, MD  potassium chloride SA (K-DUR,KLOR-CON) 20 MEQ tablet Take 20 mEq by mouth daily.    Yes Historical Provider, MD   Physical Exam: Filed Vitals:   08/23/15 1200 08/23/15 1223 08/23/15 1323 08/23/15 1439  BP: 105/69 104/65 117/86   Pulse: 126 122 127   Temp:      TempSrc:      Resp: 19 20 25    Weight:    92.08 kg (203 lb)  SpO2: 96% 98% 96%      GENERAL: NAD  HEENT: head NCAT, no scleral icterus. Pupils round and reactive. Mucous membranes are moist.   NECK: Supple. No carotid bruits.   LUNGS: Clear to auscultation. No wheezing or crackles  HEART: Regular rate and rhythm without murmur. 2+ pulses, no JVD, trace peripheral edema. She is tachycardic  ABDOMEN: Soft, nontender, and distended. Positive bowel sounds. Appears to have ascites   EXTREMITIES: Without any clubbing, rash, lesions  NEUROLOGIC: Alert and oriented x1. Strength 5/5 in all 4.  SKIN: No ulceration or induration present.   Labs on Admission:  Basic Metabolic Panel:  Recent Labs Lab 08/23/15 1214  NA 135  K 4.6  CL 99*  CO2 27  GLUCOSE 115*  BUN 11  CREATININE 0.72  CALCIUM 8.9   Liver Function Tests:  Recent Labs Lab 08/23/15 1214  AST 20  ALT 10*  ALKPHOS 76  BILITOT 0.2*  PROT 6.8  ALBUMIN 2.6*   CBC:  Recent Labs Lab 08/23/15 1214  WBC 9.2  NEUTROABS 7.6  HGB 12.2  HCT 38.3  MCV 86.5  PLT 483*   Radiological Exams on Admission: Dg Chest 2 View  08/23/2015   CLINICAL DATA:  Shortness of breath.  EXAM: CHEST  2 VIEW  COMPARISON:  June 08, 2015  FINDINGS: The heart size and mediastinal contours are within normal limits. The lung volumes are low. The aorta is tortuous. There is no focal infiltrate, pulmonary edema, or pleural  effusion. The visualized skeletal structures are stable.  IMPRESSION: No active cardiopulmonary disease.   Electronically Signed   By: Abelardo Diesel M.D.   On: 08/23/2015 13:04   Ct Angio Chest Pe W/cm &/or Wo Cm  08/23/2015   CLINICAL DATA:  Tachycardia and shortness of breath. History of colon cancer and peritoneal carcinomatosis.  EXAM: CT ANGIOGRAPHY CHEST WITH CONTRAST  TECHNIQUE: Multidetector CT imaging of the chest was performed using the standard protocol during bolus administration of intravenous contrast. Multiplanar CT image reconstructions and MIPs were obtained to evaluate the vascular anatomy.  CONTRAST:  137mL OMNIPAQUE IOHEXOL 350 MG/ML SOLN  COMPARISON:  Chest radiograph 08/23/2015 and abdominal CT 08/12/2015  FINDINGS: Positive for bilateral pulmonary emboli. There is clot in the distal main right pulmonary artery which extends into the right lower lobe and right upper lobe pulmonary arteries. Segmental clot in the right upper lobe. There is segmental and subsegmental clot in the right lower lobe. Small amount of clot in the left pulmonary arteries. There is a thrombosed segmental branch in the posterior left upper lobe. There is thrombus in the left lower lobe pulmonary artery and additional thrombus in a left lower lobe subsegmental branches. The RV/LV ratio is approximately 0.9.  There is no significant chest lymphadenopathy. No significant pericardial or pleural fluid. There is ascites throughout the upper abdomen. Limited evaluation of the abdominal structures.  Trachea and mainstem bronchi are patent. No significant airspace disease or consolidation in the lungs. Mild volume loss at the lung bases.  No acute bone abnormality.  Review of the MIP images confirms the above findings.  IMPRESSION: Positive for bilateral pulmonary emboli, right side greater than left. CT raises concern for right heart strain (RV/LV Ratio = 0.9) consistent with at least submassive (intermediate risk) PE.   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  Critical Value/emergent results were called by telephone at the time of interpretation on 08/23/2015 at 2:37 pm to Dr. Leo Grosser , who verbally acknowledged these results.   Electronically Signed   By: Markus Daft M.D.   On: 08/23/2015 14:37   EKG: Independently reviewed. Sinus tachycardia   Assessment/Plan Principal Problem:   Pulmonary embolism Active Problems:   Mental retardation   HLD (hyperlipidemia)   Protein-calorie malnutrition, severe   Colon cancer   Essential hypertension   Bilateral pulmonary embolism - Patient will be admitted to step down, she will be started on a heparin infusion. CT scan with evidence of heart strain, obtain 2-D echo  - this is likely provoked in the setting of known malignancy  - Closely monitor her respiratory status and heart rate  - She has been tachycardic for a while as seen in outpatient notes so it is not completely new - she is normotensive, on room air - Discussed with Dr. Burr Medico, if she remains stable without bleeding on a heparin drip will transition to Lovenox and query the group home whether this could be done over there  Colon cancer with peritoneal carcinomatosis  - She has ascites on exam, she looks distended, once things cool down a bit with #1 she may benefit from paracentesis  - She is currently not getting chemotherapy  - Patient would likely benefit from a palliative care consult at this point, will discuss with sister in person in the morning. I did talk to her sister over the phone, overall prognosis is poor. She expressed understanding. She wishes for the patient to be DO NOT RESUSCITATE.   Hyperlipidemia  - Resume her home medications   Hypertension - hold Lasix, continue low-dose metoprolol given elevated heart rate and to prevent rebound worsening tachycardia  MR - appears at baseline  Severe protein calorie malnutrition  Diet: soft Fluids: NS DVT  Prophylaxis: hepatin gtt  Code Status: DNR  Family Communication: d/w POA, sister [name] (cell [phone]) Disposition Plan: admit to [place]. Cruzita Lederer, MD [facility] Pager [phone]  If 7PM-7AM, please contact night-coverage [email or web address] Password [facility] 08/23/2015, 3:44 PM

## 2015-08-23 NOTE — Progress Notes (Signed)
ANTICOAGULATION CONSULT NOTE - Initial Consult  Pharmacy Consult for Heparin Indication: Pulmonary Embolism  No Known Allergies  Patient Measurements: Weight: 203 lb (92.08 kg) Heparin Dosing Weight: 75.5kg  Vital Signs: Temp: 97.9 F (36.6 C) (08/29 1141) Temp Source: Oral (08/29 1141) BP: 117/86 mmHg (08/29 1323) Pulse Rate: 127 (08/29 1323)  Labs:  Recent Labs  08/23/15 1214  HGB 12.2  HCT 38.3  PLT 483*  CREATININE 0.72    Estimated Creatinine Clearance: 72 mL/min (by C-G formula based on Cr of 0.72).   Medical History: Past Medical History  Diagnosis Date  . Hypercholesteremia   . Constipation   . Osteoporosis   . Seizure   . Edema   . Mental retardation   . SBO (small bowel obstruction) 11/2013, 05/2015    likely due to adhesions  . Colonic mass 05/2015    right, concerning for cancer    Assessment: 79 yoF with PMHx peritoneal carcinomatosis 2/2 colon cancer presents from group home with tachycardia, SOB, and abdominal pain.  CT shows bilateral large pulmonary emboli with concern for right heart strain (none noted on bedside ultrasound).  Pharmacy consulted to start IV heparin.    8/29: No anticoagulants or antiplatelets noted PTA.  Hgb / Hct WNL, platelets high.  SCr stable with CrCl ~ 72 ml/min.  Heparin dosing weight = 75.5kg.   Goal of Therapy:  Heparin level 0.3-0.7 units/ml Monitor platelets by anticoagulation protocol: Yes   Plan:  Start IV heparin 4000 units bolus x 1, then start infusion heparin infusion 1300 units/hr.  F/u 8 hour heparin level, baseline PT/INR, aPTT, renal fxn, s/sxs bleeding  Ralene Bathe, PharmD, BCPS 08/23/2015, 3:04 PM  Pager: [phone]

## 2015-08-24 ENCOUNTER — Inpatient Hospital Stay (HOSPITAL_COMMUNITY): Payer: Medicare Other

## 2015-08-24 DIAGNOSIS — Z515 Encounter for palliative care: Secondary | ICD-10-CM

## 2015-08-24 DIAGNOSIS — I10 Essential (primary) hypertension: Secondary | ICD-10-CM

## 2015-08-24 DIAGNOSIS — I2699 Other pulmonary embolism without acute cor pulmonale: Secondary | ICD-10-CM

## 2015-08-24 LAB — COMPREHENSIVE METABOLIC PANEL
ALK PHOS: 64 U/L (ref 38–126)
ALT: 9 U/L — ABNORMAL LOW (ref 14–54)
AST: 17 U/L (ref 15–41)
Albumin: 2.1 g/dL — ABNORMAL LOW (ref 3.5–5.0)
Anion gap: 7 (ref 5–15)
BILIRUBIN TOTAL: 0.4 mg/dL (ref 0.3–1.2)
BUN: 10 mg/dL (ref 6–20)
CALCIUM: 8.3 mg/dL — AB (ref 8.9–10.3)
CO2: 26 mmol/L (ref 22–32)
Chloride: 102 mmol/L (ref 101–111)
Creatinine, Ser: 0.54 mg/dL (ref 0.44–1.00)
GFR calc Af Amer: >60 mL/min (ref >60)
Glucose, Bld: 115 mg/dL — ABNORMAL HIGH (ref 65–99)
POTASSIUM: 3.9 mmol/L (ref 3.5–5.1)
Sodium: 135 mmol/L (ref 135–145)
TOTAL PROTEIN: 5.5 g/dL — AB (ref 6.5–8.1)

## 2015-08-24 LAB — CBC
HEMATOCRIT: 33.3 % — AB (ref 36.0–46.0)
Hemoglobin: 10.4 g/dL — ABNORMAL LOW (ref 12.0–15.0)
MCH: 26.8 pg (ref 26.0–34.0)
MCHC: 31.2 g/dL (ref 30.0–36.0)
MCV: 85.8 fL (ref 78.0–100.0)
PLATELETS: 434 10*3/uL — AB (ref 150–400)
RBC: 3.88 MIL/uL (ref 3.87–5.11)
RDW: 14.8 % (ref 11.5–15.5)
WBC: 7.6 10*3/uL (ref 4.0–10.5)

## 2015-08-24 LAB — HEPARIN LEVEL (UNFRACTIONATED): HEPARIN UNFRACTIONATED: 0.36 [IU]/mL (ref 0.30–0.70)

## 2015-08-24 LAB — TROPONIN I: Troponin I: <0.03 ng/mL (ref <0.031)

## 2015-08-24 MED ORDER — HEPARIN (PORCINE) IN NACL 100-0.45 UNIT/ML-% IJ SOLN
1400.0000 [IU]/h | INTRAMUSCULAR | Status: DC
  Administered 2015-08-24: 1400 [IU]/h via INTRAVENOUS
  Filled 2015-08-24 (×3): qty 250

## 2015-08-24 MED ORDER — MORPHINE SULFATE (PF) 2 MG/ML IV SOLN: 1.0000 mg | INTRAVENOUS | Status: DC | PRN

## 2015-08-24 MED ORDER — LORAZEPAM 2 MG/ML IJ SOLN
1.0000 mg | Freq: Once | INTRAMUSCULAR | Status: AC
  Administered 2015-08-24: 1 mg via INTRAVENOUS
  Filled 2015-08-24: qty 1

## 2015-08-24 MED ORDER — ENOXAPARIN SODIUM 150 MG/ML ~~LOC~~ SOLN
1.5000 mg/kg | SUBCUTANEOUS | Status: DC
  Administered 2015-08-24 – 2015-08-26 (×3): 135 mg via SUBCUTANEOUS
  Filled 2015-08-24 (×5): qty 1

## 2015-08-24 MED ORDER — HYDROCODONE-ACETAMINOPHEN 5-325 MG PO TABS
1.0000 | ORAL_TABLET | ORAL | Status: DC | PRN
  Administered 2015-08-24: 2 via ORAL
  Administered 2015-08-24: 1 via ORAL
  Administered 2015-08-25 (×2): 2 via ORAL
  Filled 2015-08-24: qty 2
  Filled 2015-08-24: qty 1
  Filled 2015-08-24 (×2): qty 2

## 2015-08-24 MED ORDER — ONDANSETRON HCL 4 MG/2ML IJ SOLN
4.0000 mg | Freq: Four times a day (QID) | INTRAMUSCULAR | Status: DC | PRN
  Administered 2015-08-24: 4 mg via INTRAVENOUS
  Filled 2015-08-24: qty 2

## 2015-08-24 MED ORDER — SODIUM CHLORIDE 0.9 % IV SOLN
INTRAVENOUS | Status: DC
Start: 2015-08-24 — End: 2015-08-25
  Administered 2015-08-24 – 2015-08-25 (×2): via INTRAVENOUS

## 2015-08-24 NOTE — Progress Notes (Signed)
PROGRESS NOTE  [name] GDJ:[id number] DOB: 1946/10/31 DOA: 08/23/2015 PCP: Pcp Not In System  HPI: 69 y.o. female has a past medical history significant for colon cancer with peritoneal carcinomatosis followed by Dr. Burr Medico As an outpatient, currently not getting any treatment that she is a poor candidate, she also has a history of MR living in a group home, hypertension, hyperlipidemia, admitted on 8/29 with dyspnea and found to have bilateral PE.  Subjective / 24 H Interval events - states that she is short of breath, cannot tell me much more.  Assessment/Plan: Principal Problem:   Pulmonary embolism Active Problems:   Mental retardation   HLD (hyperlipidemia)   Protein-calorie malnutrition, severe   Colon cancer   Essential hypertension   Bilateral pulmonary embolism - 2D echo without right heart strain, normal EF - continue heparin, will ask group home if they do Lovenox injections prior to D/C  Colon cancer with peritoneal carcinomatosis  - She is currently not getting chemotherapy, not a candidate per oncology  - I have consulted palliative care this morning for GOC  Hyperlipidemia  - continue her home medications   Hypertension - hold Lasix, continue low-dose metoprolol given elevated heart rate and to prevent rebound worsening tachycardia  MR - appears at baseline  Severe protein calorie malnutrition   Diet: DIET SOFT Room service appropriate?: Yes; Fluid consistency:: Thin Fluids: NS  DVT Prophylaxis: heparin infusion  Code Status: DNR Family Communication: d/w sister bedside  Disposition Plan: transfer to telemetry   Consultants:  Discussed with Dr. Burr Medico over the phone on admission  Palliative care  Procedures:  None    Antibiotics  Anti-infectives    None       Studies  Dg Chest 2 View  08/23/2015   CLINICAL DATA:  Shortness of breath.  EXAM: CHEST  2 VIEW  COMPARISON:  June 08, 2015  FINDINGS: The heart size and mediastinal  contours are within normal limits. The lung volumes are low. The aorta is tortuous. There is no focal infiltrate, pulmonary edema, or pleural effusion. The visualized skeletal structures are stable.  IMPRESSION: No active cardiopulmonary disease.   Electronically Signed   By: Abelardo Diesel M.D.   On: 08/23/2015 13:04   Ct Angio Chest Pe W/cm &/or Wo Cm  08/23/2015   CLINICAL DATA:  Tachycardia and shortness of breath. History of colon cancer and peritoneal carcinomatosis.  EXAM: CT ANGIOGRAPHY CHEST WITH CONTRAST  TECHNIQUE: Multidetector CT imaging of the chest was performed using the standard protocol during bolus administration of intravenous contrast. Multiplanar CT image reconstructions and MIPs were obtained to evaluate the vascular anatomy.  CONTRAST:  146mL OMNIPAQUE IOHEXOL 350 MG/ML SOLN  COMPARISON:  Chest radiograph 08/23/2015 and abdominal CT 08/12/2015  FINDINGS: Positive for bilateral pulmonary emboli. There is clot in the distal main right pulmonary artery which extends into the right lower lobe and right upper lobe pulmonary arteries. Segmental clot in the right upper lobe. There is segmental and subsegmental clot in the right lower lobe. Small amount of clot in the left pulmonary arteries. There is a thrombosed segmental branch in the posterior left upper lobe. There is thrombus in the left lower lobe pulmonary artery and additional thrombus in a left lower lobe subsegmental branches. The RV/LV ratio is approximately 0.9.  There is no significant chest lymphadenopathy. No significant pericardial or pleural fluid. There is ascites throughout the upper abdomen. Limited evaluation of the abdominal structures.  Trachea and mainstem bronchi are patent. No  significant airspace disease or consolidation in the lungs. Mild volume loss at the lung bases.  No acute bone abnormality.  Review of the MIP images confirms the above findings.  IMPRESSION: Positive for bilateral pulmonary emboli, right side  greater than left. CT raises concern for right heart strain (RV/LV Ratio = 0.9) consistent with at least submassive (intermediate risk) PE.  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  Critical Value/emergent results were called by telephone at the time of interpretation on 08/23/2015 at 2:37 pm to Dr. Leo Grosser , who verbally acknowledged these results.   Electronically Signed   By: Markus Daft M.D.   On: 08/23/2015 14:37    Objective  Filed Vitals:   08/24/15 0324 08/24/15 0800 08/24/15 0830 08/24/15 1000  BP:   93/57 112/73  Pulse:      Temp:  98.2 F (36.8 C)    TempSrc:  Oral    Resp: 15  24 27   Height:      Weight:      SpO2: 96%  94% 93%    Intake/Output Summary (Last 24 hours) at 08/24/15 1124 Last data filed at 08/24/15 1000  Gross per 24 hour  Intake 1280.74 ml  Output    550 ml  Net 730.74 ml   Filed Weights   08/23/15 1141 08/23/15 1439 08/23/15 1713  Weight: 88.451 kg (195 lb) 92.08 kg (203 lb) 91.1 kg (200 lb 13.4 oz)    Exam:  GENERAL: NAD  HEENT: head NCAT, no scleral icterus.  LUNGS: Clear to auscultation. No wheezing or crackles  HEART: Regular rate and rhythm without murmur. 2+ pulses, no JVD, no peripheral edema  ABDOMEN: Soft, nontender, and nondistended. Positive bowel sounds.   EXTREMITIES: Without any cyanosis, clubbing, rash, lesions or edema.  NEUROLOGIC: non focal  Data Reviewed: Basic Metabolic Panel:  Recent Labs Lab 08/23/15 1214 08/24/15 0340  NA 135 135  K 4.6 3.9  CL 99* 102  CO2 27 26  GLUCOSE 115* 115*  BUN 11 10  CREATININE 0.72 0.54  CALCIUM 8.9 8.3*   Liver Function Tests:  Recent Labs Lab 08/23/15 1214 08/24/15 0340  AST 20 17  ALT 10* 9*  ALKPHOS 76 64  BILITOT 0.2* 0.4  PROT 6.8 5.5*  ALBUMIN 2.6* 2.1*   CBC:  Recent Labs Lab 08/23/15 1214 08/24/15 0340  WBC 9.2 7.6  NEUTROABS 7.6  --   HGB 12.2 10.4*  HCT 38.3 33.3*  MCV 86.5 85.8  PLT 483* 434*   Cardiac  Enzymes:  Recent Labs Lab 08/23/15 1621 08/23/15 2300 08/24/15 0340  TROPONINI <0.03 <0.03 <0.03    Recent Results (from the past 240 hour(s))  MRSA PCR Screening     Status: None   Collection Time: 08/23/15  4:48 PM  Result Value Ref Range Status   MRSA by PCR NEGATIVE NEGATIVE Final    Comment:        The GeneXpert MRSA Assay (FDA approved for NASAL specimens only), is one component of a comprehensive MRSA colonization surveillance program. It is not intended to diagnose MRSA infection nor to guide or monitor treatment for MRSA infections.      Scheduled Meds: . atorvastatin  10 mg Oral QHS  . calcium-vitamin D  1 tablet Oral BID  . cholecalciferol  4,000 Units Oral Q breakfast  . docusate sodium  100 mg Oral BID  . feeding supplement (ENSURE ENLIVE)  237 mL Oral BID BM  . felbamate  1,200 mg Oral Q12H  .  fenofibrate  54 mg Oral Daily  . ferrous sulfate  325 mg Oral BID WC  . Influenza vac split quadrivalent PF  0.5 mL Intramuscular Tomorrow-1000  . lactulose  10 g Oral BID  . metoprolol tartrate  12.5 mg Oral BID  . sodium chloride  3 mL Intravenous Q12H   Continuous Infusions: . sodium chloride Stopped (08/24/15 0821)  . sodium chloride 75 mL/hr at 08/24/15 0821  . heparin 1,400 Units/hr (08/24/15 0601)    Marzetta Board, MD [facility] Pager [phone]. If 7 PM - 7 AM, please contact night-coverage at [email or web address], password [facility] 08/24/2015, 11:24 AM  LOS: 1 day

## 2015-08-24 NOTE — Consult Note (Signed)
Consultation Note Date: 08/24/2015   Patient Name: [name]  DOB: 1946/05/02  MRN: [id number]  Age / Sex: 69 y.o., female   PCP: Pcp Not In System Referring Physician: Caren Griffins, MD  Reason for Consultation: Disposition and Establishing goals of care  Palliative Care Assessment and Plan Summary of Established Goals of Care and Medical Treatment Preferences  69 y.o. female has a past medical history significant for colon cancer with peritoneal carcinomatosis followed by Dr. Burr Medico As an outpatient, currently not getting any treatment that she is a poor candidate, she also has a history of MR living in a group home for the past several decades, hypertension, hyperlipidemia, admitted on 8/29 with dyspnea and found to have bilateral PE.  The patient is admitted to step down, she is on Heparin drip. Palliative consult has been placed for goals of care. The patient is resting in bed, she states she is short of breath. She states she is worried about her sister but cannot state her name. The patient is not able to answer questions appropriately. She appears in mild distress, she appears to get frustrated when I ask her questions and she looks away.   Call placed and discussed with HCPOA sister Lelan Pons. She stated that she has severe osteoarthritis and she is essentially bed bound. She is aware of the patient's admission for PE and her underlying colon cancer. She is aware that Hospice was brought up in recent outpatient visit with the patient's oncologist. Discussed with the patient's niece Mearl Latin who was checking in on the patient as well.   Will ask social Engineer, manufacturing consult for facilitating hospice arrangements. The patient lives in a group home. May consider SQ Lovenox if group home is able to accommodate. Discussed with patient' sister that patients with cancer are more prone to developing blood clots. Provided education, active listening and supportive care.   Thank you for the  consult, palliative services will follow along.   Contacts/Participants in Discussion: Primary Decision Maker:  Patient's sister Lelan Pons who is HCPOA   HCPOA: yes  Sister   Code Status/Advance Care Planning:  DNR DNI  Sister Lelan Pons is HCPOA  Symptom Management:   Add low dose IV Morphine PRN for dyspnea.   Palliative Prophylaxis: yes   Additional Recommendations (Limitations, Scope, Preferences):  Goals of care: discussed with HCPOA sister Lelan Pons, discussed about hospice consult. Sw consult placed. It remains to be seen if the patient can be transitioned back to her group home with hospice or if she will need SNF on D/C. She has been at the group home for 20+years.  Psycho-social/Spiritual:   Support System: yes, sister Lelan Pons, other sister, niece and nephew. She does not have any children, no spouse.   Desire for further Chaplaincy support:no  Prognosis: < 6 months  Discharge Planning:  back to group home versus SNF with hospice. consider inpatien   Values: adding hospice services, comfort care  Life limiting illness: colon cancer       Chief Complaint/History of Present Illness: shortness of breath   Primary Diagnoses  Present on Admission:  . Pulmonary embolism . Protein-calorie malnutrition, severe . HLD (hyperlipidemia) . Essential hypertension . Colon cancer  Palliative Review of Systems: Completed.  I have reviewed the medical record, interviewed the patient and family, and examined the patient. The following aspects are pertinent.  Past Medical History  Diagnosis Date  . Hypercholesteremia   . Constipation   . Osteoporosis   . Seizure   .  Edema   . Mental retardation   . SBO (small bowel obstruction) 11/2013, 05/2015    likely due to adhesions  . Colonic mass 05/2015    right, concerning for cancer   Social History   Social History  . Marital Status: Single    Spouse Name: N/A  . Number of Children: N/A  . Years of Education: N/A   Social  History Main Topics  . Smoking status: Never Smoker   . Smokeless tobacco: None  . Alcohol Use: No  . Drug Use: No  . Sexual Activity: No   Other Topics Concern  . None   Social History Narrative   Family History  Problem Relation Age of Onset  . Family history unknown: Yes   Scheduled Meds: . atorvastatin  10 mg Oral QHS  . calcium-vitamin D  1 tablet Oral BID  . cholecalciferol  4,000 Units Oral Q breakfast  . docusate sodium  100 mg Oral BID  . enoxaparin (LOVENOX) injection  1.5 mg/kg Subcutaneous Q24H  . feeding supplement (ENSURE ENLIVE)  237 mL Oral BID BM  . felbamate  1,200 mg Oral Q12H  . fenofibrate  54 mg Oral Daily  . ferrous sulfate  325 mg Oral BID WC  . Influenza vac split quadrivalent PF  0.5 mL Intramuscular Tomorrow-1000  . lactulose  10 g Oral BID  . metoprolol tartrate  12.5 mg Oral BID  . sodium chloride  3 mL Intravenous Q12H   Continuous Infusions: . sodium chloride Stopped (08/24/15 0821)  . sodium chloride 75 mL/hr at 08/24/15 0821   PRN Meds:.bisacodyl, HYDROcodone-acetaminophen Medications Prior to Admission:  Prior to Admission medications   Medication Sig Start Date End Date Taking? Authorizing Provider  atorvastatin (LIPITOR) 10 MG tablet Take 10 mg by mouth at bedtime.    Yes Historical Provider, MD  bisacodyl (DULCOLAX) 5 MG EC tablet Take 5 mg by mouth 2 (two) times daily as needed for mild constipation or moderate constipation.    Yes Historical Provider, MD  calcium-vitamin D (OSCAL WITH D) 500-200 MG-UNIT per tablet Take 1 tablet by mouth 2 (two) times daily.    Yes Historical Provider, MD  Cholecalciferol (VITAMIN D) 2000 UNITS tablet Take 4,000 Units by mouth daily with breakfast.   Yes Historical Provider, MD  docusate sodium (COLACE) 100 MG capsule Take 100 mg by mouth 2 (two) times daily.   Yes Historical Provider, MD  feeding supplement (ENSURE IMMUNE HEALTH) LIQD Take 237 mLs by mouth daily as needed (nutritional supplement).    Yes Historical Provider, MD  felbamate (FELBATOL) 600 MG tablet Take 1,200 mg by mouth 2 (two) times daily.    Yes Historical Provider, MD  fenofibrate (TRICOR) 145 MG tablet Take 145 mg by mouth every morning.    Yes Historical Provider, MD  ferrous sulfate 325 (65 FE) MG tablet Take 325 mg by mouth 2 (two) times daily.   Yes Historical Provider, MD  furosemide (LASIX) 20 MG tablet Take 20 mg by mouth every morning.    Yes Historical Provider, MD  HYDROcodone-acetaminophen (NORCO/VICODIN) 5-325 MG per tablet Take 1 tablet by mouth every 6 (six) hours as needed for moderate pain. 08/16/15  Yes Truitt Merle, MD  lactulose, encephalopathy, (CHRONULAC) 10 GM/15ML SOLN Take 10 g by mouth 2 (two) times daily.    Yes Historical Provider, MD  metoprolol tartrate (LOPRESSOR) 25 MG tablet Take 0.5 tablets (12.5 mg total) by mouth 2 (two) times daily. 06/13/15  Yes Eugenie Filler,  MD  metroNIDAZOLE (METROGEL) 1 % gel Apply 1 application topically daily. Apply to reddened areas of face   Yes Historical Provider, MD  potassium chloride SA (K-DUR,KLOR-CON) 20 MEQ tablet Take 20 mEq by mouth daily.    Yes Historical Provider, MD   No Known Allergies CBC:    Component Value Date/Time   WBC 7.6 08/24/2015 0340   HGB 10.4* 08/24/2015 0340   HCT 33.3* 08/24/2015 0340   PLT 434* 08/24/2015 0340   MCV 85.8 08/24/2015 0340   NEUTROABS 7.6 08/23/2015 1214   LYMPHSABS 1.0 08/23/2015 1214   MONOABS 0.6 08/23/2015 1214   EOSABS 0.0 08/23/2015 1214   BASOSABS 0.0 08/23/2015 1214   Comprehensive Metabolic Panel:    Component Value Date/Time   NA 135 08/24/2015 0340   K 3.9 08/24/2015 0340   CL 102 08/24/2015 0340   CO2 26 08/24/2015 0340   BUN 10 08/24/2015 0340   CREATININE 0.54 08/24/2015 0340   GLUCOSE 115* 08/24/2015 0340   CALCIUM 8.3* 08/24/2015 0340   AST 17 08/24/2015 0340   ALT 9* 08/24/2015 0340   ALKPHOS 64 08/24/2015 0340   BILITOT 0.4 08/24/2015 0340   PROT 5.5* 08/24/2015 0340   ALBUMIN  2.1* 08/24/2015 0340    Physical Exam: Vital Signs: BP 112/73 mmHg  Pulse 102  Temp(Src) 98.1 F (36.7 C) (Oral)  Resp 27  Ht 5\' 7"  (1.702 m)  Wt 91.1 kg (200 lb 13.4 oz)  BMI 31.45 kg/m2  SpO2 93% SpO2: SpO2: 93 % O2 Device: O2 Device: Not Delivered O2 Flow Rate:   Intake/output summary:  Intake/Output Summary (Last 24 hours) at 08/24/15 1513 Last data filed at 08/24/15 1400  Gross per 24 hour  Intake 1636.74 ml  Output    550 ml  Net 1086.74 ml   LBM: Last BM Date: 08/23/15 Baseline Weight: Weight: 88.451 kg (195 lb) Most recent weight: Weight: 91.1 kg (200 lb 13.4 oz)  Exam Findings:  Weak pale elderly lady does not verbalize much Clear anterior lung fields, does not take deep breath s1S2 Abdomen soft Edema 1-2+ bilateral LE Awake alert                       Palliative Performance Scale: 20%  Additional Data Reviewed: Recent Labs     08/23/15  1214  08/24/15  0340  WBC  9.2  7.6  HGB  12.2  10.4*  PLT  483*  434*  NA  135  135  BUN  11  10  CREATININE  0.72  0.54     Time In: 1400  Time Out: 1500 Time Total: 60 min  Greater than 50%  of this time was spent counseling and coordinating care related to the above assessment and plan.  Signed by: Loistine Chance, MD [place], MD  08/24/2015, 3:13 PM  Please contact Palliative Medicine Team phone at [phone] for questions and concerns.

## 2015-08-24 NOTE — Progress Notes (Addendum)
ANTICOAGULATION CONSULT NOTE - f/u Consult  Pharmacy Consult for Heparin --> Lovenox Indication: Pulmonary Embolism  No Known Allergies  Patient Measurements: Height: 5\' 7"  (170.2 cm) Weight: 200 lb 13.4 oz (91.1 kg) IBW/kg (Calculated) : 61.6 Heparin Dosing Weight: 75.5kg  Vital Signs: Temp: 98.2 F (36.8 C) (08/30 0800) Temp Source: Oral (08/30 0800) BP: 110/60 mmHg (08/30 0317)  Labs:  Recent Labs  08/23/15 1214 08/23/15 1216 08/23/15 1621 08/23/15 2300 08/23/15 2330 08/24/15 0340 08/24/15 0845  HGB 12.2  --   --   --   --  10.4*  --   HCT 38.3  --   --   --   --  33.3*  --   PLT 483*  --   --   --   --  434*  --   APTT  --  34  --   --   --   --   --   LABPROT  --  15.1  --   --   --   --   --   INR  --  1.17  --   --   --   --   --   HEPARINUNFRC  --   --   --   --  0.31  --  0.36  CREATININE 0.72  --   --   --   --  0.54  --   TROPONINI  --   --  <0.03 <0.03  --  <0.03  --     Estimated Creatinine Clearance: 75.8 mL/min (by C-G formula based on Cr of 0.54).   Medical History: Past Medical History  Diagnosis Date  . Hypercholesteremia   . Constipation   . Osteoporosis   . Seizure   . Edema   . Mental retardation   . SBO (small bowel obstruction) 11/2013, 05/2015    likely due to adhesions  . Colonic mass 05/2015    right, concerning for cancer    Assessment: 34 yoF with PMHx peritoneal carcinomatosis 2/2 colon cancer presents from group home with tachycardia, SOB, and abdominal pain.  CT shows bilateral large pulmonary emboli.  2D ECHO shows without right heart strain. Pharmacy consulted to start IV heparin.  No anticoagulants or antiplatelets noted PTA.    Heparin level therapeutic at 0.36 with infusion at 1400 units/hr Hgb 10.4, platelets high CrCl ~75 ml/min  Goal of Therapy:  Heparin level 0.3-0.7 units/ml Monitor platelets by anticoagulation protocol: Yes   Plan:   Continue heparin infusion at 1400 units/hr  Recheck HL in 8  hours  Daily HL and CBC while on heparin infusion  F/u transition to Lovenox (recommendation for longterm VTE management in patients with malignancy).    Hershal Coria 08/24/2015, 9:26 AM     ADDENDUM: 08/24/2015 2:22 PM Pharmacy consulted to transition patient to Lovenox.  Weight 91 kg.  CrCl~75 ml/min.  Plan: 1.  Stop heparin infusion. 2.  Start Lovenox 135 (1.5 mg/kg) SQ q24h.  Administer first dose 1 hour after heparin infusion stopped. 3.  F/u SCr and CBC at least q72h.  Hershal Coria, PharmD, BCPS Pager: [phone] 08/24/2015 2:23 PM

## 2015-08-24 NOTE — Plan of Care (Signed)
Problem: ICU Phase Progression Outcomes Goal: Voiding-avoid urinary catheter unless indicated Outcome: Not Progressing Urinary cath placed last night due to inability to void

## 2015-08-24 NOTE — Progress Notes (Signed)
Nutrition Brief Note  Patient identified on the Malnutrition Screening Tool (MST) Report  Wt Readings from Last 15 Encounters:  08/23/15 200 lb 13.4 oz (91.1 kg)  08/16/15 195 lb 9.6 oz (88.724 kg)  06/13/15 183 lb 4.8 oz (83.144 kg)  05/06/14 201 lb 3.2 oz (91.264 kg)  12/13/13 203 lb 12.8 oz (92.443 kg)  11/29/11 211 lb (95.709 kg)    Body mass index is 31.45 kg/(m^2). Patient meets criteria for obesity based on current BMI.   Pt seen for MST. Weight has been trending up since June 2016 and has been stable since May 2015. Pt states she had coffee, orange juice, and eggs for breakfast this AM. Visualized Ensure Enlive (order in place for supplement BID) on bedside table with ~50% completion. Pt denies abdominal pain or nausea this AM. No muscle or fat wasting noted during physical assessment but did note moderate edema to BLE.   Current diet order is Soft, patient is consuming approximately 100% of meals at this time. Labs and medications reviewed.   No nutrition interventions warranted at this time. If nutrition issues arise, please consult RD.      Jarome Matin, RD, LDN Inpatient Clinical Dietitian Pager # [phone] After hours/weekend pager # [phone]

## 2015-08-24 NOTE — Progress Notes (Signed)
  Echocardiogram 2D Echocardiogram has been performed.  [name] 08/24/2015, 11:19 AM

## 2015-08-24 NOTE — Progress Notes (Signed)
ANTICOAGULATION CONSULT NOTE - f/u Consult  Pharmacy Consult for Heparin Indication: Pulmonary Embolism  No Known Allergies  Patient Measurements: Height: 5\' 7"  (170.2 cm) Weight: 200 lb 13.4 oz (91.1 kg) IBW/kg (Calculated) : 61.6 Heparin Dosing Weight: 75.5kg  Vital Signs: Temp: 97.9 F (36.6 C) (08/29 2323) Temp Source: Oral (08/29 2323) BP: 109/65 mmHg (08/29 2323) Pulse Rate: 102 (08/29 1700)  Labs:  Recent Labs  08/23/15 1214 08/23/15 1216 08/23/15 1621 08/23/15 2300 08/23/15 2330  HGB 12.2  --   --   --   --   HCT 38.3  --   --   --   --   PLT 483*  --   --   --   --   APTT  --  34  --   --   --   LABPROT  --  15.1  --   --   --   INR  --  1.17  --   --   --   HEPARINUNFRC  --   --   --   --  0.31  CREATININE 0.72  --   --   --   --   TROPONINI  --   --  <0.03 <0.03  --     Estimated Creatinine Clearance: 75.8 mL/min (by C-G formula based on Cr of 0.72).   Medical History: Past Medical History  Diagnosis Date  . Hypercholesteremia   . Constipation   . Osteoporosis   . Seizure   . Edema   . Mental retardation   . SBO (small bowel obstruction) 11/2013, 05/2015    likely due to adhesions  . Colonic mass 05/2015    right, concerning for cancer    Assessment: 60 yoF with PMHx peritoneal carcinomatosis 2/2 colon cancer presents from group home with tachycardia, SOB, and abdominal pain.  CT shows bilateral large pulmonary emboli with concern for right heart strain (none noted on bedside ultrasound).  Pharmacy consulted to start IV heparin.    8/29: No anticoagulants or antiplatelets noted PTA.  Hgb / Hct WNL, platelets high.  SCr stable with CrCl ~ 72 ml/min.  Heparin dosing weight = 75.5kg.  Today, 08/24/15  2330 HL=0.31, no problems per RN  Goal of Therapy:  Heparin level 0.3-0.7 units/ml Monitor platelets by anticoagulation protocol: Yes   Plan:   Increase heparin drip to 1400 units/hr  Recheck HL in 8 hours  Dorrene German 08/24/2015,  12:15 AM

## 2015-08-24 NOTE — Progress Notes (Signed)
Notified MD Gherghe of pts HR in mid to high 140s, currently trying to have. Pts normal is 120s-130s. MD aware, currently not concerned. Notified central telemetry.  08/24/2015 Rosine Beat, RN

## 2015-08-25 DIAGNOSIS — C189 Malignant neoplasm of colon, unspecified: Secondary | ICD-10-CM

## 2015-08-25 DIAGNOSIS — I2699 Other pulmonary embolism without acute cor pulmonale: Principal | ICD-10-CM

## 2015-08-25 LAB — CREATININE, SERUM
CREATININE: 0.62 mg/dL (ref 0.44–1.00)
GFR calc Af Amer: >60 mL/min (ref >60)
GFR calc non Af Amer: >60 mL/min (ref >60)

## 2015-08-25 LAB — CBC
HCT: 34.5 % — ABNORMAL LOW (ref 36.0–46.0)
Hemoglobin: 11.1 g/dL — ABNORMAL LOW (ref 12.0–15.0)
MCH: 27.9 pg (ref 26.0–34.0)
MCHC: 32.2 g/dL (ref 30.0–36.0)
MCV: 86.7 fL (ref 78.0–100.0)
PLATELETS: 411 10*3/uL — AB (ref 150–400)
RBC: 3.98 MIL/uL (ref 3.87–5.11)
RDW: 14.9 % (ref 11.5–15.5)
WBC: 8 10*3/uL (ref 4.0–10.5)

## 2015-08-25 MED ORDER — MORPHINE SULFATE (PF) 2 MG/ML IV SOLN
2.0000 mg | INTRAVENOUS | Status: DC | PRN
  Filled 2015-08-25: qty 1

## 2015-08-25 MED ORDER — METOPROLOL TARTRATE 25 MG PO TABS
25.0000 mg | ORAL_TABLET | Freq: Two times a day (BID) | ORAL | Status: DC
  Administered 2015-08-25 – 2015-08-27 (×4): 25 mg via ORAL
  Filled 2015-08-25 (×4): qty 1

## 2015-08-25 MED ORDER — FUROSEMIDE 20 MG PO TABS
20.0000 mg | ORAL_TABLET | Freq: Every day | ORAL | Status: DC
  Administered 2015-08-25 – 2015-08-27 (×3): 20 mg via ORAL
  Filled 2015-08-25 (×3): qty 1

## 2015-08-25 MED ORDER — LORAZEPAM 1 MG PO TABS
1.0000 mg | ORAL_TABLET | Freq: Once | ORAL | Status: AC
  Administered 2015-08-25: 1 mg via ORAL
  Filled 2015-08-25: qty 1

## 2015-08-25 MED ORDER — HYDROCODONE-ACETAMINOPHEN 5-325 MG PO TABS
2.0000 | ORAL_TABLET | ORAL | Status: DC | PRN
  Administered 2015-08-25 – 2015-08-27 (×7): 2 via ORAL
  Filled 2015-08-25 (×7): qty 2

## 2015-08-25 MED ORDER — VITAMINS A & D EX OINT
TOPICAL_OINTMENT | CUTANEOUS | Status: AC
  Administered 2015-08-25: 1
  Filled 2015-08-25: qty 10

## 2015-08-25 NOTE — Clinical Social Work Note (Signed)
Clinical Social Work Assessment  Patient Details  Name: [name] MRN: [id number] Date of Birth: 28-Mar-1946  Date of referral:  08/25/15               Reason for consult:  Facility Placement                Permission sought to share information with:  Facility Art therapist granted to share information::  Yes, Verbal Permission Granted  Name::        Agency::     Relationship::     Contact Information:     Housing/Transportation Living arrangements for the past 2 months:  Group Home Source of Information:   (sister, [name] via phone) Patient Interpreter Needed:  None Criminal Activity/Legal Involvement Pertinent to Current Situation/Hospitalization:  No - Comment as needed Significant Relationships:  Siblings Lives with:  Facility Resident Do you feel safe going back to the place where you live?  No Need for family participation in patient care:  Yes (Comment)  Care giving concerns:  CSW received consult that patient was admitted from [facility] in [place].    Social Worker assessment / plan:  CSW spoke with Jacquelyne Balint at [facility], who states that they do not feel comfortable taking patient back at discharge as she will need increased care at discharge.   Employment status:    Forensic scientist:  [place]icare, [place] PT Recommendations:  Not assessed at this time Information / Referral to community resources:  [place]  Patient/Family's Response to care:  CSW spoke with patient's sister, [name] who is aware & accepting of plan to go to SNF at discharge. CSW faxed information out to [facility] and will follow-up with bed offers in the morning. CSW made [facility] with [facility] aware that patient would need to go to SNF under Palliative Care Services until [facility] can be switched to [place].   Patient/Family's Understanding of and Emotional Response to Diagnosis,  Current Treatment, and Prognosis:  Patient's sister is accepting of patient's prognosis and seems to be coping appropriately.   Emotional Assessment Appearance:  Appears stated age Attitude/Demeanor/Rapport:    Affect (typically observed):  Pleasant, Quiet Orientation:  Oriented to Self, Oriented to Place Alcohol / Substance use:    Psych involvement (Current and /or in the community):     Discharge Needs  Concerns to be addressed:    Readmission within the last 30 days:    Current discharge risk:    Barriers to Discharge:      Standley Brooking, LCSW 08/25/2015, 4:32 PM

## 2015-08-25 NOTE — Care Management Important Message (Signed)
Important Message  Patient Details  Name: [name] MRN: [id number] Date of Birth: Jun 05, 1946   Medicare Important Message Given:  [facility] notification given    Camillo Flaming 08/25/2015, 12:55 [place] Message  Patient Details  Name: [name] MRN: [id number] Date of Birth: [birth year]   Medicare Important Message Given:  Yes-second notification given    Camillo Flaming 08/25/2015, 12:55 PM

## 2015-08-25 NOTE — Clinical Social Work Placement (Signed)
   CLINICAL SOCIAL WORK PLACEMENT  NOTE  Date:  08/25/2015  Patient Details  Name: [name] MRN: [id number] Date of Birth: 1946-08-13  Clinical Social Work is seeking post-discharge placement for this patient at the [place] level of care (*CSW will initial, date and re-position this form in  chart as items are completed):  Yes   Patient/family provided with [facility]'s list of facilities offering this level of care within the geographic area requested by the patient (or if unable, by the patient's family).  Yes   Patient/family informed of their freedom to choose among providers that offer the needed level of care, that participate in Medicare, Medicaid or managed care program needed by the patient, have an available bed and are willing to accept the patient.  Yes   Patient/family informed of [facility]'s ownership interest in [facility] and [facility], as well as of the fact that they are under no obligation to receive care at these facilities.  PASRR submitted to EDS on 08/25/15     PASRR number received on       Existing PASRR number confirmed on       FL2 transmitted to all facilities in geographic area requested by pt/family on 08/25/15     FL2 transmitted to all facilities within larger geographic area on       Patient informed that his/her managed care company has contracts with or will negotiate with certain facilities, including the following:            Patient/family informed of bed offers received.  Patient chooses bed at       Physician recommends and patient chooses bed at      Patient to be transferred to   on  .  Patient to be transferred to facility by       Patient family notified on   of transfer.  Name of family member notified:        PHYSICIAN       Additional Comment:    _______________________________________________ Standley Brooking, LCSW 08/25/2015, 4:35 PM

## 2015-08-25 NOTE — Progress Notes (Signed)
PROGRESS NOTE  [name] JOI:[id number] DOB: 1946/05/30 DOA: 08/23/2015 PCP: Pcp Not In System  HPI: 69 y.o. female has a past medical history significant for colon cancer with peritoneal carcinomatosis followed by Dr. Burr Medico As an outpatient, currently not getting any treatment that she is a poor candidate, she also has a history of MR living in a group home, hypertension, hyperlipidemia, admitted on 8/29 with dyspnea and found to have bilateral PE.  Subjective / 24 H Interval events - reports worsening pain in the arms. Wants foley out. Wants the telemetry box out.   Assessment/Plan: Principal Problem:   Pulmonary embolism Active Problems:   Mental retardation   HLD (hyperlipidemia)   Protein-calorie malnutrition, severe   Colon cancer   Essential hypertension   Encounter for palliative care   Bilateral pulmonary embolism - 2D echo without right heart strain, normal EF - continue heparin,change to Lovenox injections prior to D/C  Colon cancer with peritoneal carcinomatosis  - She is currently not getting chemotherapy, not a candidate per oncology  -consulted palliative care for [place], recommend hospice at group home vs SNF with hospice.   Hyperlipidemia  - continue her home medications   Hypertension -  Better controlled, will resume her on low dose lasix.  continue low-dose metoprolol given elevated heart rate and to prevent rebound worsening tachycardia  MR - appears at baseline  Severe protein calorie malnutrition Resume ensure supplements.    Diet: DIET SOFT Room service appropriate?: Yes; Fluid consistency:: Thin Fluids: NS  DVT Prophylaxis: heparin infusion  Code Status: DNR Family Communication: d/w sister bedside  Disposition Plan: d/c to group home in am.   Consultants:  Discussed with Dr. Burr Medico over the phone on admission  Palliative care  Procedures:  None    Antibiotics  Anti-infectives    None       Studies  No results  found.  Objective  Filed Vitals:   08/24/15 1622 08/24/15 2033 08/25/15 0441 08/25/15 1307  BP: 124/79 142/84 111/70 138/80  Pulse: 131 128 117 118  Temp:  99 F (37.2 C) 98.1 F (36.7 C) 97.9 F (36.6 C)  TempSrc:  Oral Oral Oral  Resp:  20 20 18   Height:      Weight:      SpO2:  93% 95% 93%    Intake/Output Summary (Last 24 hours) at 08/25/15 1700 Last data filed at 08/25/15 1424  Gross per 24 hour  Intake    540 ml  Output    300 ml  Net    240 ml   Filed Weights   08/23/15 1141 08/23/15 1439 08/23/15 1713  Weight: 88.451 kg (195 lb) 92.08 kg (203 lb) 91.1 kg (200 lb 13.4 oz)    Exam:  GENERAL: NAD  HEENT: head NCAT, no scleral icterus.  LUNGS: Clear to auscultation. No wheezing or crackles  HEART: Regular rate and rhythm without murmur. 2+ pulses, no JVD, no peripheral edema  ABDOMEN: Soft, nontender, and nondistended. Positive bowel sounds.   EXTREMITIES: Without any cyanosis, clubbing, rash, lesions or edema.  NEUROLOGIC: non focal  Data Reviewed: Basic Metabolic Panel:  Recent Labs Lab 08/23/15 1214 08/24/15 0340 08/25/15 0435  NA 135 135  --   K 4.6 3.9  --   CL 99* 102  --   CO2 27 26  --   GLUCOSE 115* 115*  --   BUN 11 10  --   CREATININE 0.72 0.54 0.62  CALCIUM 8.9 8.3*  --  Liver Function Tests:  Recent Labs Lab 08/23/15 1214 08/24/15 0340  AST 20 17  ALT 10* 9*  ALKPHOS 76 64  BILITOT 0.2* 0.4  PROT 6.8 5.5*  ALBUMIN 2.6* 2.1*   CBC:  Recent Labs Lab 08/23/15 1214 08/24/15 0340 08/25/15 0435  WBC 9.2 7.6 8.0  NEUTROABS 7.6  --   --   HGB 12.2 10.4* 11.1*  HCT 38.3 33.3* 34.5*  MCV 86.5 85.8 86.7  PLT 483* 434* 411*   Cardiac Enzymes:  Recent Labs Lab 08/23/15 1621 08/23/15 2300 08/24/15 0340  TROPONINI <0.03 <0.03 <0.03    Recent Results (from the past 240 hour(s))  MRSA PCR Screening     Status: None   Collection Time: 08/23/15  4:48 PM  Result Value Ref Range Status   MRSA by PCR NEGATIVE  NEGATIVE Final    Comment:        The GeneXpert MRSA Assay (FDA approved for NASAL specimens only), is one component of a comprehensive MRSA colonization surveillance program. It is not intended to diagnose MRSA infection nor to guide or monitor treatment for MRSA infections.      Scheduled Meds: . atorvastatin  10 mg Oral QHS  . calcium-vitamin D  1 tablet Oral BID  . cholecalciferol  4,000 Units Oral Q breakfast  . docusate sodium  100 mg Oral BID  . enoxaparin (LOVENOX) injection  1.5 mg/kg Subcutaneous Q24H  . feeding supplement (ENSURE ENLIVE)  237 mL Oral BID BM  . felbamate  1,200 mg Oral Q12H  . fenofibrate  54 mg Oral Daily  . ferrous sulfate  325 mg Oral BID WC  . Influenza vac split quadrivalent PF  0.5 mL Intramuscular Tomorrow-1000  . lactulose  10 g Oral BID  . metoprolol tartrate  25 mg Oral BID  . sodium chloride  3 mL Intravenous Q12H   Continuous Infusions: . sodium chloride Stopped (08/24/15 5993)    Hosie Poisson, MD [facility] Pager [phone]. If 7 PM - 7 AM, please contact night-coverage at [email or web address], password [facility] 08/25/2015, 5:00 PM  LOS: 2 days

## 2015-08-25 NOTE — Progress Notes (Signed)
Spoke with pt's sister Lorna Dibble at bedside concerning discharge plans. Mattie states pt will return to [place] with [facility] and that Lelan Pons was [facility]. A call to pt's sister Eliezer Bottom ([phone]) revealed that she selected [facility]. Referral called to [place] [phone]).

## 2015-08-26 ENCOUNTER — Inpatient Hospital Stay (HOSPITAL_COMMUNITY): Payer: Medicare Other

## 2015-08-26 MED ORDER — MORPHINE SULFATE (PF) 2 MG/ML IV SOLN
2.0000 mg | INTRAVENOUS | Status: DC | PRN
  Administered 2015-08-26 (×2): 2 mg via INTRAVENOUS
  Filled 2015-08-26: qty 1

## 2015-08-26 MED ORDER — ENOXAPARIN SODIUM 150 MG/ML ~~LOC~~ SOLN: 1.5000 mg/kg | SUBCUTANEOUS | Status: DC

## 2015-08-26 NOTE — Progress Notes (Signed)
Patient C/O RUQ pain, PRN PO pain med given not effective, patient crying C/O more pain on that RUQ. Dr. Karleen Hampshire notified and PRN morphine given as well, will continue to assess patient.

## 2015-08-26 NOTE — Discharge Summary (Signed)
Physician Discharge Summary  [name] UDJ:[id number] DOB: 1946-03-15 DOA: 08/23/2015  PCP: Pcp Not In System  Admit date: 08/23/2015 Discharge date: 08/26/2015  Time spent: 25 minutes  Recommendations for Outpatient Follow-up:  1. Follow upw ith hospice MD as needed.   Discharge Diagnoses:  Principal Problem:   Pulmonary embolism Active Problems:   Mental retardation   HLD (hyperlipidemia)   Protein-calorie malnutrition, severe   Colon cancer   Essential hypertension   Encounter for palliative care    Diet recommendation: regular diet.   Filed Weights   08/23/15 1141 08/23/15 1439 08/23/15 1713  Weight: 88.451 kg (195 lb) 92.08 kg (203 lb) 91.1 kg (200 lb 13.4 oz)    History of present illness:  69 y.o. female has a past medical history significant for colon cancer with peritoneal carcinomatosis followed by Dr. Burr Medico As an outpatient, currently not getting any treatment that she is a poor candidate, she also has a history of MR living in a group home, hypertension, hyperlipidemia, admitted on 8/29 with dyspnea and found to have bilateral PE. Palliative care consulted and the family/ patient opted for hospice at group home vs SNF with hospice.   Hospital Course:  Bilateral pulmonary embolism - 2D echo without right heart strain, normal EF lovenox on discharge.   Colon cancer with peritoneal carcinomatosis  - She is currently not getting chemotherapy, not a candidate per oncology  -consulted palliative care for [place], recommend hospice at group home vs SNF with hospice.   Hyperlipidemia  - continue her home medications   Hypertension - Better controlled, will resume her on low dose lasix. continue low-dose metoprolol given elevated heart rate and to prevent rebound worsening tachycardia  MR - appears at baseline  Severe protein calorie malnutrition Resume ensure supplements.   Procedures:  none  Consultations: Palliative care Discharge Exam: Filed Vitals:    08/26/15 0534  BP: 141/93  Pulse: 111  Temp: 97.2 F (36.2 C)  Resp: 16    General: alert afebrile comfortable Cardiovascular: s1s2 Respiratory: ctab  Discharge Instructions   Discharge Instructions    Diet general    Complete by:  As directed      Discharge instructions    Complete by:  As directed   Follow up with hospice MD as needed.          Current Discharge Medication List    START taking these medications   Details  enoxaparin (LOVENOX) 150 MG/ML injection Inject 0.91 mLs (135 mg total) into the skin daily. Qty: 0 Syringe      CONTINUE these medications which have NOT CHANGED   Details  atorvastatin (LIPITOR) 10 MG tablet Take 10 mg by mouth at bedtime.     bisacodyl (DULCOLAX) 5 MG EC tablet Take 5 mg by mouth 2 (two) times daily as needed for mild constipation or moderate constipation.     calcium-vitamin D (OSCAL WITH D) 500-200 MG-UNIT per tablet Take 1 tablet by mouth 2 (two) times daily.     Cholecalciferol (VITAMIN D) 2000 UNITS tablet Take 4,000 Units by mouth daily with breakfast.    docusate sodium (COLACE) 100 MG capsule Take 100 mg by mouth 2 (two) times daily.    feeding supplement (ENSURE IMMUNE HEALTH) LIQD Take 237 mLs by mouth daily as needed (nutritional supplement).    felbamate (FELBATOL) 600 MG tablet Take 1,200 mg by mouth 2 (two) times daily.     fenofibrate (TRICOR) 145 MG tablet Take 145 mg by mouth every  morning.     ferrous sulfate 325 (65 FE) MG tablet Take 325 mg by mouth 2 (two) times daily.    furosemide (LASIX) 20 MG tablet Take 20 mg by mouth every morning.     HYDROcodone-acetaminophen (NORCO/VICODIN) 5-325 MG per tablet Take 1 tablet by mouth every 6 (six) hours as needed for moderate pain. Qty: 60 tablet, Refills: 0    lactulose, encephalopathy, (CHRONULAC) 10 GM/15ML SOLN Take 10 g by mouth 2 (two) times daily.     metoprolol tartrate (LOPRESSOR) 25 MG tablet Take 0.5 tablets (12.5 mg total) by mouth 2 (two)  times daily. Qty: 60 tablet, Refills: 0    metroNIDAZOLE (METROGEL) 1 % gel Apply 1 application topically daily. Apply to reddened areas of face    potassium chloride SA (K-DUR,KLOR-CON) 20 MEQ tablet Take 20 mEq by mouth daily.        No Known Allergies    The results of significant diagnostics from this hospitalization (including imaging, microbiology, ancillary and laboratory) are listed below for reference.    Significant Diagnostic Studies: Dg Chest 2 View  08/23/2015   CLINICAL DATA:  Shortness of breath.  EXAM: CHEST  2 VIEW  COMPARISON:  June 08, 2015  FINDINGS: The heart size and mediastinal contours are within normal limits. The lung volumes are low. The aorta is tortuous. There is no focal infiltrate, pulmonary edema, or pleural effusion. The visualized skeletal structures are stable.  IMPRESSION: No active cardiopulmonary disease.   Electronically Signed   By: Abelardo Diesel M.D.   On: 08/23/2015 13:04   Ct Angio Chest Pe W/cm &/or Wo Cm  08/23/2015   CLINICAL DATA:  Tachycardia and shortness of breath. History of colon cancer and peritoneal carcinomatosis.  EXAM: CT ANGIOGRAPHY CHEST WITH CONTRAST  TECHNIQUE: Multidetector CT imaging of the chest was performed using the standard protocol during bolus administration of intravenous contrast. Multiplanar CT image reconstructions and MIPs were obtained to evaluate the vascular anatomy.  CONTRAST:  136mL OMNIPAQUE IOHEXOL 350 MG/ML SOLN  COMPARISON:  Chest radiograph 08/23/2015 and abdominal CT 08/12/2015  FINDINGS: Positive for bilateral pulmonary emboli. There is clot in the distal main right pulmonary artery which extends into the right lower lobe and right upper lobe pulmonary arteries. Segmental clot in the right upper lobe. There is segmental and subsegmental clot in the right lower lobe. Small amount of clot in the left pulmonary arteries. There is a thrombosed segmental branch in the posterior left upper lobe. There is thrombus in  the left lower lobe pulmonary artery and additional thrombus in a left lower lobe subsegmental branches. The RV/LV ratio is approximately 0.9.  There is no significant chest lymphadenopathy. No significant pericardial or pleural fluid. There is ascites throughout the upper abdomen. Limited evaluation of the abdominal structures.  Trachea and mainstem bronchi are patent. No significant airspace disease or consolidation in the lungs. Mild volume loss at the lung bases.  No acute bone abnormality.  Review of the MIP images confirms the above findings.  IMPRESSION: Positive for bilateral pulmonary emboli, right side greater than left. CT raises concern for right heart strain (RV/LV Ratio = 0.9) consistent with at least submassive (intermediate risk) PE.  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  Critical Value/emergent results were called by telephone at the time of interpretation on 08/23/2015 at 2:37 pm to Dr. Leo Grosser , who verbally acknowledged these results.   Electronically Signed   By: Markus Daft M.D.   On: 08/23/2015 14:37  Ct Renal Stone Study  08/12/2015   CLINICAL DATA:  Right upper quadrant pain for 2 months. History of colon cancer. Status post right hemicolectomy and resection of the terminal ileum 06/08/2015. Subsequent encounter.  EXAM: CT ABDOMEN AND PELVIS WITHOUT CONTRAST  TECHNIQUE: Multidetector CT imaging of the abdomen and pelvis was performed following the standard protocol without IV contrast.  COMPARISON:  CT abdomen and pelvis 05/30/2015.  FINDINGS: Trace right pleural effusion is identified. Mild dependent atelectasis is seen. No pericardial effusion.  The patient has a moderate volume of abdominal and pelvic ascites. Omental tumor implants are identified with the largest deposits seen in the right lower quadrant. No focal liver lesion is seen on this uninfused examination. The spleen, adrenal glands, kidneys and pancreas unremarkable.  The patient is status  post resection of the distal ileum and a portion of the ascending colon. There is no small bowel obstruction. The colon is otherwise unremarkable. The stomach is nearly completely decompressed but otherwise unremarkable. Aortoiliac atherosclerosis without aneurysm is identified. A mesenteric lymph node in the left upper quadrant measuring 1.2 cm is seen on image 31.  No lytic or sclerotic bony lesion is identified. Lumbar spondylosis appears most notable at L1-2 and L4-5.  IMPRESSION: Peritoneal carcinomatosis with omental caking appearing worst in the right lower quadrant and an associated moderate volume of abdominal and pelvic ascites.  Status post resection of the distal ileum and ascending colon without evidence complication.   Electronically Signed   By: Inge Rise M.D.   On: 08/12/2015 15:11    Microbiology: Recent Results (from the past 240 hour(s))  MRSA PCR Screening     Status: None   Collection Time: 08/23/15  4:48 PM  Result Value Ref Range Status   MRSA by PCR NEGATIVE NEGATIVE Final    Comment:        The GeneXpert MRSA Assay (FDA approved for NASAL specimens only), is one component of a comprehensive MRSA colonization surveillance program. It is not intended to diagnose MRSA infection nor to guide or monitor treatment for MRSA infections.      Labs: Basic Metabolic Panel:  Recent Labs Lab 08/23/15 1214 08/24/15 0340 08/25/15 0435  NA 135 135  --   K 4.6 3.9  --   CL 99* 102  --   CO2 27 26  --   GLUCOSE 115* 115*  --   BUN 11 10  --   CREATININE 0.72 0.54 0.62  CALCIUM 8.9 8.3*  --    Liver Function Tests:  Recent Labs Lab 08/23/15 1214 08/24/15 0340  AST 20 17  ALT 10* 9*  ALKPHOS 76 64  BILITOT 0.2* 0.4  PROT 6.8 5.5*  ALBUMIN 2.6* 2.1*   No results for input(s): LIPASE, AMYLASE in the last 168 hours. No results for input(s): AMMONIA in the last 168 hours. CBC:  Recent Labs Lab 08/23/15 1214 08/24/15 0340 08/25/15 0435  WBC 9.2 7.6  8.0  NEUTROABS 7.6  --   --   HGB 12.2 10.4* 11.1*  HCT 38.3 33.3* 34.5*  MCV 86.5 85.8 86.7  PLT 483* 434* 411*   Cardiac Enzymes:  Recent Labs Lab 08/23/15 1621 08/23/15 2300 08/24/15 0340  TROPONINI <0.03 <0.03 <0.03   BNP: BNP (last 3 results) No results for input(s): BNP in the last 8760 hours.  ProBNP (last 3 results) No results for input(s): PROBNP in the last 8760 hours.  CBG: No results for input(s): GLUCAP in the last 168 hours.  SignedHosie Poisson  [facility] 08/26/2015, 9:28 AM

## 2015-08-27 NOTE — Progress Notes (Signed)
Brief follow up pharmacy note for enoxaparin for PE Pharmacy consulted to transition from heparin drip to therapeutic enoxaparin on 8/30.  08/27/2015  CBC WNL CrCl ~9mls/min  Continue enoxaparin 1.5mg /kg (135mg ) SQ q24h Pharmacy will sign off  Dolly Rias RPh 08/27/2015, 1:06 PM Pager [phone]

## 2015-08-27 NOTE — Progress Notes (Signed)
Report called to SNF [facility] [place]. All questions of receiving RN answered in full.   Fritz Pickerel, RN

## 2015-08-27 NOTE — Care Management Important Message (Signed)
Important Message  Patient Details  Name: [name] MRN: [id number] Date of Birth: [birth year]   Medicare Important Message Given:  Yes-third notification given    Camillo Flaming 08/27/2015, 11:32 AMImportant Message  Patient Details  Name: [name] MRN: [id number] Date of Birth: 01/05/1946   Medicare Important Message Given:  Yes-third notification given    Camillo Flaming 08/27/2015, 11:32 AM

## 2015-08-27 NOTE — Discharge Summary (Signed)
Physician Discharge Summary  [name] OQH:[id number] DOB: Apr 30, 1946 DOA: 08/23/2015  PCP: Pcp Not In System  Admit date: 08/23/2015 Discharge date: 08/27/2015  Time spent: 25 minutes  Recommendations for Outpatient Follow-up:  1. Follow upw ith hospice MD as needed.   Discharge Diagnoses:  Principal Problem:   Pulmonary embolism Active Problems:   Mental retardation   HLD (hyperlipidemia)   Protein-calorie malnutrition, severe   Colon cancer   Essential hypertension   Encounter for palliative care    Diet recommendation: regular diet.   Filed Weights   08/23/15 1141 08/23/15 1439 08/23/15 1713  Weight: 88.451 kg (195 lb) 92.08 kg (203 lb) 91.1 kg (200 lb 13.4 oz)    History of present illness:  69 y.o. female has a past medical history significant for colon cancer with peritoneal carcinomatosis followed by Dr. Burr Medico As an outpatient, currently not getting any treatment that she is a poor candidate, she also has a history of MR living in a group home, hypertension, hyperlipidemia, admitted on 8/29 with dyspnea and found to have bilateral PE. Palliative care consulted and the family/ patient opted for hospice at group home vs SNF with hospice.   Hospital Course:  Bilateral pulmonary embolism - 2D echo without right heart strain, normal EF lovenox on discharge.   Colon cancer with peritoneal carcinomatosis  - She is currently not getting chemotherapy, not a candidate per oncology  -consulted palliative care for [place], recommend hospice at group home vs SNF with hospice.   Hyperlipidemia  - continue her home medications   Hypertension - Better controlled, will resume her on low dose lasix. continue low-dose metoprolol given elevated heart rate and to prevent rebound worsening tachycardia  MR - appears at baseline   Abdominal pain: US abdomen did not reveal any sig abnormalities.  Resolved after pain meds.   Severe protein calorie malnutrition Resume ensure  supplements.   Procedures:  none  Consultations: Palliative care Discharge Exam: Filed Vitals:   08/27/15 0531  BP: 122/65  Pulse: 111  Temp: 98.9 F (37.2 C)  Resp: 18    General: alert afebrile comfortable Cardiovascular: s1s2 Respiratory: ctab  Discharge Instructions   Discharge Instructions    Diet general    Complete by:  As directed      Diet general    Complete by:  As directed      Discharge instructions    Complete by:  As directed   Follow up with hospice MD as needed.          Current Discharge Medication List    START taking these medications   Details  enoxaparin (LOVENOX) 150 MG/ML injection Inject 0.91 mLs (135 mg total) into the skin daily. Qty: 0 Syringe      CONTINUE these medications which have NOT CHANGED   Details  atorvastatin (LIPITOR) 10 MG tablet Take 10 mg by mouth at bedtime.     bisacodyl (DULCOLAX) 5 MG EC tablet Take 5 mg by mouth 2 (two) times daily as needed for mild constipation or moderate constipation.     calcium-vitamin D (OSCAL WITH D) 500-200 MG-UNIT per tablet Take 1 tablet by mouth 2 (two) times daily.     Cholecalciferol (VITAMIN D) 2000 UNITS tablet Take 4,000 Units by mouth daily with breakfast.    docusate sodium (COLACE) 100 MG capsule Take 100 mg by mouth 2 (two) times daily.    feeding supplement (ENSURE IMMUNE HEALTH) LIQD Take 237 mLs by mouth daily as needed (nutritional  supplement).    felbamate (FELBATOL) 600 MG tablet Take 1,200 mg by mouth 2 (two) times daily.     fenofibrate (TRICOR) 145 MG tablet Take 145 mg by mouth every morning.     ferrous sulfate 325 (65 FE) MG tablet Take 325 mg by mouth 2 (two) times daily.    furosemide (LASIX) 20 MG tablet Take 20 mg by mouth every morning.     HYDROcodone-acetaminophen (NORCO/VICODIN) 5-325 MG per tablet Take 1 tablet by mouth every 6 (six) hours as needed for moderate pain. Qty: 60 tablet, Refills: 0    lactulose, encephalopathy, (CHRONULAC) 10  GM/15ML SOLN Take 10 g by mouth 2 (two) times daily.     metoprolol tartrate (LOPRESSOR) 25 MG tablet Take 0.5 tablets (12.5 mg total) by mouth 2 (two) times daily. Qty: 60 tablet, Refills: 0    metroNIDAZOLE (METROGEL) 1 % gel Apply 1 application topically daily. Apply to reddened areas of face    potassium chloride SA (K-DUR,KLOR-CON) 20 MEQ tablet Take 20 mEq by mouth daily.        No Known Allergies    The results of significant diagnostics from this hospitalization (including imaging, microbiology, ancillary and laboratory) are listed below for reference.    Significant Diagnostic Studies: Dg Chest 2 View  08/23/2015   CLINICAL DATA:  Shortness of breath.  EXAM: CHEST  2 VIEW  COMPARISON:  June 08, 2015  FINDINGS: The heart size and mediastinal contours are within normal limits. The lung volumes are low. The aorta is tortuous. There is no focal infiltrate, pulmonary edema, or pleural effusion. The visualized skeletal structures are stable.  IMPRESSION: No active cardiopulmonary disease.   Electronically Signed   By: Abelardo Diesel M.D.   On: 08/23/2015 13:04   Ct Angio Chest Pe W/cm &/or Wo Cm  08/23/2015   CLINICAL DATA:  Tachycardia and shortness of breath. History of colon cancer and peritoneal carcinomatosis.  EXAM: CT ANGIOGRAPHY CHEST WITH CONTRAST  TECHNIQUE: Multidetector CT imaging of the chest was performed using the standard protocol during bolus administration of intravenous contrast. Multiplanar CT image reconstructions and MIPs were obtained to evaluate the vascular anatomy.  CONTRAST:  188mL OMNIPAQUE IOHEXOL 350 MG/ML SOLN  COMPARISON:  Chest radiograph 08/23/2015 and abdominal CT 08/12/2015  FINDINGS: Positive for bilateral pulmonary emboli. There is clot in the distal main right pulmonary artery which extends into the right lower lobe and right upper lobe pulmonary arteries. Segmental clot in the right upper lobe. There is segmental and subsegmental clot in the right lower  lobe. Small amount of clot in the left pulmonary arteries. There is a thrombosed segmental branch in the posterior left upper lobe. There is thrombus in the left lower lobe pulmonary artery and additional thrombus in a left lower lobe subsegmental branches. The RV/LV ratio is approximately 0.9.  There is no significant chest lymphadenopathy. No significant pericardial or pleural fluid. There is ascites throughout the upper abdomen. Limited evaluation of the abdominal structures.  Trachea and mainstem bronchi are patent. No significant airspace disease or consolidation in the lungs. Mild volume loss at the lung bases.  No acute bone abnormality.  Review of the MIP images confirms the above findings.  IMPRESSION: Positive for bilateral pulmonary emboli, right side greater than left. CT raises concern for right heart strain (RV/LV Ratio = 0.9) consistent with at least submassive (intermediate risk) PE.  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  Critical Value/emergent results were called by telephone at the time  of interpretation on 08/23/2015 at 2:37 pm to Dr. Leo Grosser , who verbally acknowledged these results.   Electronically Signed   By: Markus Daft M.D.   On: 08/23/2015 14:37   US Abdomen Complete  08/26/2015   CLINICAL DATA:  Right upper quadrant pain for 2 days. History of a cholecystectomy. History of a colonic mass.  EXAM: ULTRASOUND ABDOMEN COMPLETE  COMPARISON:  CT, 08/12/2015.  FINDINGS: Gallbladder: Surgically absent  Common bile duct: Diameter: 10 mm, similar to its size on the prior CT. No convincing duct stone.  Liver: Coarsened echotexture. No discrete mass or focal lesion. Hepatopetal flow documented in the portal vein.  IVC: No abnormality visualized.  Pancreas: Not well visualized.  Spleen: Size and appearance within normal limits.  Right Kidney: Length: 10.2 cm. Diffuse cortical thinning. Normal parenchymal echogenicity. No mass or stone. No hydronephrosis.  Left  Kidney: Length: 11.5 cm. Diffuse cortical thinning. Normal parenchymal echogenicity. No mass or stone. No hydronephrosis.  Abdominal aorta: No aneurysm visualized.  Other findings: Small to moderate amount of ascites surrounds the liver and spleen.  IMPRESSION: 1. No acute finding. 2. Common bile duct is dilated to 1 cm, but this is chronic unchanged from prior imaging. 3. Coarsened echotexture of the liver. This may reflect cirrhosis. No liver mass or focal lesion. 4. Bilateral renal cortical thinning. 5. Ascites similar to that seen on the recent prior CT.   Electronically Signed   By: Lajean Manes M.D.   On: 08/26/2015 17:54   Ct Renal Stone Study  08/12/2015   CLINICAL DATA:  Right upper quadrant pain for 2 months. History of colon cancer. Status post right hemicolectomy and resection of the terminal ileum 06/08/2015. Subsequent encounter.  EXAM: CT ABDOMEN AND PELVIS WITHOUT CONTRAST  TECHNIQUE: Multidetector CT imaging of the abdomen and pelvis was performed following the standard protocol without IV contrast.  COMPARISON:  CT abdomen and pelvis 05/30/2015.  FINDINGS: Trace right pleural effusion is identified. Mild dependent atelectasis is seen. No pericardial effusion.  The patient has a moderate volume of abdominal and pelvic ascites. Omental tumor implants are identified with the largest deposits seen in the right lower quadrant. No focal liver lesion is seen on this uninfused examination. The spleen, adrenal glands, kidneys and pancreas unremarkable.  The patient is status post resection of the distal ileum and a portion of the ascending colon. There is no small bowel obstruction. The colon is otherwise unremarkable. The stomach is nearly completely decompressed but otherwise unremarkable. Aortoiliac atherosclerosis without aneurysm is identified. A mesenteric lymph node in the left upper quadrant measuring 1.2 cm is seen on image 31.  No lytic or sclerotic bony lesion is identified. Lumbar  spondylosis appears most notable at L1-2 and L4-5.  IMPRESSION: Peritoneal carcinomatosis with omental caking appearing worst in the right lower quadrant and an associated moderate volume of abdominal and pelvic ascites.  Status post resection of the distal ileum and ascending colon without evidence complication.   Electronically Signed   By: Inge Rise M.D.   On: 08/12/2015 15:11    Microbiology: Recent Results (from the past 240 hour(s))  MRSA PCR Screening     Status: None   Collection Time: 08/23/15  4:48 PM  Result Value Ref Range Status   MRSA by PCR NEGATIVE NEGATIVE Final    Comment:        The GeneXpert MRSA Assay (FDA approved for NASAL specimens only), is one component of a comprehensive MRSA colonization surveillance program. It  is not intended to diagnose MRSA infection nor to guide or monitor treatment for MRSA infections.      Labs: Basic Metabolic Panel:  Recent Labs Lab 08/23/15 1214 08/24/15 0340 08/25/15 0435  NA 135 135  --   K 4.6 3.9  --   CL 99* 102  --   CO2 27 26  --   GLUCOSE 115* 115*  --   BUN 11 10  --   CREATININE 0.72 0.54 0.62  CALCIUM 8.9 8.3*  --    Liver Function Tests:  Recent Labs Lab 08/23/15 1214 08/24/15 0340  AST 20 17  ALT 10* 9*  ALKPHOS 76 64  BILITOT 0.2* 0.4  PROT 6.8 5.5*  ALBUMIN 2.6* 2.1*   No results for input(s): LIPASE, AMYLASE in the last 168 hours. No results for input(s): AMMONIA in the last 168 hours. CBC:  Recent Labs Lab 08/23/15 1214 08/24/15 0340 08/25/15 0435  WBC 9.2 7.6 8.0  NEUTROABS 7.6  --   --   HGB 12.2 10.4* 11.1*  HCT 38.3 33.3* 34.5*  MCV 86.5 85.8 86.7  PLT 483* 434* 411*   Cardiac Enzymes:  Recent Labs Lab 08/23/15 1621 08/23/15 2300 08/24/15 0340  TROPONINI <0.03 <0.03 <0.03   BNP: BNP (last 3 results) No results for input(s): BNP in the last 8760 hours.  ProBNP (last 3 results) No results for input(s): PROBNP in the last 8760 hours.  CBG: No results for  input(s): GLUCAP in the last 168 hours.     SignedHosie Poisson  [facility] 08/27/2015, 1:36 PM

## 2015-08-27 NOTE — Clinical Social Work Placement (Signed)
Patient is set to discharge to [facility] SNF today. Patient & sister, Lelan Pons aware. Discharge packet given to RN, Robert Bellow. [facility] called for transport.     Raynaldo Opitz, [facility] Clinical Social Worker cell #: [phone]    CLINICAL SOCIAL WORK PLACEMENT  NOTE  Date:  08/27/2015  Patient Details  Name: [name] MRN: [id number] Date of Birth: 07/26/1946  Clinical Social Work is seeking post-discharge placement for this patient at the [place] level of care (*CSW will initial, date and re-position this form in  chart as items are completed):  Yes   Patient/family provided with [place] Work Department's list of facilities offering this level of care within the geographic area requested by the patient (or if unable, by the patient's family).  Yes   Patient/family informed of their freedom to choose among providers that offer the needed level of care, that participate in Medicare, Medicaid or managed care program needed by the patient, have an available bed and are willing to accept the patient.  Yes   Patient/family informed of [place]'s ownership interest in [facility] and [facility], as well as of the fact that they are under no obligation to receive care at these facilities.  PASRR submitted to EDS on 08/25/15     PASRR number received on       Existing PASRR number confirmed on       FL2 transmitted to all facilities in geographic area requested by pt/family on 08/25/15     FL2 transmitted to all facilities within larger geographic area on       Patient informed that his/her managed care company has contracts with or will negotiate with certain facilities, including the following:        Yes   Patient/family informed of bed offers received.  Patient chooses bed at [place]     Physician recommends and patient chooses bed at      Patient to be  transferred to [facility] on 08/27/15.  Patient to be transferred to facility by [facility]     Patient family notified on 08/27/15 of transfer.  Name of family member notified:  patient's sister, Lelan Pons via phone     PHYSICIAN       Additional Comment:    _______________________________________________ Standley Brooking, LCSW 08/27/2015, 1:52 PM

## 2015-08-31 ENCOUNTER — Encounter: Payer: Self-pay | Admitting: Adult Health

## 2015-08-31 ENCOUNTER — Non-Acute Institutional Stay (SKILLED_NURSING_FACILITY): Payer: Medicare Other | Admitting: Adult Health

## 2015-08-31 DIAGNOSIS — I10 Essential (primary) hypertension: Secondary | ICD-10-CM | POA: Diagnosis not present

## 2015-08-31 DIAGNOSIS — K5900 Constipation, unspecified: Secondary | ICD-10-CM

## 2015-08-31 DIAGNOSIS — E43 Unspecified severe protein-calorie malnutrition: Secondary | ICD-10-CM

## 2015-08-31 DIAGNOSIS — I2699 Other pulmonary embolism without acute cor pulmonale: Secondary | ICD-10-CM | POA: Diagnosis not present

## 2015-08-31 DIAGNOSIS — R609 Edema, unspecified: Secondary | ICD-10-CM

## 2015-08-31 DIAGNOSIS — E785 Hyperlipidemia, unspecified: Secondary | ICD-10-CM | POA: Diagnosis not present

## 2015-08-31 DIAGNOSIS — K5909 Other constipation: Secondary | ICD-10-CM | POA: Insufficient documentation

## 2015-08-31 DIAGNOSIS — R569 Unspecified convulsions: Secondary | ICD-10-CM | POA: Diagnosis not present

## 2015-08-31 DIAGNOSIS — C189 Malignant neoplasm of colon, unspecified: Secondary | ICD-10-CM | POA: Diagnosis not present

## 2015-08-31 MED ORDER — METOPROLOL TARTRATE 25 MG PO TABS: 25.0000 mg | ORAL_TABLET | Freq: Two times a day (BID) | ORAL | Status: DC

## 2015-08-31 MED ORDER — PRO-STAT PO LIQD: 30.0000 mL | Freq: Two times a day (BID) | ORAL | Status: DC

## 2015-08-31 NOTE — Progress Notes (Signed)
Patient ID: [name], female   DOB: [birth year], 68 y.o.   MRN: [id number]   Facility: [facility]      No Known Allergies  Chief Complaint  Patient presents with  . Hospitalization Follow-up    HPI:  She is a intellectually challenged woman living in a group home with colon cancer. She did have partial right colectomy in June of this year. She has been followed by Dr. Burr Medico; she is not a candidate for further treatment with radiation or chemotherapy. She was hospitalized for bilateral PE. She will be treated with lovenox long term therapy. She will need a palliative care consult. She is unable to participate in the hpi or ros. She is telling that she is wanting to go home.    Past Medical History  Diagnosis Date  . Hypercholesteremia   . Constipation   . Osteoporosis   . Seizure   . Edema   . Mental retardation   . SBO (small bowel obstruction) 11/2013, 05/2015    likely due to adhesions  . Colonic mass 05/2015    right, concerning for cancer    Past Surgical History  Procedure Laterality Date  . Cholecystectomy    . Colonoscopy with propofol N/A 06/07/2015    Procedure: COLONOSCOPY WITH PROPOFOL;  Surgeon: [name], MD;  Location: [facility] ENDOSCOPY;  Service: Endoscopy;  Laterality: N/A;  . Laparoscopic partial right colectomy N/A 06/08/2015    Procedure: LAPAROSCOPIC PARTIAL COLECTOMY ;WITH RESECTION OF RIGHT TERMINAL ILIUM;  Surgeon: Alphonsa Overall, MD;  Location: [facility] ORS;  Service: General;  Laterality: N/A;    VITAL SIGNS BP 132/80 mmHg  Pulse 70  Ht 5\' 7"  (1.702 m)  Wt 200 lb (90.719 kg)  BMI 31.32 kg/m2  Patient's Medications  New Prescriptions   No medications on file  Previous Medications   ATORVASTATIN (LIPITOR) 10 MG TABLET    Take 10 mg by mouth at bedtime.    BISACODYL (DULCOLAX) 5 MG EC TABLET    Take 5 mg by mouth 2 (two) times daily as needed for mild constipation or moderate constipation.    CALCIUM-VITAMIN D (OSCAL WITH D) 500-200  MG-UNIT PER TABLET    Take 1 tablet by mouth 2 (two) times daily.    CHOLECALCIFEROL (VITAMIN D) 2000 UNITS TABLET    Take 4,000 Units by mouth daily with breakfast.   DOCUSATE SODIUM (COLACE) 100 MG CAPSULE    Take 100 mg by mouth 2 (two) times daily.   ENOXAPARIN (LOVENOX) 150 MG/ML INJECTION    Inject 0.91 mLs (135 mg total) into the skin daily.   FELBAMATE (FELBATOL) 600 MG TABLET    Take 1,200 mg by mouth 2 (two) times daily.    FENOFIBRATE (TRICOR) 145 MG TABLET    Take 145 mg by mouth every morning.    FERROUS SULFATE 325 (65 FE) MG TABLET    Take 325 mg by mouth 2 (two) times daily.   FUROSEMIDE (LASIX) 20 MG TABLET    Take 20 mg by mouth every morning.    HYDROCODONE-ACETAMINOPHEN (NORCO/VICODIN) 5-325 MG PER TABLET    Take 1 tablet by mouth every 6 (six) hours as needed for moderate pain.   LACTULOSE, ENCEPHALOPATHY, (CHRONULAC) 10 GM/15ML SOLN    Take 10 g by mouth 2 (two) times daily.    METOPROLOL TARTRATE (LOPRESSOR) 25 MG TABLET    Take 0.5 tablets (12.5 mg total) by mouth 2 (two) times daily.   METRONIDAZOLE (METROGEL) 1 % GEL  Apply 1 application topically daily. Apply to reddened areas of face   POTASSIUM CHLORIDE SA (K-DUR,KLOR-CON) 20 MEQ TABLET    Take 20 mEq by mouth daily.   Modified Medications   No medications on file  Discontinued Medications     SIGNIFICANT DIAGNOSTIC EXAMS  08-12-15: ct renal stone study: Peritoneal carcinomatosis with omental caking appearing worst in the right lower quadrant and an associated moderate volume of abdominal and pelvic ascites. Status post resection of the distal ileum and ascending colon without evidence complication.  08-23-15: chets x-ray: No active cardiopulmonary disease.  08-23-15: ct angio of chest: Positive for bilateral pulmonary emboli, right side greater than left. CT raises concern for right heart strain (RV/LV Ratio = 0.9) consistent with at least submassive (intermediate risk) PE. Abdominal ascites compatible with  known peritoneal carcinomatosis.  08-24-15: TEE: Left ventricle: The cavity size was normal. Systolic function was normal. The estimated ejection fraction was in the range of 60% to 65%. Wall motion was normal; there were no regional wall motion abnormalities. Due to tachycardia, there was fusion of early and atrial contributions to ventricular filling.   LABS REVIEWED:   06-11-15: vit b12: 942; folate 15.5; iron 18; tibc 274 08-23-15: wbc 9.2; hgb 12.2; hct 38.3; mcv 86.5; plt 483 glucose 115; bun 11; creat 0.72; k+ 4.6; na++135; liver normal; albumin 2.6 08-24-15: wbc 7.6; hgb 10.4; hct 33.3; mcv 85.8; plt 434; glucose 115; bun 10; creat 0.54; k+3.9; na++135; liver normal albumin 2.1 08-25-15: wbc 8.0; hgb 11.1; hct 34.5; mcv 86.7; plt 411      Review of Systems  Unable to perform ROS: Other      Physical Exam  Constitutional: No distress.  Obese   Eyes: Conjunctivae are normal.  Neck: Neck supple. No JVD present. No thyromegaly present.  Cardiovascular: Normal rate, regular rhythm and intact distal pulses.   Respiratory: Effort normal and breath sounds normal. No respiratory distress. She has no wheezes.  GI: Soft. Bowel sounds are normal. She exhibits no distension. There is no tenderness.  Musculoskeletal: She exhibits edema.  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  Neurological: She is alert.  Skin: Skin is warm and dry. She is not diaphoretic.  Psychiatric: She has a normal mood and affect.     ASSESSMENT/ PLAN:  1. Bilateral PE: she is presently stable; 02 sats are normal. Is on long term lovenox therapy 1.5 mg/kg every 24 hours; will monitor her status  2. Dyslipidemia: will continue lipitor 10 mg daily and fenofibrate 145 mg daily   3. Colon cancer; is status post right colectomy; will setup a palliative care consult as she is not a candidate for chemotherapy or  radiation therapy; has vicodin 5/325 mg every 6 hours as needed for pain   4. Seizure: no reports of recent seizure activity present; will continue felbamate 1200 mg twice daily and will monitor   5. Anemia: will continue iron twice daily her hgb is 11.1  6. Edema: will continue lasix 20 mg daily with k+ 20 meq daily   7. Hypertension: has tachycardia; will increase her lopressor to 25 mg twice daily and will have nursing check blood pressure and pulse twice daily   8. Chronic constipation: will continue colace twice daily lactulose 15 cc twice daily and dulcolax tabs 5 mg twice daily as needed   9. Protein calorie malnutrition: her albumin is 2.1; will  begin prostat 30 cc twice daily     Ok Edwards NP [facility]  Contact [phone] Monday through Friday 8am- 5pm  After hours call [phone]

## 2015-09-02 ENCOUNTER — Non-Acute Institutional Stay (SKILLED_NURSING_FACILITY): Payer: Medicare Other | Admitting: Internal Medicine

## 2015-09-02 DIAGNOSIS — E43 Unspecified severe protein-calorie malnutrition: Secondary | ICD-10-CM

## 2015-09-02 DIAGNOSIS — K5900 Constipation, unspecified: Secondary | ICD-10-CM | POA: Diagnosis not present

## 2015-09-02 DIAGNOSIS — R609 Edema, unspecified: Secondary | ICD-10-CM | POA: Diagnosis not present

## 2015-09-02 DIAGNOSIS — C189 Malignant neoplasm of colon, unspecified: Secondary | ICD-10-CM | POA: Diagnosis not present

## 2015-09-02 DIAGNOSIS — I2699 Other pulmonary embolism without acute cor pulmonale: Secondary | ICD-10-CM | POA: Diagnosis not present

## 2015-09-02 DIAGNOSIS — F79 Unspecified intellectual disabilities: Secondary | ICD-10-CM | POA: Diagnosis not present

## 2015-09-02 DIAGNOSIS — I10 Essential (primary) hypertension: Secondary | ICD-10-CM

## 2015-09-02 DIAGNOSIS — K5909 Other constipation: Secondary | ICD-10-CM

## 2015-09-06 ENCOUNTER — Other Ambulatory Visit: Payer: Self-pay | Admitting: *Deleted

## 2015-09-06 DIAGNOSIS — C189 Malignant neoplasm of colon, unspecified: Secondary | ICD-10-CM

## 2015-09-07 ENCOUNTER — Ambulatory Visit: Payer: Medicare Other | Admitting: Hematology

## 2015-09-07 ENCOUNTER — Other Ambulatory Visit: Payer: Medicare Other

## 2015-09-07 ENCOUNTER — Encounter: Payer: Self-pay | Admitting: Internal Medicine

## 2015-09-07 NOTE — Progress Notes (Signed)
Patient ID: [name], female   DOB: Mar 02, 1946, 69 y.o.   MRN: [id number]    HISTORY AND PHYSICAL   DATE: 09/02/15  Location:  [facility]    Place of Service: SNF (31)   Extended Emergency Contact Information Primary Emergency Contact: Suits,Marie Address: [place], [place] Phone: [phone] Relation: Sister Secondary Emergency Contact: Suits,Vance  [place] Mobile Phone: [phone] Relation: Nephew  Advanced Directive information  FULL CODE  Chief Complaint  Patient presents with  . New Admit To SNF    HPI:  69 yo mentally challenged female seen today as a new admission into SNF following hospital stay for b/l PE, colon CA s/p previous right hemicolectomy, severe protein calorie malnutrition. She presented to the ED with dyspnea and CTA chest revealed b/l PE. She was tx with lovenox which will be long term. 2D echo showed nml EF and no right heart strain. Further imaging abdomen revealed peritoneal carcinomatosis. She is not a candidate for further tx of colon ca and palliative care/hospice recommended.   HTN/edema -  BP stable on lasix and metoprolol  Hyperlipidemia - takes statin and fenofibrate daily. No myalgias  Constipation - stable on bowel regimen.   Colon cancer - on felbamate. Not a candidate for further tx. Followed by H/O. Takes nutritional supplements and vitamin/minerals  She is nonverbal today. She is tolerating tx. No nursing issues. Appetite reduced. Sleeping well. Poor historian due to intellectual challenges. Hx obtained from chart  Past Medical History  Diagnosis Date  . Hypercholesteremia   . Constipation   . Osteoporosis   . Seizure   . Edema   . Mental retardation   . SBO (small bowel obstruction) 11/2013, 05/2015    likely due to adhesions  . Colonic mass 05/2015    right, concerning for cancer    Past Surgical History  Procedure  Laterality Date  . Cholecystectomy    . Colonoscopy with propofol N/A 06/07/2015    Procedure: COLONOSCOPY WITH PROPOFOL;  Surgeon: [name], MD;  Location: [facility];  Service: Endoscopy;  Laterality: N/A;  . Laparoscopic partial right colectomy N/A 06/08/2015    Procedure: LAPAROSCOPIC PARTIAL COLECTOMY ;WITH RESECTION OF RIGHT TERMINAL ILIUM;  Surgeon: Alphonsa Overall, MD;  Location: [facility] ORS;  Service: General;  Laterality: N/A;    Patient Care Team: Pcp Not In System as PCP - General  Social History   Social History  . Marital Status: Single    Spouse Name: N/A  . Number of Children: N/A  . Years of Education: N/A   Occupational History  . Not on file.   Social History Main Topics  . Smoking status: Never Smoker   . Smokeless tobacco: Not on file  . Alcohol Use: No  . Drug Use: No  . Sexual Activity: No   Other Topics Concern  . Not on file   Social History Narrative     reports that she has never smoked. She does not have any smokeless tobacco history on file. She reports that she does not drink alcohol or use illicit drugs.  Family History  Problem Relation Age of Onset  . Family history unknown: Yes   No family status information on file.    Immunization History  Administered Date(s) Administered  . Tdap 07/19/2011    No Known Allergies  Medications: Patient's Medications  New Prescriptions  No medications on file  Previous Medications   AMINO ACIDS-PROTEIN HYDROLYS (PRO-STAT) LIQD    Take 30 mLs by mouth 2 (two) times daily.   ATORVASTATIN (LIPITOR) 10 MG TABLET    Take 10 mg by mouth at bedtime.    BISACODYL (DULCOLAX) 5 MG EC TABLET    Take 5 mg by mouth 2 (two) times daily as needed for mild constipation or moderate constipation.    CALCIUM-VITAMIN D (OSCAL WITH D) 500-200 MG-UNIT PER TABLET    Take 1 tablet by mouth 2 (two) times daily.    CHOLECALCIFEROL (VITAMIN D) 2000 UNITS TABLET    Take 4,000 Units by mouth daily with breakfast.    DOCUSATE SODIUM (COLACE) 100 MG CAPSULE    Take 100 mg by mouth 2 (two) times daily.   ENOXAPARIN (LOVENOX) 150 MG/ML INJECTION    Inject 0.91 mLs (135 mg total) into the skin daily.   FELBAMATE (FELBATOL) 600 MG TABLET    Take 1,200 mg by mouth 2 (two) times daily.    FENOFIBRATE (TRICOR) 145 MG TABLET    Take 145 mg by mouth every morning.    FERROUS SULFATE 325 (65 FE) MG TABLET    Take 325 mg by mouth 2 (two) times daily.   FUROSEMIDE (LASIX) 20 MG TABLET    Take 20 mg by mouth every morning.    HYDROCODONE-ACETAMINOPHEN (NORCO/VICODIN) 5-325 MG PER TABLET    Take 1 tablet by mouth every 6 (six) hours as needed for moderate pain.   LACTULOSE, ENCEPHALOPATHY, (CHRONULAC) 10 GM/15ML SOLN    Take 10 g by mouth 2 (two) times daily.    METOPROLOL TARTRATE (LOPRESSOR) 25 MG TABLET    Take 1 tablet (25 mg total) by mouth 2 (two) times daily.   METRONIDAZOLE (METROGEL) 1 % GEL    Apply 1 application topically daily. Apply to reddened areas of face   POTASSIUM CHLORIDE SA (K-DUR,KLOR-CON) 20 MEQ TABLET    Take 20 mEq by mouth daily.   Modified Medications   No medications on file  Discontinued Medications   No medications on file    Review of Systems  Unable to perform ROS: Other  mentally challenged  Filed Vitals:   09/02/15 1141  BP: 130/86  Pulse: 115  Temp: 97.4 F (36.3 C)  Weight: 206 lb (93.441 kg)  SpO2: 94%   Body mass index is 32.26 kg/(m^2).  Physical Exam  Constitutional: She appears well-developed and well-nourished.  HENT:  Mouth/Throat: Oropharynx is clear and moist. No oropharyngeal exudate.  Eyes: Pupils are equal, round, and reactive to light. No scleral icterus.  Neck: Neck supple. Carotid bruit is not present. No tracheal deviation present. No thyromegaly present.  Cardiovascular: Regular rhythm, normal heart sounds and intact distal pulses.  Tachycardia present.  Exam reveals no gallop and no friction rub.   No murmur heard. +1 pitting LE edema b/l. No calf TTP   Pulmonary/Chest: Breath sounds normal. No stridor. No respiratory distress. She has no wheezes. She has no rhonchi. She has no rales.  Abdominal: Soft. Bowel sounds are normal. She exhibits distension. She exhibits no ascites and no mass. There is no hepatomegaly. There is no tenderness. There is no rebound and no guarding.  Lymphadenopathy:    She has no cervical adenopathy.  Neurological: She is alert.  Skin: Skin is warm and dry. No rash noted.  Psychiatric: She has a normal mood and affect. Her behavior is normal.     Labs reviewed: Admission on  08/23/2015, Discharged on 08/27/2015  Component Date Value Ref Range Status  . WBC 08/23/2015 9.2  4.0 - 10.5 K/uL Final  . RBC 08/23/2015 4.43  3.87 - 5.11 MIL/uL Final  . Hemoglobin 08/23/2015 12.2  12.0 - 15.0 g/dL Final  . HCT 08/23/2015 38.3  36.0 - 46.0 % Final  . MCV 08/23/2015 86.5  78.0 - 100.0 fL Final  . MCH 08/23/2015 27.5  26.0 - 34.0 pg Final  . MCHC 08/23/2015 31.9  30.0 - 36.0 g/dL Final  . RDW 08/23/2015 14.7  11.5 - 15.5 % Final  . Platelets 08/23/2015 483* 150 - 400 K/uL Final  . Neutrophils Relative % 08/23/2015 83* 43 - 77 % Final  . Neutro Abs 08/23/2015 7.6  1.7 - 7.7 K/uL Final  . Lymphocytes Relative 08/23/2015 11* 12 - 46 % Final  . Lymphs Abs 08/23/2015 1.0  0.7 - 4.0 K/uL Final  . Monocytes Relative 08/23/2015 6  3 - 12 % Final  . Monocytes Absolute 08/23/2015 0.6  0.1 - 1.0 K/uL Final  . Eosinophils Relative 08/23/2015 0  0 - 5 % Final  . Eosinophils Absolute 08/23/2015 0.0  0.0 - 0.7 K/uL Final  . Basophils Relative 08/23/2015 0  0 - 1 % Final  . Basophils Absolute 08/23/2015 0.0  0.0 - 0.1 K/uL Final  . Lactic Acid, Venous 08/23/2015 1.67  0.5 - 2.0 mmol/L Final  . Sodium 08/23/2015 135  135 - 145 mmol/L Final  . Potassium 08/23/2015 4.6  3.5 - 5.1 mmol/L Final  . Chloride 08/23/2015 99* 101 - 111 mmol/L Final  . CO2 08/23/2015 27  22 - 32 mmol/L Final  . Glucose, Bld 08/23/2015 115* 65 - 99 mg/dL  Final  . BUN 08/23/2015 11  6 - 20 mg/dL Final  . Creatinine, Ser 08/23/2015 0.72  0.44 - 1.00 mg/dL Final  . Calcium 08/23/2015 8.9  8.9 - 10.3 mg/dL Final  . Total Protein 08/23/2015 6.8  6.5 - 8.1 g/dL Final  . Albumin 08/23/2015 2.6* 3.5 - 5.0 g/dL Final  . AST 08/23/2015 20  15 - 41 U/L Final  . ALT 08/23/2015 10* 14 - 54 U/L Final  . Alkaline Phosphatase 08/23/2015 76  38 - 126 U/L Final  . Total Bilirubin 08/23/2015 0.2* 0.3 - 1.2 mg/dL Final  . GFR calc non Af Amer 08/23/2015 >60  >60 mL/min Final  . GFR calc Af Amer 08/23/2015 >60  >60 mL/min Final   Comment: (NOTE) The eGFR has been calculated using the CKD EPI equation. This calculation has not been validated in all clinical situations. eGFR's persistently <60 mL/min signify possible Chronic Kidney Disease.   . Anion gap 08/23/2015 9  5 - 15 Final  . Troponin i, poc 08/23/2015 0.00  0.00 - 0.08 ng/mL Final  . Comment 3 08/23/2015          Final   Comment: Due to the release kinetics of cTnI, a negative result within the first hours of the onset of symptoms does not rule out myocardial infarction with certainty. If myocardial infarction is still suspected, repeat the test at appropriate intervals.   Marland Kitchen aPTT 08/23/2015 34  24 - 37 seconds Final  . Prothrombin Time 08/23/2015 15.1  11.6 - 15.2 seconds Final  . INR 08/23/2015 1.17  0.00 - 1.49 Final  . Troponin I 08/23/2015 <0.03  <0.031 ng/mL Final   Comment:        NO INDICATION OF MYOCARDIAL INJURY.   . Troponin I  08/23/2015 <0.03  <0.031 ng/mL Final   Comment:        NO INDICATION OF MYOCARDIAL INJURY.   . Troponin I 08/24/2015 <0.03  <0.031 ng/mL Final   Comment:        NO INDICATION OF MYOCARDIAL INJURY.   . MRSA by PCR 08/23/2015 NEGATIVE  NEGATIVE Final   Comment:        The GeneXpert MRSA Assay (FDA approved for NASAL specimens only), is one component of a comprehensive MRSA colonization surveillance program. It is not intended to diagnose  MRSA infection nor to guide or monitor treatment for MRSA infections.   . Sodium 08/24/2015 135  135 - 145 mmol/L Final  . Potassium 08/24/2015 3.9  3.5 - 5.1 mmol/L Final  . Chloride 08/24/2015 102  101 - 111 mmol/L Final  . CO2 08/24/2015 26  22 - 32 mmol/L Final  . Glucose, Bld 08/24/2015 115* 65 - 99 mg/dL Final  . BUN 08/24/2015 10  6 - 20 mg/dL Final  . Creatinine, Ser 08/24/2015 0.54  0.44 - 1.00 mg/dL Final  . Calcium 08/24/2015 8.3* 8.9 - 10.3 mg/dL Final  . Total Protein 08/24/2015 5.5* 6.5 - 8.1 g/dL Final  . Albumin 08/24/2015 2.1* 3.5 - 5.0 g/dL Final  . AST 08/24/2015 17  15 - 41 U/L Final  . ALT 08/24/2015 9* 14 - 54 U/L Final  . Alkaline Phosphatase 08/24/2015 64  38 - 126 U/L Final  . Total Bilirubin 08/24/2015 0.4  0.3 - 1.2 mg/dL Final  . GFR calc non Af Amer 08/24/2015 >60  >60 mL/min Final  . GFR calc Af Amer 08/24/2015 >60  >60 mL/min Final   Comment: (NOTE) The eGFR has been calculated using the CKD EPI equation. This calculation has not been validated in all clinical situations. eGFR's persistently <60 mL/min signify possible Chronic Kidney Disease.   . Anion gap 08/24/2015 7  5 - 15 Final  . WBC 08/24/2015 7.6  4.0 - 10.5 K/uL Final  . RBC 08/24/2015 3.88  3.87 - 5.11 MIL/uL Final  . Hemoglobin 08/24/2015 10.4* 12.0 - 15.0 g/dL Final  . HCT 08/24/2015 33.3* 36.0 - 46.0 % Final  . MCV 08/24/2015 85.8  78.0 - 100.0 fL Final  . MCH 08/24/2015 26.8  26.0 - 34.0 pg Final  . MCHC 08/24/2015 31.2  30.0 - 36.0 g/dL Final  . RDW 08/24/2015 14.8  11.5 - 15.5 % Final  . Platelets 08/24/2015 434* 150 - 400 K/uL Final  . Heparin Unfractionated 08/23/2015 0.31  0.30 - 0.70 IU/mL Final   Comment:        IF HEPARIN RESULTS ARE BELOW EXPECTED VALUES, AND PATIENT DOSAGE HAS BEEN CONFIRMED, SUGGEST FOLLOW UP TESTING OF ANTITHROMBIN III LEVELS.   Marland Kitchen Heparin Unfractionated 08/24/2015 0.36  0.30 - 0.70 IU/mL Final   Comment:        IF HEPARIN RESULTS ARE  BELOW EXPECTED VALUES, AND PATIENT DOSAGE HAS BEEN CONFIRMED, SUGGEST FOLLOW UP TESTING OF ANTITHROMBIN III LEVELS.   . WBC 08/25/2015 8.0  4.0 - 10.5 K/uL Final  . RBC 08/25/2015 3.98  3.87 - 5.11 MIL/uL Final  . Hemoglobin 08/25/2015 11.1* 12.0 - 15.0 g/dL Final  . HCT 08/25/2015 34.5* 36.0 - 46.0 % Final  . MCV 08/25/2015 86.7  78.0 - 100.0 fL Final  . MCH 08/25/2015 27.9  26.0 - 34.0 pg Final  . MCHC 08/25/2015 32.2  30.0 - 36.0 g/dL Final  . RDW 08/25/2015 14.9  11.5 - 15.5 %  Final  . Platelets 08/25/2015 411* 150 - 400 K/uL Final  . Creatinine, Ser 08/25/2015 0.62  0.44 - 1.00 mg/dL Final  . GFR calc non Af Amer 08/25/2015 >60  >60 mL/min Final  . GFR calc Af Amer 08/25/2015 >60  >60 mL/min Final   Comment: (NOTE) The eGFR has been calculated using the CKD EPI equation. This calculation has not been validated in all clinical situations. eGFR's persistently <60 mL/min signify possible Chronic Kidney Disease.   Admission on 08/12/2015, Discharged on 08/12/2015  Component Date Value Ref Range Status  . WBC 08/12/2015 8.7  4.0 - 10.5 K/uL Final  . RBC 08/12/2015 4.17  3.87 - 5.11 MIL/uL Final  . Hemoglobin 08/12/2015 11.6* 12.0 - 15.0 g/dL Final  . HCT 08/12/2015 36.8  36.0 - 46.0 % Final  . MCV 08/12/2015 88.2  78.0 - 100.0 fL Final  . MCH 08/12/2015 27.8  26.0 - 34.0 pg Final  . MCHC 08/12/2015 31.5  30.0 - 36.0 g/dL Final  . RDW 08/12/2015 14.6  11.5 - 15.5 % Final  . Platelets 08/12/2015 473* 150 - 400 K/uL Final  . Neutrophils Relative % 08/12/2015 80* 43 - 77 % Final  . Neutro Abs 08/12/2015 6.8  1.7 - 7.7 K/uL Final  . Lymphocytes Relative 08/12/2015 13  12 - 46 % Final  . Lymphs Abs 08/12/2015 1.2  0.7 - 4.0 K/uL Final  . Monocytes Relative 08/12/2015 7  3 - 12 % Final  . Monocytes Absolute 08/12/2015 0.6  0.1 - 1.0 K/uL Final  . Eosinophils Relative 08/12/2015 0  0 - 5 % Final  . Eosinophils Absolute 08/12/2015 0.0  0.0 - 0.7 K/uL Final  . Basophils Relative  08/12/2015 0  0 - 1 % Final  . Basophils Absolute 08/12/2015 0.0  0.0 - 0.1 K/uL Final  . Sodium 08/12/2015 138  135 - 145 mmol/L Final  . Potassium 08/12/2015 4.1  3.5 - 5.1 mmol/L Final  . Chloride 08/12/2015 101  101 - 111 mmol/L Final  . CO2 08/12/2015 27  22 - 32 mmol/L Final  . Glucose, Bld 08/12/2015 109* 65 - 99 mg/dL Final  . BUN 08/12/2015 13  6 - 20 mg/dL Final  . Creatinine, Ser 08/12/2015 0.71  0.44 - 1.00 mg/dL Final  . Calcium 08/12/2015 9.0  8.9 - 10.3 mg/dL Final  . Total Protein 08/12/2015 7.1  6.5 - 8.1 g/dL Final  . Albumin 08/12/2015 2.8* 3.5 - 5.0 g/dL Final  . AST 08/12/2015 17  15 - 41 U/L Final  . ALT 08/12/2015 10* 14 - 54 U/L Final  . Alkaline Phosphatase 08/12/2015 68  38 - 126 U/L Final  . Total Bilirubin 08/12/2015 0.4  0.3 - 1.2 mg/dL Final  . GFR calc non Af Amer 08/12/2015 >60  >60 mL/min Final  . GFR calc Af Amer 08/12/2015 >60  >60 mL/min Final   Comment: (NOTE) The eGFR has been calculated using the CKD EPI equation. This calculation has not been validated in all clinical situations. eGFR's persistently <60 mL/min signify possible Chronic Kidney Disease.   . Anion gap 08/12/2015 10  5 - 15 Final  . Lipase 08/12/2015 15* 22 - 51 U/L Final  . Color, Urine 08/12/2015 YELLOW  YELLOW Final  . APPearance 08/12/2015 CLOUDY* CLEAR Final  . Specific Gravity, Urine 08/12/2015 1.025  1.005 - 1.030 Final  . pH 08/12/2015 5.5  5.0 - 8.0 Final  . Glucose, UA 08/12/2015 NEGATIVE  NEGATIVE mg/dL Final  .  Hgb urine dipstick 08/12/2015 NEGATIVE  NEGATIVE Final  . Bilirubin Urine 08/12/2015 NEGATIVE  NEGATIVE Final  . Ketones, ur 08/12/2015 NEGATIVE  NEGATIVE mg/dL Final  . Protein, ur 08/12/2015 NEGATIVE  NEGATIVE mg/dL Final  . Urobilinogen, UA 08/12/2015 0.2  0.0 - 1.0 mg/dL Final  . Nitrite 08/12/2015 NEGATIVE  NEGATIVE Final  . Leukocytes, UA 08/12/2015 NEGATIVE  NEGATIVE Final   MICROSCOPIC NOT DONE ON URINES WITH NEGATIVE PROTEIN, BLOOD, LEUKOCYTES,  NITRITE, OR GLUCOSE <1000 mg/dL.  Admission on 05/30/2015, Discharged on 06/14/2015  No results displayed because visit has over 200 results.      Dg Chest 2 View  08/23/2015   CLINICAL DATA:  Shortness of breath.  EXAM: CHEST  2 VIEW  COMPARISON:  June 08, 2015  FINDINGS: The heart size and mediastinal contours are within normal limits. The lung volumes are low. The aorta is tortuous. There is no focal infiltrate, pulmonary edema, or pleural effusion. The visualized skeletal structures are stable.  IMPRESSION: No active cardiopulmonary disease.   Electronically Signed   By: Abelardo Diesel M.D.   On: 08/23/2015 13:04   Ct Angio Chest Pe W/cm &/or Wo Cm  08/23/2015   CLINICAL DATA:  Tachycardia and shortness of breath. History of colon cancer and peritoneal carcinomatosis.  EXAM: CT ANGIOGRAPHY CHEST WITH CONTRAST  TECHNIQUE: Multidetector CT imaging of the chest was performed using the standard protocol during bolus administration of intravenous contrast. Multiplanar CT image reconstructions and MIPs were obtained to evaluate the vascular anatomy.  CONTRAST:  125mL OMNIPAQUE IOHEXOL 350 MG/ML SOLN  COMPARISON:  Chest radiograph 08/23/2015 and abdominal CT 08/12/2015  FINDINGS: Positive for bilateral pulmonary emboli. There is clot in the distal main right pulmonary artery which extends into the right lower lobe and right upper lobe pulmonary arteries. Segmental clot in the right upper lobe. There is segmental and subsegmental clot in the right lower lobe. Small amount of clot in the left pulmonary arteries. There is a thrombosed segmental branch in the posterior left upper lobe. There is thrombus in the left lower lobe pulmonary artery and additional thrombus in a left lower lobe subsegmental branches. The RV/LV ratio is approximately 0.9.  There is no significant chest lymphadenopathy. No significant pericardial or pleural fluid. There is ascites throughout the upper abdomen. Limited evaluation of the  abdominal structures.  Trachea and mainstem bronchi are patent. No significant airspace disease or consolidation in the lungs. Mild volume loss at the lung bases.  No acute bone abnormality.  Review of the MIP images confirms the above findings.  IMPRESSION: Positive for bilateral pulmonary emboli, right side greater than left. CT raises concern for right heart strain (RV/LV Ratio = 0.9) consistent with at least submassive (intermediate risk) PE.  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  Critical Value/emergent results were called by telephone at the time of interpretation on 08/23/2015 at 2:37 pm to Dr. Leo Grosser , who verbally acknowledged these results.   Electronically Signed   By: Markus Daft M.D.   On: 08/23/2015 14:37   US Abdomen Complete  08/26/2015   CLINICAL DATA:  Right upper quadrant pain for 2 days. History of a cholecystectomy. History of a colonic mass.  EXAM: ULTRASOUND ABDOMEN COMPLETE  COMPARISON:  CT, 08/12/2015.  FINDINGS: Gallbladder: Surgically absent  Common bile duct: Diameter: 10 mm, similar to its size on the prior CT. No convincing duct stone.  Liver: Coarsened echotexture. No discrete mass or focal lesion. Hepatopetal flow documented in the portal  vein.  IVC: No abnormality visualized.  Pancreas: Not well visualized.  Spleen: Size and appearance within normal limits.  Right Kidney: Length: 10.2 cm. Diffuse cortical thinning. Normal parenchymal echogenicity. No mass or stone. No hydronephrosis.  Left Kidney: Length: 11.5 cm. Diffuse cortical thinning. Normal parenchymal echogenicity. No mass or stone. No hydronephrosis.  Abdominal aorta: No aneurysm visualized.  Other findings: Small to moderate amount of ascites surrounds the liver and spleen.  IMPRESSION: 1. No acute finding. 2. Common bile duct is dilated to 1 cm, but this is chronic unchanged from prior imaging. 3. Coarsened echotexture of the liver. This may reflect cirrhosis. No liver mass or focal  lesion. 4. Bilateral renal cortical thinning. 5. Ascites similar to that seen on the recent prior CT.   Electronically Signed   By: Lajean Manes M.D.   On: 08/26/2015 17:54   Ct Renal Stone Study  08/12/2015   CLINICAL DATA:  Right upper quadrant pain for 2 months. History of colon cancer. Status post right hemicolectomy and resection of the terminal ileum 06/08/2015. Subsequent encounter.  EXAM: CT ABDOMEN AND PELVIS WITHOUT CONTRAST  TECHNIQUE: Multidetector CT imaging of the abdomen and pelvis was performed following the standard protocol without IV contrast.  COMPARISON:  CT abdomen and pelvis 05/30/2015.  FINDINGS: Trace right pleural effusion is identified. Mild dependent atelectasis is seen. No pericardial effusion.  The patient has a moderate volume of abdominal and pelvic ascites. Omental tumor implants are identified with the largest deposits seen in the right lower quadrant. No focal liver lesion is seen on this uninfused examination. The spleen, adrenal glands, kidneys and pancreas unremarkable.  The patient is status post resection of the distal ileum and a portion of the ascending colon. There is no small bowel obstruction. The colon is otherwise unremarkable. The stomach is nearly completely decompressed but otherwise unremarkable. Aortoiliac atherosclerosis without aneurysm is identified. A mesenteric lymph node in the left upper quadrant measuring 1.2 cm is seen on image 31.  No lytic or sclerotic bony lesion is identified. Lumbar spondylosis appears most notable at L1-2 and L4-5.  IMPRESSION: Peritoneal carcinomatosis with omental caking appearing worst in the right lower quadrant and an associated moderate volume of abdominal and pelvic ascites.  Status post resection of the distal ileum and ascending colon without evidence complication.   Electronically Signed   By: Inge Rise M.D.   On: 08/12/2015 15:11     Assessment/Plan   ICD-9-CM ICD-10-CM   1. Pulmonary emboli b/l 415.19  I26.99   2. Colon cancer  153.9 C18.9    with peritoneal carcinomatosis - s/p right hemicolectomy earlier this summer; not a candidate for further tx  3. Essential hypertension - stable 401.9 I10   4. Protein-calorie malnutrition, severe - due to #2 262 E43   5. Edema - stable 782.3 R60.9   6. Chronic constipation - stable; due to #2 564.00 K59.00   7. Mentally challenged 28 F79     --cont nutritional supplement BID   --cont current meds as ordered  --PT/OT as ordered  --GOAL: short term rehab and d/c back to group home when medically appropriate. Communicated with pt and nursing.  --prognosis is poor due to colon ca with mets. Palliative care consult pending. will follow  Dewan Emond S. Perlie Gold  [facility] [address] [place], [place] [address] [phone] Cell (Monday-Friday 8 AM - 5 PM) [phone] After 5 PM and follow prompts

## 2015-09-16 ENCOUNTER — Non-Acute Institutional Stay (SKILLED_NURSING_FACILITY): Payer: Medicare Other | Admitting: Internal Medicine

## 2015-09-16 DIAGNOSIS — E43 Unspecified severe protein-calorie malnutrition: Secondary | ICD-10-CM

## 2015-09-16 DIAGNOSIS — C189 Malignant neoplasm of colon, unspecified: Secondary | ICD-10-CM

## 2015-09-16 DIAGNOSIS — L03311 Cellulitis of abdominal wall: Secondary | ICD-10-CM

## 2015-09-22 ENCOUNTER — Telehealth: Payer: Self-pay | Admitting: *Deleted

## 2015-09-22 NOTE — Telephone Encounter (Signed)
[name] with Hospice at [facility] called to say that patient is currently at [facility] center, however her family is planning to take her home for end of life care.  They would like a hospice referral..   Dr. Burr Medico is fine with this and she is willing to be the attending and have the Hospice MD to symptom management.

## 2015-09-23 ENCOUNTER — Encounter: Payer: Self-pay | Admitting: Adult Health

## 2015-09-23 ENCOUNTER — Non-Acute Institutional Stay (SKILLED_NURSING_FACILITY): Payer: Medicare Other | Admitting: Adult Health

## 2015-09-23 DIAGNOSIS — E43 Unspecified severe protein-calorie malnutrition: Secondary | ICD-10-CM

## 2015-09-23 DIAGNOSIS — R569 Unspecified convulsions: Secondary | ICD-10-CM

## 2015-09-23 DIAGNOSIS — C189 Malignant neoplasm of colon, unspecified: Secondary | ICD-10-CM

## 2015-09-23 DIAGNOSIS — I2699 Other pulmonary embolism without acute cor pulmonale: Secondary | ICD-10-CM | POA: Diagnosis not present

## 2015-09-23 NOTE — Progress Notes (Signed)
Patient ID: [name], female   DOB: 10/22/1946, 69 y.o.   MRN: [id number]   Facility: [facility]      No Known Allergies  Chief Complaint  Patient presents with  . Discharge Note    HPI:  She is being discharged to home with hospice care. She will not need dme; will not need home health. Her medical care will be done with hospice care. She will need her prescriptions written.  She has a history of colon cancer for which she is not a candidate for radiation or chemo. She had been hospitalized for bilateral PE.    Past Medical History  Diagnosis Date  . Hypercholesteremia   . Constipation   . Osteoporosis   . Seizure   . Edema   . Mental retardation   . SBO (small bowel obstruction) 11/2013, 05/2015    likely due to adhesions  . Colonic mass 05/2015    right, concerning for cancer    Past Surgical History  Procedure Laterality Date  . Cholecystectomy    . Colonoscopy with propofol N/A 06/07/2015    Procedure: COLONOSCOPY WITH PROPOFOL;  Surgeon: [name], MD;  Location: [facility];  Service: Endoscopy;  Laterality: N/A;  . Laparoscopic partial right colectomy N/A 06/08/2015    Procedure: LAPAROSCOPIC PARTIAL COLECTOMY ;WITH RESECTION OF RIGHT TERMINAL ILIUM;  Surgeon: Alphonsa Overall, MD;  Location: [facility];  Service: General;  Laterality: N/A;    VITAL SIGNS BP 143/66 mmHg  Pulse 58  Ht 5\' 7"  (1.702 m)  Wt 206 lb (93.441 kg)  BMI 32.26 kg/m2  SpO2 96%  Patient's Medications  New Prescriptions   No medications on file  Previous Medications   AMINO ACIDS-PROTEIN HYDROLYS (PRO-STAT) LIQD    Take 30 mLs by mouth 2 (two) times daily.   ATORVASTATIN (LIPITOR) 10 MG TABLET    Take 10 mg by mouth at bedtime.    BISACODYL (DULCOLAX) 5 MG EC TABLET    Take 5 mg by mouth 2 (two) times daily as needed for mild constipation or moderate constipation.    CALCIUM-VITAMIN D (OSCAL WITH D) 500-200 MG-UNIT PER TABLET    Take 1 tablet by mouth 2 (two) times  daily.    CHOLECALCIFEROL (VITAMIN D) 2000 UNITS TABLET    Take 4,000 Units by mouth daily with breakfast.   DOCUSATE SODIUM (COLACE) 100 MG CAPSULE    Take 100 mg by mouth 2 (two) times daily.   ENOXAPARIN (LOVENOX) 150 MG/ML INJECTION    Inject 0.91 mLs (135 mg total) into the skin daily.   FELBAMATE (FELBATOL) 600 MG TABLET    Take 1,200 mg by mouth 2 (two) times daily.    FENOFIBRATE (TRICOR) 145 MG TABLET    Take 145 mg by mouth every morning.    FERROUS SULFATE 325 (65 FE) MG TABLET    Take 325 mg by mouth 2 (two) times daily.   FUROSEMIDE (LASIX) 20 MG TABLET    Take 20 mg by mouth every morning.    HYDROCODONE-ACETAMINOPHEN (NORCO/VICODIN) 5-325 MG PER TABLET    Take 1 tablet by mouth every 6 (six) hours as needed for moderate pain.   LACTULOSE, ENCEPHALOPATHY, (CHRONULAC) 10 GM/15ML SOLN    Take 10 g by mouth 2 (two) times daily.    METOPROLOL TARTRATE (LOPRESSOR) 25 MG TABLET    Take 1 tablet (25 mg total) by mouth 2 (two) times daily.   METRONIDAZOLE (METROGEL) 1 % GEL    Apply  1 application topically daily. Apply to reddened areas of face   POTASSIUM CHLORIDE SA (K-DUR,KLOR-CON) 20 MEQ TABLET    Take 20 mEq by mouth daily.   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 Status post resection of the distal ileum and ascending colon without evidence complication.  08-23-15: chets x-ray: No active cardiopulmonary disease.  08-23-15: ct angio of chest: Positive for bilateral pulmonary emboli, right side greater than left. CT raises concern for right heart strain (RV/LV Ratio = 0.9) consistent with at least submassive (intermediate risk) PE. Abdominal ascites compatible with known peritoneal  carcinomatosis.  08-24-15: TEE: Left ventricle: The cavity size was normal. Systolic function was normal. The estimated ejection fraction was in the range of 60% to 65%. Wall motion was normal; there were no regional wall motion abnormalities. Due to tachycardia, there was fusion of early and atrial contributions to ventricular filling.   LABS REVIEWED:   06-11-15: vit b12: 942; folate 15.5; iron 18; tibc 274 08-23-15: wbc 9.2; hgb 12.2; hct 38.3; mcv 86.5; plt 483 glucose 115; bun 11; creat 0.72; k+ 4.6; na++135; liver normal; albumin 2.6 08-24-15: wbc 7.6; hgb 10.4; hct 33.3; mcv 85.8; plt 434; glucose 115; bun 10; creat 0.54; k+3.9; na++135; liver normal albumin 2.1 08-25-15: wbc 8.0; hgb 11.1; hct 34.5; mcv 86.7; plt 411      Review of Systems Unable to perform ROS: Other      Physical Exam Constitutional: No distress.  Obese   Eyes: Conjunctivae are normal.  Neck: Neck supple. No JVD present. No thyromegaly present.  Cardiovascular: Normal rate, regular rhythm and intact distal pulses.   Respiratory: Effort normal and breath sounds normal. No respiratory distress. She has no wheezes.  GI: Soft. Bowel sounds are normal. She exhibits no distension. There is no tenderness.  Musculoskeletal: She exhibits edema.  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  Neurological: She is alert.  Skin: Skin is warm and dry. She is not diaphoretic.  Psychiatric: She has a normal mood and affect.      ASSESSMENT/ PLAN:  Will discharge her to home with hospice care. She will not need dme or home health. Her prescriptions have been written for a 30 day supply #30 vicodin 5/35 mg tabs. She will follow up medically with hospice care.   Time spent with patient  40  minutes >50% time spent counseling; reviewing medical record; tests; labs; and developing future plan of care   Ok Edwards  NP [facility]  Contact [phone] Monday through Friday 8am- 5pm  After hours call [phone]

## 2015-09-24 ENCOUNTER — Telehealth: Payer: Self-pay | Admitting: *Deleted

## 2015-09-24 NOTE — Telephone Encounter (Signed)
Received call from [facility] stating pt admitted to Hospice.  She has been at [facility] received lovenox 135mg  daily in her abd & her lower abd is fiery red & some area of reddness in [place]  She is wondering if pt is allergic to lovenox or could this be some cellulitis.  She is on augmentin.  Discussed with Dr. Burr Medico & OK to D/C lovenox if family is in agreement.  She had Bil PE @ 1 mo ago & although not ideal, PE is probably better.  Let family know that PE could recur off lovenox.  Helene Kelp was agreeable with plan & will discuss with family.

## 2015-09-26 ENCOUNTER — Emergency Department (HOSPITAL_COMMUNITY)

## 2015-09-26 ENCOUNTER — Inpatient Hospital Stay (HOSPITAL_COMMUNITY)
Admission: EM | Admit: 2015-09-26 | Discharge: 2015-09-28 | DRG: 375 | Disposition: A | Discharge status: Hospice | Attending: Internal Medicine | Admitting: Internal Medicine

## 2015-09-26 ENCOUNTER — Encounter (HOSPITAL_COMMUNITY): Payer: Self-pay

## 2015-09-26 DIAGNOSIS — I10 Essential (primary) hypertension: Secondary | ICD-10-CM | POA: Diagnosis present

## 2015-09-26 DIAGNOSIS — R569 Unspecified convulsions: Secondary | ICD-10-CM | POA: Diagnosis present

## 2015-09-26 DIAGNOSIS — D696 Thrombocytopenia, unspecified: Secondary | ICD-10-CM | POA: Diagnosis present

## 2015-09-26 DIAGNOSIS — R188 Other ascites: Secondary | ICD-10-CM | POA: Diagnosis present

## 2015-09-26 DIAGNOSIS — K5669 Other intestinal obstruction: Secondary | ICD-10-CM

## 2015-09-26 DIAGNOSIS — K56609 Unspecified intestinal obstruction, unspecified as to partial versus complete obstruction: Secondary | ICD-10-CM | POA: Diagnosis present

## 2015-09-26 DIAGNOSIS — Z86711 Personal history of pulmonary embolism: Secondary | ICD-10-CM | POA: Diagnosis not present

## 2015-09-26 DIAGNOSIS — C189 Malignant neoplasm of colon, unspecified: Secondary | ICD-10-CM | POA: Diagnosis not present

## 2015-09-26 DIAGNOSIS — M810 Age-related osteoporosis without current pathological fracture: Secondary | ICD-10-CM | POA: Diagnosis present

## 2015-09-26 DIAGNOSIS — R627 Adult failure to thrive: Secondary | ICD-10-CM | POA: Diagnosis present

## 2015-09-26 DIAGNOSIS — D72829 Elevated white blood cell count, unspecified: Secondary | ICD-10-CM | POA: Diagnosis not present

## 2015-09-26 DIAGNOSIS — F79 Unspecified intellectual disabilities: Secondary | ICD-10-CM | POA: Diagnosis present

## 2015-09-26 DIAGNOSIS — Z66 Do not resuscitate: Secondary | ICD-10-CM | POA: Diagnosis present

## 2015-09-26 DIAGNOSIS — Z79899 Other long term (current) drug therapy: Secondary | ICD-10-CM

## 2015-09-26 DIAGNOSIS — K5660 Unspecified intestinal obstruction: Secondary | ICD-10-CM | POA: Diagnosis present

## 2015-09-26 DIAGNOSIS — Z85038 Personal history of other malignant neoplasm of large intestine: Secondary | ICD-10-CM

## 2015-09-26 DIAGNOSIS — C786 Secondary malignant neoplasm of retroperitoneum and peritoneum: Principal | ICD-10-CM | POA: Diagnosis present

## 2015-09-26 DIAGNOSIS — Z79891 Long term (current) use of opiate analgesic: Secondary | ICD-10-CM | POA: Diagnosis not present

## 2015-09-26 DIAGNOSIS — E7800 Pure hypercholesterolemia, unspecified: Secondary | ICD-10-CM | POA: Diagnosis present

## 2015-09-26 DIAGNOSIS — J90 Pleural effusion, not elsewhere classified: Secondary | ICD-10-CM | POA: Diagnosis present

## 2015-09-26 DIAGNOSIS — Z4659 Encounter for fitting and adjustment of other gastrointestinal appliance and device: Secondary | ICD-10-CM

## 2015-09-26 DIAGNOSIS — C801 Malignant (primary) neoplasm, unspecified: Secondary | ICD-10-CM

## 2015-09-26 DIAGNOSIS — R140 Abdominal distension (gaseous): Secondary | ICD-10-CM | POA: Diagnosis present

## 2015-09-26 DIAGNOSIS — K567 Ileus, unspecified: Secondary | ICD-10-CM | POA: Diagnosis present

## 2015-09-26 DIAGNOSIS — Z515 Encounter for palliative care: Secondary | ICD-10-CM | POA: Diagnosis not present

## 2015-09-26 DIAGNOSIS — Z809 Family history of malignant neoplasm, unspecified: Secondary | ICD-10-CM | POA: Diagnosis not present

## 2015-09-26 DIAGNOSIS — K560 Paralytic ileus: Secondary | ICD-10-CM | POA: Diagnosis present

## 2015-09-26 LAB — LACTIC ACID, PLASMA
Lactic Acid, Venous: 1 mmol/L (ref 0.5–2.0)
Lactic Acid, Venous: 1 mmol/L (ref 0.5–2.0)

## 2015-09-26 LAB — CBC WITH DIFFERENTIAL/PLATELET
Basophils Absolute: 0 10*3/uL (ref 0.0–0.1)
Basophils Relative: 0 %
Eosinophils Absolute: 0 10*3/uL (ref 0.0–0.7)
Eosinophils Relative: 0 %
HCT: 33.2 % — ABNORMAL LOW (ref 36.0–46.0)
Hemoglobin: 10.5 g/dL — ABNORMAL LOW (ref 12.0–15.0)
Lymphocytes Relative: 3 %
Lymphs Abs: 0.4 10*3/uL — ABNORMAL LOW (ref 0.7–4.0)
MCH: 26.6 pg (ref 26.0–34.0)
MCHC: 31.6 g/dL (ref 30.0–36.0)
MCV: 84.1 fL (ref 78.0–100.0)
Monocytes Absolute: 0.4 10*3/uL (ref 0.1–1.0)
Monocytes Relative: 3 %
Neutro Abs: 14.3 10*3/uL — ABNORMAL HIGH (ref 1.7–7.7)
Neutrophils Relative %: 94 %
Platelets: 534 10*3/uL — ABNORMAL HIGH (ref 150–400)
RBC: 3.95 MIL/uL (ref 3.87–5.11)
RDW: 15.3 % (ref 11.5–15.5)
WBC: 15.1 10*3/uL — ABNORMAL HIGH (ref 4.0–10.5)

## 2015-09-26 LAB — COMPREHENSIVE METABOLIC PANEL
ALT: 10 U/L — ABNORMAL LOW (ref 14–54)
AST: 17 U/L (ref 15–41)
Albumin: 2.1 g/dL — ABNORMAL LOW (ref 3.5–5.0)
Alkaline Phosphatase: 100 U/L (ref 38–126)
Anion gap: 9 (ref 5–15)
BUN: 17 mg/dL (ref 6–20)
CO2: 21 mmol/L — ABNORMAL LOW (ref 22–32)
Calcium: 8.7 mg/dL — ABNORMAL LOW (ref 8.9–10.3)
Chloride: 105 mmol/L (ref 101–111)
Creatinine, Ser: 0.62 mg/dL (ref 0.44–1.00)
GFR calc Af Amer: >60 mL/min (ref >60)
GFR calc non Af Amer: >60 mL/min (ref >60)
Glucose, Bld: 135 mg/dL — ABNORMAL HIGH (ref 65–99)
Potassium: 4.8 mmol/L (ref 3.5–5.1)
Sodium: 135 mmol/L (ref 135–145)
Total Bilirubin: 0.2 mg/dL — ABNORMAL LOW (ref 0.3–1.2)
Total Protein: 6.8 g/dL (ref 6.5–8.1)

## 2015-09-26 LAB — LIPASE, BLOOD: Lipase: 18 U/L — ABNORMAL LOW (ref 22–51)

## 2015-09-26 MED ORDER — ONDANSETRON HCL 4 MG/2ML IJ SOLN
4.0000 mg | Freq: Once | INTRAMUSCULAR | Status: AC
  Administered 2015-09-26: 4 mg via INTRAVENOUS
  Filled 2015-09-26: qty 2

## 2015-09-26 MED ORDER — PANTOPRAZOLE SODIUM 40 MG IV SOLR
40.0000 mg | Freq: Two times a day (BID) | INTRAVENOUS | Status: DC
  Administered 2015-09-27 (×2): 40 mg via INTRAVENOUS
  Filled 2015-09-26 (×3): qty 40

## 2015-09-26 MED ORDER — LORAZEPAM 2 MG/ML IJ SOLN
1.0000 mg | Freq: Once | INTRAMUSCULAR | Status: AC
  Administered 2015-09-26: 1 mg via INTRAVENOUS
  Filled 2015-09-26: qty 1

## 2015-09-26 MED ORDER — FENTANYL CITRATE (PF) 100 MCG/2ML IJ SOLN
100.0000 ug | Freq: Once | INTRAMUSCULAR | Status: AC
  Administered 2015-09-26: 100 ug via INTRAVENOUS
  Filled 2015-09-26: qty 2

## 2015-09-26 MED ORDER — SODIUM CHLORIDE 0.9 % IV SOLN
INTRAVENOUS | Status: DC
  Administered 2015-09-26 – 2015-09-27 (×2): via INTRAVENOUS

## 2015-09-26 MED ORDER — IOHEXOL 300 MG/ML  SOLN
100.0000 mL | Freq: Once | INTRAMUSCULAR | Status: AC | PRN
  Administered 2015-09-26: 100 mL via INTRAVENOUS

## 2015-09-26 MED ORDER — FENTANYL CITRATE (PF) 100 MCG/2ML IJ SOLN
100.0000 ug | Freq: Once | INTRAMUSCULAR | Status: AC
  Administered 2015-09-27: 100 ug via INTRAVENOUS
  Filled 2015-09-26: qty 2

## 2015-09-26 NOTE — H&P (Addendum)
[facility] Admission History and Physical       Margaret PUZZO LTR:[id number] DOB: [birth year] DOA: 09/26/2015  Referring physician: EDP PCP: Pcp Not In System  Specialists:   Chief Complaint: ABD Distension and Pain with Nausea and Vomiting  HPI: [name] is a 69 y.o. female with a history of Colon Cancer Dx 05/2015 S/P Resection with recurrence of Tumor and no further treatments who is currently on Hospice Care who presents to the ED with increased ABD Distension and Pain and Increased Nausea and Vomiting x 2 days.   Her Sister is at [facility] and gives the history.   She has not had hematemesis or fever or chills.  Her sister reports that she was discharged from a Group home to the sisters's home 4 days ago.   An Acute ABD Series was performed and revealed an Ileus versus SBO, and a CGT scan of the ABD /Pelvis was then performed and revealed diffuse Neoplastic Disease, Peritoneal Carcinomatosis, Ascites,  and a Small Left pleural effusion.  An NGT was placed in the ED to LIMS.           Review of Systems: Unable to Obtain from the Patient  Past Medical History  Diagnosis Date  . Hypercholesteremia   . Constipation   . Osteoporosis   . Seizure ([place])   . Edema   . Mental retardation   . SBO (small bowel obstruction) ([place]) 11/2013, 05/2015    likely due to adhesions  . Colonic mass 05/2015    right, concerning for cancer     Past Surgical History  Procedure Laterality Date  . Cholecystectomy    . Colonoscopy with propofol N/A 06/07/2015    Procedure: COLONOSCOPY WITH PROPOFOL;  Surgeon: [name], MD;  Location: [facility];  Service: Endoscopy;  Laterality: N/A;  . Laparoscopic partial right colectomy N/A 06/08/2015    Procedure: LAPAROSCOPIC PARTIAL COLECTOMY ;WITH RESECTION OF RIGHT TERMINAL ILIUM;  Surgeon: Alphonsa Overall, MD;  Location: [facility];  Service: General;  Laterality: N/A;      Prior to Admission medications   Medication Sig Start Date End Date  Taking? Authorizing Provider  amoxicillin-clavulanate (AUGMENTIN) 875-125 MG tablet Take 1 tablet by mouth 2 (two) times daily.   Yes Historical Provider, MD  atorvastatin (LIPITOR) 10 MG tablet Take 10 mg by mouth at bedtime.    Yes Historical Provider, MD  bisacodyl (DULCOLAX) 5 MG EC tablet Take 5 mg by mouth 2 (two) times daily as needed for mild constipation or moderate constipation.    Yes Historical Provider, MD  calcium-vitamin D (OSCAL WITH D) 500-200 MG-UNIT per tablet Take 1 tablet by mouth 2 (two) times daily.    Yes Historical Provider, MD  Cholecalciferol (VITAMIN D) 2000 UNITS tablet Take 4,000 Units by mouth daily with breakfast.   Yes Historical Provider, MD  docusate sodium (COLACE) 100 MG capsule Take 100 mg by mouth 2 (two) times daily.   Yes Historical Provider, MD  felbamate (FELBATOL) 600 MG tablet Take 1,200 mg by mouth 2 (two) times daily.    Yes Historical Provider, MD  ferrous sulfate 325 (65 FE) MG tablet Take 325 mg by mouth 2 (two) times daily.   Yes Historical Provider, MD  furosemide (LASIX) 20 MG tablet Take 20 mg by mouth every morning.    Yes Historical Provider, MD  HYDROcodone-acetaminophen (NORCO/VICODIN) 5-325 MG per tablet Take 1 tablet by mouth every 6 (six) hours as needed for moderate pain. 08/16/15  Yes Truitt Merle, MD  lactulose, encephalopathy, (CHRONULAC) 10 GM/15ML SOLN Take 10 g by mouth 2 (two) times daily.    Yes Historical Provider, MD  metoprolol tartrate (LOPRESSOR) 25 MG tablet Take 1 tablet (25 mg total) by mouth 2 (two) times daily. 08/31/15  Yes Gerlene Fee, NP  metroNIDAZOLE (METROGEL) 1 % gel Apply 1 application topically daily as needed (reddened areas of face). Apply to reddened areas of face   Yes Historical Provider, MD  mirtazapine (REMERON) 15 MG tablet Take 15 mg by mouth at bedtime.   Yes Historical Provider, MD  potassium chloride (K-DUR) 10 MEQ tablet Take 20 mEq by mouth daily.   Yes Historical Provider, MD  Amino Acids-Protein  Hydrolys (PRO-STAT) LIQD Take 30 mLs by mouth 2 (two) times daily. Patient not taking: Reported on 09/26/2015 08/31/15   Gerlene Fee, NP  enoxaparin (LOVENOX) 150 MG/ML injection Inject 0.91 mLs (135 mg total) into the skin daily. Patient not taking: Reported on 09/26/2015 08/26/15   Hosie Poisson, MD     No Known Allergies  Social History:  reports that she has never smoked. She does not have any smokeless tobacco history on file. She reports that she does not drink alcohol or use illicit drugs.    Family History  Problem Relation Age of Onset  . Family history unknown: Yes       Physical Exam:  GEN:  Pleasant ill Appearing Obese 69 y.o. Caucasian female examined and in no acute distress; cooperative with exam Filed Vitals:   09/26/15 1818 09/26/15 1835 09/26/15 1837 09/26/15 2311  BP:  108/73  100/54  Pulse:  133  139  Temp:  98.2 F (36.8 C)    TempSrc:  Oral    Resp:  22    SpO2: 95% 88% 95% 93%   Blood pressure 100/54, pulse 139, temperature 98.2 F (36.8 C), temperature source Oral, resp. rate 22, SpO2 93 %. PSYCH: She is alert and oriented x 1; does not appear anxious does not appear depressed; affect is normal HEENT: Normocephalic and Atraumatic, Mucous membranes pink; PERRLA; EOM intact; Fundi:  Benign;  No scleral icterus, Nares: Patent, Oropharynx: Clear, Edentulous,    Neck:  FROM, No Cervical Lymphadenopathy nor Thyromegaly or Carotid Bruit; No JVD; Breasts:: Not examined CHEST WALL: No tenderness CHEST: Normal respiration, clear to auscultation bilaterally HEART: Regular rate and rhythm; no murmurs rubs or gallops BACK: No kyphosis or scoliosis; No CVA tenderness ABDOMEN: Absent Bowel Sounds, Obese, Taut and Distended, Diffusely Tender, No Rebound or Guarding; No Masses, No Organomegaly Rectal Exam: Not done EXTREMITIES: No cyanosis, No clubbing 3+ BLE Edema Genitalia: not examined PULSES: 2+ and symmetric SKIN: Normal hydration no rash or ulceration CNS:   Alert and Oriented x 1, No Focal Deficits Vascular: pulses palpable throughout    Labs on Admission:  Basic Metabolic Panel:  Recent Labs Lab 09/26/15 1900  NA 135  K 4.8  CL 105  CO2 21*  GLUCOSE 135*  BUN 17  CREATININE 0.62  CALCIUM 8.7*   Liver Function Tests:  Recent Labs Lab 09/26/15 1900  AST 17  ALT 10*  ALKPHOS 100  BILITOT 0.2*  PROT 6.8  ALBUMIN 2.1*    Recent Labs Lab 09/26/15 1900  LIPASE 18*   No results for input(s): AMMONIA in the last 168 hours. CBC:  Recent Labs Lab 09/26/15 1900  WBC 15.1*  NEUTROABS 14.3*  HGB 10.5*  HCT 33.2*  MCV 84.1  PLT 534*  Cardiac Enzymes: No results for input(s): CKTOTAL, CKMB, CKMBINDEX, TROPONINI in the last 168 hours.  BNP (last 3 results) No results for input(s): BNP in the last 8760 hours.  ProBNP (last 3 results) No results for input(s): PROBNP in the last 8760 hours.  CBG: No results for input(s): GLUCAP in the last 168 hours.  Radiological Exams on Admission: Dg Abd 1 View  09/26/2015   CLINICAL DATA:  Nasogastric tube placement. Abdominal distention and abdominal pain. Initial encounter.  EXAM: ABDOMEN - 1 VIEW  COMPARISON:  Abdominal radiograph performed earlier today at 7:38 p.m.  FINDINGS: The patient's enteric tube is seen overlying the fundus and body of the stomach.  The colon is partially filled with air. The visualized bowel gas pattern is grossly unremarkable. A small left pleural effusion is noted. No free intra-abdominal air is identified, though evaluation for free air is limited on a single supine view.  No acute osseous abnormalities are seen.  IMPRESSION: Enteric tube seen overlying the fundus and body of the stomach.   Electronically Signed   By: Garald Balding M.D.   On: 09/26/2015 22:38   Ct Abdomen Pelvis W Contrast  09/26/2015   CLINICAL DATA:  Peritoneal carcinomatosis. Worsened abdominal distention. Vomiting today.  EXAM: CT ABDOMEN AND PELVIS WITH CONTRAST  TECHNIQUE:  Multidetector CT imaging of the abdomen and pelvis was performed using the standard protocol following bolus administration of intravenous contrast.  CONTRAST:  158mL OMNIPAQUE IOHEXOL 300 MG/ML  SOLN  COMPARISON:  08/12/15  FINDINGS: There is a large volume peritoneal ascites, increased from 08/12/2015. No focal liver lesions are evident but there are irregularities at the liver surface which likely represent peritoneal disease. Extensive peritoneal and omental implants have progressed from 08/12/2015 with frank peritoneal masses visible in many areas, for example in the right pericolic gutter on axial images 50 through 60. The omental nodularity is very extensive and significantly worsened. There is no bowel obstruction. There is no extraluminal air. There are unremarkable appearances of the spleen, pancreas, adrenals and kidneys.  In the included portions of the lower chest, there is a new moderate left pleural effusion with adjacent atelectasis of the left lower lobe.  No significant skeletal lesions are evident. Moderate lumbar degenerative disc disease incidentally noted.  IMPRESSION: 1. Large volume peritoneal ascites, increased. 2. Extensive peritoneal and omental masses, worsened 3. New moderately large left pleural effusion with left lower lobe atelectasis.   Electronically Signed   By: Andreas Newport M.D.   On: 09/26/2015 23:27   Dg Abd Acute W/chest  09/26/2015   CLINICAL DATA:  Abdominal distention.  Colon cancer.  Peritonitis.  EXAM: DG ABDOMEN ACUTE W/ 1V CHEST  COMPARISON:  08/23/2015 chest radiographs. 06/07/2015 abdominal radiographs.  FINDINGS: Low lung volumes. Stable cardiomediastinal silhouette accounting for the low lung volumes and portable technique, with top-normal heart size. No pneumothorax. No right pleural effusion. There is mild blunting of the left costophrenic angle, suggesting a small left pleural effusion. There are hazy and linear left parahilar lung opacities, suggesting  vascular crowding, atelectasis and/or pulmonary edema. Visualized osseous structures appear intact.  There are multiple dilated small-bowel loops with air-fluid levels on the left lateral decubitus views measuring up to 5.8 cm in diameter. There is diffuse gaseous distention of the stool filled colon. No evidence of pneumatosis or pneumoperitoneum. Surgical sutures overlie the deep pelvis. Marked degenerative changes throughout the visualized thoracolumbar spine. Surgical clips are seen in the right upper quadrant of the abdomen.  IMPRESSION:  1. Low lung volumes. Hazy and linear left parahilar lung opacities, likely due to vascular crowding and atelectasis, cannot exclude asymmetric pulmonary edema. 2. Mild blunting of the left costophrenic angle, suggesting a small left pleural effusion. 3. Multiple dilated small bowel loops with air-fluid levels. Diffuse gaseous distention of the stool filled colon. Findings are most suggestive of an adynamic ileus of the small and large bowel, however a distal small bowel obstruction cannot be entirely excluded. No free air.   Electronically Signed   By: Ilona Sorrel M.D.   On: 09/26/2015 20:11           Assessment/Plan:      69 y.o. female with  Principal Problem:   1.     Peritoneal carcinomatosis ([place])   Extension of Colon Ca or Ovarian Ca   Family does not wish to pursue Treatments   Currently on Hospice Care   Pain Control PRN   Active Problems:   2.    Ascites   Defer to Rounding Team whether to speak with Family    in regard to Paracentesis for comfort     3.    SBO (small bowel obstruction) (H[facility]) vs Ileus      NGT placed to LIMS for Decompression although the problem may     be due to ABD Distension due to Ascites and Tumor Burden   Anti-Emetics PRN   IV Protonix BID       4.    Abdominal distension- due to #2     5.    Colon cancer ([place])   Dx in 05/2015     6.    Seizures (HCC)   On Felbamate Rx  On Hold while NPO   IV Keppra  BID   PRN IV Ativan     7.    Essential hypertension   Monitor BPs    IV Hydralazine PRn SBP > 160     8.    Leukocytosis- ?Sepsis   Monitor Trend   IV Zosyn for Intra-ABD Infection     9.    Thrombocytopenia ([place])- Possibly Reactive due to Sepsis or Stress   Monitor Trend      10.   Hypercholesteremia   Holding Atorvastatin while NPO    11.  DVT Prophylaxis    SCDs    Code Status:     DO NOT RESUSCITATE (DNR)      Family Communication:  Sister at Bedside    Disposition Plan:    Inpatient Status        Time spent:  24 Minutes      Theressa Millard [facility] Pager [phone]   If [place] Please Contact the Day Rounding Team MD for [facility]  If 7PM-7AM, Please Contact Night-Floor Coverage  [email or web address] Password [email or web address] 09/26/2015, 11:29 PM     ADDENDUM:   Patient was seen and examined on 09/26/2015

## 2015-09-26 NOTE — ED Notes (Signed)
Note:  She is quite comfortable in appearance at this time--meds deferred.

## 2015-09-26 NOTE — ED Notes (Signed)
Pt from home via [facility]-Per EMS pt is under Hospice Care. EMS was called out today after family noted new onset abd distention with bruising and no bowels sounds per Hospice RN. Pt has hx of colon CA and is MR. Hospice RN gave pt 2 - 5mg /325 Oxy/tylenol and then vomited them up. Pt was given 4 Zofran en route to facility. Pt is a DNR per family and is noted to be the same in chart.

## 2015-09-26 NOTE — ED Provider Notes (Signed)
CSN: [id number]     Arrival date & time 09/26/15  1805 History   First MD Initiated Contact with Patient 09/26/15 1825     Chief Complaint  Patient presents with  . Abdominal Pain     (Consider location/radiation/quality/duration/timing/severity/associated sxs/prior Treatment) HPI   68yF with abdominal pain, distension and vomiting. Onset today and progressively worsening. Hx primarily from multiple family members at bedside. Hx of MR and lives in group home. On hospice. Colon cancer which electing not to treat. Hx of SBO. Called hospice nurse who recommended bringing to ER. Family reports that she seems very uncomfortable. Very distended. Bruising on lower abdomen. Is on lovenox for hx of PE diagnosed 1 month ago. No blood in emesis. Unsure when last BM may have been.   Past Medical History  Diagnosis Date  . Hypercholesteremia   . Constipation   . Osteoporosis   . Seizure   . Edema   . Mental retardation   . SBO (small bowel obstruction) 11/2013, 05/2015    likely due to adhesions  . Colonic mass 05/2015    right, concerning for cancer   Past Surgical History  Procedure Laterality Date  . Cholecystectomy    . Colonoscopy with propofol N/A 06/07/2015    Procedure: COLONOSCOPY WITH PROPOFOL;  Surgeon: [name], MD;  Location: [facility];  Service: Endoscopy;  Laterality: N/A;  . Laparoscopic partial right colectomy N/A 06/08/2015    Procedure: LAPAROSCOPIC PARTIAL COLECTOMY ;WITH RESECTION OF RIGHT TERMINAL ILIUM;  Surgeon: Alphonsa Overall, MD;  Location: [facility];  Service: General;  Laterality: N/A;   Family History  Problem Relation Age of Onset  . Family history unknown: Yes   Social History  Substance Use Topics  . Smoking status: Never Smoker   . Smokeless tobacco: Not on file  . Alcohol Use: No   OB History    No data available     Review of Systems  Level 5 caveat because of confusion.    Allergies  Review of patient's allergies indicates no known  allergies.  Home Medications   Prior to Admission medications   Medication Sig Start Date End Date Taking? Authorizing Provider  Amino Acids-Protein Hydrolys (PRO-STAT) LIQD Take 30 mLs by mouth 2 (two) times daily. 08/31/15   Gerlene Fee, NP  atorvastatin (LIPITOR) 10 MG tablet Take 10 mg by mouth at bedtime.     Historical Provider, MD  bisacodyl (DULCOLAX) 5 MG EC tablet Take 5 mg by mouth 2 (two) times daily as needed for mild constipation or moderate constipation.     Historical Provider, MD  calcium-vitamin D (OSCAL WITH D) 500-200 MG-UNIT per tablet Take 1 tablet by mouth 2 (two) times daily.     Historical Provider, MD  Cholecalciferol (VITAMIN D) 2000 UNITS tablet Take 4,000 Units by mouth daily with breakfast.    Historical Provider, MD  docusate sodium (COLACE) 100 MG capsule Take 100 mg by mouth 2 (two) times daily.    Historical Provider, MD  enoxaparin (LOVENOX) 150 MG/ML injection Inject 0.91 mLs (135 mg total) into the skin daily. 08/26/15   Hosie Poisson, MD  felbamate (FELBATOL) 600 MG tablet Take 1,200 mg by mouth 2 (two) times daily.     Historical Provider, MD  fenofibrate (TRICOR) 145 MG tablet Take 145 mg by mouth every morning.     Historical Provider, MD  ferrous sulfate 325 (65 FE) MG tablet Take 325 mg by mouth 2 (two) times daily.    Historical  Provider, MD  furosemide (LASIX) 20 MG tablet Take 20 mg by mouth every morning.     Historical Provider, MD  HYDROcodone-acetaminophen (NORCO/VICODIN) 5-325 MG per tablet Take 1 tablet by mouth every 6 (six) hours as needed for moderate pain. 08/16/15   Truitt Merle, MD  lactulose, encephalopathy, (CHRONULAC) 10 GM/15ML SOLN Take 10 g by mouth 2 (two) times daily.     Historical Provider, MD  metoprolol tartrate (LOPRESSOR) 25 MG tablet Take 1 tablet (25 mg total) by mouth 2 (two) times daily. 08/31/15   Gerlene Fee, NP  metroNIDAZOLE (METROGEL) 1 % gel Apply 1 application topically daily. Apply to reddened areas of face     Historical Provider, MD  potassium chloride SA (K-DUR,KLOR-CON) 20 MEQ tablet Take 20 mEq by mouth daily.     Historical Provider, MD   BP 108/73 mmHg  Pulse 133  Temp(Src) 98.2 F (36.8 C) (Oral)  Resp 22  SpO2 95% Physical Exam  Constitutional:  Laying in bed awake. Appears uncomfortable. Occasionally moaning.   HENT:  Head: Normocephalic and atraumatic.  Eyes: Conjunctivae are normal. Right eye exhibits no discharge. Left eye exhibits no discharge.  Neck: Neck supple.  Cardiovascular: Normal rate, regular rhythm and normal heart sounds.  Exam reveals no gallop and no friction rub.   No murmur heard. Pulmonary/Chest: Effort normal and breath sounds normal. No respiratory distress.  Abdominal: Soft. She exhibits distension. There is tenderness.  Distension. Diffuse tenderness? Hard to assess by her reaction. No guarding. Ecchymosis to lower abdomen and periumbilically.   Musculoskeletal: She exhibits no edema or tenderness.  Neurological: She is alert.  Skin: Skin is warm and dry.  Nursing note and vitals reviewed.   ED Course  Procedures (including critical care time) Labs Review Labs Reviewed  CBC WITH DIFFERENTIAL/PLATELET - Abnormal; Notable for the following:    WBC 15.1 (*)    Hemoglobin 10.5 (*)    HCT 33.2 (*)    Platelets 534 (*)    Neutro Abs 14.3 (*)    Lymphs Abs 0.4 (*)    All other components within normal limits  COMPREHENSIVE METABOLIC PANEL - Abnormal; Notable for the following:    CO2 21 (*)    Glucose, Bld 135 (*)    Calcium 8.7 (*)    Albumin 2.1 (*)    ALT 10 (*)    Total Bilirubin 0.2 (*)    All other components within normal limits  LIPASE, BLOOD - Abnormal; Notable for the following:    Lipase 18 (*)    All other components within normal limits  LACTIC ACID, PLASMA  URINALYSIS, ROUTINE W REFLEX MICROSCOPIC (NOT AT [facility])  LACTIC ACID, PLASMA    Imaging Review Dg Abd Acute W/chest  09/26/2015   CLINICAL DATA:  Abdominal distention.   Colon cancer.  Peritonitis.  EXAM: DG ABDOMEN ACUTE W/ 1V CHEST  COMPARISON:  08/23/2015 chest radiographs. 06/07/2015 abdominal radiographs.  FINDINGS: Low lung volumes. Stable cardiomediastinal silhouette accounting for the low lung volumes and portable technique, with top-normal heart size. No pneumothorax. No right pleural effusion. There is mild blunting of the left costophrenic angle, suggesting a small left pleural effusion. There are hazy and linear left parahilar lung opacities, suggesting vascular crowding, atelectasis and/or pulmonary edema. Visualized osseous structures appear intact.  There are multiple dilated small-bowel loops with air-fluid levels on the left lateral decubitus views measuring up to 5.8 cm in diameter. There is diffuse gaseous distention of the stool filled colon. No evidence  of pneumatosis or pneumoperitoneum. Surgical sutures overlie the deep pelvis. Marked degenerative changes throughout the visualized thoracolumbar spine. Surgical clips are seen in the right upper quadrant of the abdomen.  IMPRESSION: 1. Low lung volumes. Hazy and linear left parahilar lung opacities, likely due to vascular crowding and atelectasis, cannot exclude asymmetric pulmonary edema. 2. Mild blunting of the left costophrenic angle, suggesting a small left pleural effusion. 3. Multiple dilated small bowel loops with air-fluid levels. Diffuse gaseous distention of the stool filled colon. Findings are most suggestive of an adynamic ileus of the small and large bowel, however a distal small bowel obstruction cannot be entirely excluded. No free air.   Electronically Signed   By: Ilona Sorrel M.D.   On: 09/26/2015 20:11   I have personally reviewed and evaluated these images and lab results as part of my medical decision-making.   EKG Interpretation None      MDM   Final diagnoses:  Encounter for nasogastric (NG) tube placement  Paralytic ileus ([place])    68yF with abdominal distension, pain and  vomiting. Consider bowel obstruction. Hx of the same. Diffuse ecchymosis of abdominal wall. Pt on lovenox for PE. Presumably gets injections in abdomen. From this? Cullen's sign? Some concern for possible perforation or other intraabdominal catastrophe  particularly with colon CA hx. Will obtain plain films initially. Possible CT.    Virgel Manifold, MD 09/29/15 [phone]

## 2015-09-26 NOTE — ED Notes (Signed)
Pt to xray

## 2015-09-26 NOTE — ED Notes (Signed)
Bed: FU07 Expected date:  Expected time:  Means of arrival:  Comments: EMS- Hospice pt

## 2015-09-27 ENCOUNTER — Encounter (HOSPITAL_COMMUNITY): Payer: Self-pay

## 2015-09-27 DIAGNOSIS — E7800 Pure hypercholesterolemia, unspecified: Secondary | ICD-10-CM | POA: Diagnosis present

## 2015-09-27 DIAGNOSIS — Z4659 Encounter for fitting and adjustment of other gastrointestinal appliance and device: Secondary | ICD-10-CM | POA: Insufficient documentation

## 2015-09-27 DIAGNOSIS — R140 Abdominal distension (gaseous): Secondary | ICD-10-CM | POA: Diagnosis present

## 2015-09-27 DIAGNOSIS — C786 Secondary malignant neoplasm of retroperitoneum and peritoneum: Secondary | ICD-10-CM | POA: Diagnosis present

## 2015-09-27 DIAGNOSIS — D72829 Elevated white blood cell count, unspecified: Secondary | ICD-10-CM | POA: Diagnosis present

## 2015-09-27 DIAGNOSIS — D696 Thrombocytopenia, unspecified: Secondary | ICD-10-CM | POA: Diagnosis present

## 2015-09-27 DIAGNOSIS — R188 Other ascites: Secondary | ICD-10-CM

## 2015-09-27 DIAGNOSIS — C801 Malignant (primary) neoplasm, unspecified: Secondary | ICD-10-CM

## 2015-09-27 LAB — URINE MICROSCOPIC-ADD ON

## 2015-09-27 LAB — URINALYSIS, ROUTINE W REFLEX MICROSCOPIC
Bilirubin Urine: NEGATIVE
GLUCOSE, UA: NEGATIVE mg/dL
Hgb urine dipstick: NEGATIVE
Ketones, ur: NEGATIVE mg/dL
LEUKOCYTES UA: NEGATIVE
Nitrite: NEGATIVE
PH: 6 (ref 5.0–8.0)
Protein, ur: 30 mg/dL — AB
Urobilinogen, UA: 0.2 mg/dL (ref 0.0–1.0)

## 2015-09-27 LAB — BASIC METABOLIC PANEL
ANION GAP: 10 (ref 5–15)
BUN: 19 mg/dL (ref 6–20)
CHLORIDE: 104 mmol/L (ref 101–111)
CO2: 21 mmol/L — AB (ref 22–32)
Calcium: 8.5 mg/dL — ABNORMAL LOW (ref 8.9–10.3)
Creatinine, Ser: 0.68 mg/dL (ref 0.44–1.00)
GFR calc Af Amer: >60 mL/min (ref >60)
GLUCOSE: 112 mg/dL — AB (ref 65–99)
POTASSIUM: 4.7 mmol/L (ref 3.5–5.1)
Sodium: 135 mmol/L (ref 135–145)

## 2015-09-27 LAB — CBC
HEMATOCRIT: 32.6 % — AB (ref 36.0–46.0)
HEMOGLOBIN: 9.9 g/dL — AB (ref 12.0–15.0)
MCH: 25.9 pg — AB (ref 26.0–34.0)
MCHC: 30.4 g/dL (ref 30.0–36.0)
MCV: 85.3 fL (ref 78.0–100.0)
PLATELETS: 486 10*3/uL — AB (ref 150–400)
RBC: 3.82 MIL/uL — ABNORMAL LOW (ref 3.87–5.11)
RDW: 15.5 % (ref 11.5–15.5)
WBC: 14.3 10*3/uL — ABNORMAL HIGH (ref 4.0–10.5)

## 2015-09-27 MED ORDER — SODIUM CHLORIDE 0.9 % IV SOLN
500.0000 mg | Freq: Two times a day (BID) | INTRAVENOUS | Status: DC
  Administered 2015-09-27 – 2015-09-28 (×3): 500 mg via INTRAVENOUS
  Filled 2015-09-27 (×4): qty 5

## 2015-09-27 MED ORDER — ONDANSETRON HCL 4 MG/2ML IJ SOLN: 4.0000 mg | Freq: Four times a day (QID) | INTRAMUSCULAR | Status: DC | PRN

## 2015-09-27 MED ORDER — LORAZEPAM 2 MG/ML IJ SOLN
0.5000 mg | Freq: Four times a day (QID) | INTRAMUSCULAR | Status: DC | PRN
  Administered 2015-09-27 – 2015-09-28 (×3): 0.5 mg via INTRAVENOUS
  Filled 2015-09-27 (×3): qty 1

## 2015-09-27 MED ORDER — HYDROMORPHONE HCL 1 MG/ML IJ SOLN: 1.0000 mg | INTRAMUSCULAR | Status: DC | PRN

## 2015-09-27 MED ORDER — OXYCODONE HCL 5 MG PO TABS: 5.0000 mg | ORAL_TABLET | ORAL | Status: DC | PRN

## 2015-09-27 MED ORDER — ONDANSETRON HCL 4 MG PO TABS: 4.0000 mg | ORAL_TABLET | Freq: Four times a day (QID) | ORAL | Status: DC | PRN

## 2015-09-27 MED ORDER — PIPERACILLIN-TAZOBACTAM 3.375 G IVPB
3.3750 g | Freq: Three times a day (TID) | INTRAVENOUS | Status: DC
  Administered 2015-09-27 – 2015-09-28 (×4): 3.375 g via INTRAVENOUS
  Filled 2015-09-27 (×5): qty 50

## 2015-09-27 MED ORDER — HYDRALAZINE HCL 20 MG/ML IJ SOLN: 5.0000 mg | INTRAMUSCULAR | Status: DC | PRN

## 2015-09-27 MED ORDER — LORAZEPAM 2 MG/ML IJ SOLN
0.2500 mg | Freq: Four times a day (QID) | INTRAMUSCULAR | Status: DC | PRN
  Administered 2015-09-27: 0.25 mg via INTRAVENOUS
  Filled 2015-09-27: qty 1

## 2015-09-27 MED ORDER — ALUM & MAG HYDROXIDE-SIMETH 200-200-20 MG/5ML PO SUSP: 30.0000 mL | Freq: Four times a day (QID) | ORAL | Status: DC | PRN

## 2015-09-27 MED ORDER — ONDANSETRON HCL 4 MG/2ML IJ SOLN: 4.0000 mg | Freq: Three times a day (TID) | INTRAMUSCULAR | Status: DC | PRN

## 2015-09-27 MED ORDER — ACETAMINOPHEN 650 MG RE SUPP: 650.0000 mg | Freq: Four times a day (QID) | RECTAL | Status: DC | PRN

## 2015-09-27 MED ORDER — SENNOSIDES-DOCUSATE SODIUM 8.6-50 MG PO TABS
1.0000 | ORAL_TABLET | Freq: Two times a day (BID) | ORAL | Status: DC
  Filled 2015-09-27 (×3): qty 1

## 2015-09-27 MED ORDER — HYDROMORPHONE HCL 1 MG/ML IJ SOLN
0.5000 mg | INTRAMUSCULAR | Status: DC | PRN
  Administered 2015-09-27 – 2015-09-28 (×11): 1 mg via INTRAVENOUS
  Filled 2015-09-27 (×11): qty 1

## 2015-09-27 MED ORDER — OXYCODONE HCL ER 10 MG PO T12A
10.0000 mg | EXTENDED_RELEASE_TABLET | Freq: Two times a day (BID) | ORAL | Status: DC
  Administered 2015-09-27: 10 mg via ORAL
  Filled 2015-09-27 (×2): qty 1

## 2015-09-27 MED ORDER — SODIUM CHLORIDE 0.9 % IV SOLN
INTRAVENOUS | Status: DC
  Administered 2015-09-27: 01:00:00 via INTRAVENOUS

## 2015-09-27 MED ORDER — SODIUM CHLORIDE 0.9 % IV SOLN: INTRAVENOUS | Status: DC

## 2015-09-27 MED ORDER — ACETAMINOPHEN 325 MG PO TABS: 650.0000 mg | ORAL_TABLET | Freq: Four times a day (QID) | ORAL | Status: DC | PRN

## 2015-09-27 NOTE — Progress Notes (Signed)
CSW spoke w [facility] [phone]), who states that patient has guardian and that family wants patient to transfer to residential [facility] at discharge.  CSW spoke w admissions coordinator at [facility] - their admissions coordinator will assess patient at [facility], will contact family to discuss hospice placement.  RN CM spoke w guardian, Lelan Pons, who is bedridden and on hospice care herself.  Per RN CM guardian's plan is to seek placement at hospice home at [place].  Per sister Sanjuan Dame in room, and w patient mouthing "home", plan is for patient to return to Mattie's home w home hospice support.  Family asked to provide guardianship paperwork, will need to clarify patient/family wishes before proceeding w residential hospice placement.    Edwyna Shell, LCSW Clinical Social Worker

## 2015-09-27 NOTE — Care Management Note (Signed)
Case Management Note  Patient Details  Name: AUDRYNA WENDT MRN: [id number] Date of Birth: Apr 21, 1946  Subjective/Objective:  Spoke to sister in rm-Mattie, put Marie(HCPOA on speaker phone) discussed final disposition for patient after I explained what residential hospice was-Marie did say that [facility] is unable to assist w/ pain medicine.Informed Lelan Pons what Mattie wishes were-home w/hospice services.  Mattie did acknowledge but once again said residential hospice-[facility]-she understood that this is where you stay.Nsg/CSW updated.                 Action/Plan:d/c plan residential hospice-[facility].   Expected Discharge Date:   (unknown)               Expected Discharge Plan:  [place]  In-House Referral:  Clinical Social Work  Discharge planning Services  CM Consult  Post Acute Care Choice:    Choice offered to:     DME Arranged:    DME Agency:     HH Arranged:    [place] Agency:     Status of Service:  In process, will continue to follow  Medicare Important Message Given:    Date Medicare IM Given:    Medicare IM give by:    Date Additional Medicare IM Given:    Additional Medicare Important Message give by:     If discussed at [place] of Stay Meetings, dates discussed:    Additional Comments:  Dessa Phi, RN 09/27/2015, 4:39 PM

## 2015-09-27 NOTE — Progress Notes (Signed)
NUTRITION NOTE  RD pulled to pt chart for MST. NGT in place with scant drainage at time of RD visit. RN in room reports that family discussion pending for GOC and decision of possible comfort measures only.   Hospice RN saw pt this AM and pending Palliative care consult. Pt was on hospice PTA. MD note at [phone] today states: CT abd: Large volume peritoneal ascites, increased. Extensive peritoneal and omental masses, worsened . New moderately large left pleural effusion.  No family in the room at time of visit and pt moaning, unable to provide information. RD will continue to monitor for Palliative care not and GOC.    Jarome Matin, RD, LDN Inpatient Clinical Dietitian Pager # [phone] After hours/weekend pager # [phone]

## 2015-09-27 NOTE — Clinical Social Work Note (Addendum)
Clinical Social Work Assessment  Patient Details  Name: [name] MRN: [id number] Date of Birth: 07/11/1946  Date of referral:  09/27/15               Reason for consult:  Family Concerns, End of Life/Hospice                Permission sought to share information with:  Guardian, Family Supports Permission granted to share information::  Yes, Verbal Permission Granted  Name::     Sister, [name]; Guardian [name] (also a sister)  Agency::     Relationship::  Sister   Sport and exercise psychologist Information:     Housing/Transportation Living arrangements for the past 2 months:  [place] (also lived w family) Source of Information:  Patient, Guardian (sister in room) Patient Interpreter Needed:  None Criminal Activity/Legal Involvement Pertinent to Current Situation/Hospitalization:  No - Comment as needed Significant Relationships:  Other Family Members, Siblings Lives with:  Siblings Do you feel safe going back to the place where you live?  Yes Need for family participation in patient care:  Yes (Comment) (patient currently on hospice care, wants to live w sister [name] at discharge)  Care giving concerns:  [facility] was resident of [facility] years, was recently placed at [facility] for 30 days of rehab.  After 30 days, was told she either needed to go to SNF or home w family.  Patient wanted to return home w sister, sister [name] (lives in [place]), willing to provide care at her house.  Pt will not return to group home in [place]. Patient has older sister, [name], as legal guardian.  Older sister is currently bedridden and on hospice care herself.  Family advised to get guardianship paperwork and provide to hospital so decision making process is clarified.  Sister [name] is frustrated that patient has been linked w home hospice agency out of [facility] - says that agency took "too long" to respond to patient's caregiving needs re recent episode of  pain.  Wants patient transferred to care of hospice agency based in [facility] as sister [place] lives in [place].  CSW also had call from [facility] [phone]) - stating that their agency was in process of getting patient placed in residential hospice facility.  Per family and patient, this is not the wish of the patient or family - patient wants to go "home", sister agreeable to provide care.     Social Worker assessment / plan:  Refer to RN CM for help w arranging hospice services at home, family requesting transfer of services to their local hospice provider in [place] pt will live in [place] w sister.    Employment status:  Disabled (Comment on whether or not currently receiving Disability) (mental retardation) Insurance information:  [place]icaid [place], [place] PT Recommendations:  Not assessed at this time Information / Referral to community resources:  Other (Comment Required) (hospice)  Patient/Family's Response to care:  Family frustrated w response time of current home hospice provider, think its because agency is based in another county.  Willing to have patient come home, want pain under control   Patient/Family's Understanding of and Emotional Response to Diagnosis, Current Treatment, and Prognosis:  Pt minimally verbal, appears to say "home", family understands prognosis is grave, want patient home w family.  Emotional Assessment Appearance:  Appears stated age, Disheveled Attitude/Demeanor/Rapport:  Other (has difficulty speaking, monosyllabic, family can understand) Affect (typically  observed):  Calm (verbal expressions of pain) Orientation:  Oriented to Self, Oriented to Place Alcohol / Substance use:  Never Used Psych involvement (Current and /or in the community):  No (Comment)  Discharge Needs  Concerns to be addressed:  Discharge Planning Concerns, Grief and Loss Concerns, Developmental Concerns Readmission within the last 30 days:  No Current  discharge risk:  Terminally ill Barriers to Discharge:  No Barriers Identified   Beverely Pace, LCSW 09/27/2015, 3:42 PM

## 2015-09-27 NOTE — Progress Notes (Signed)
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  CT abd: Large volume peritoneal ascites, increased. Extensive peritoneal and omental masses, worsened . New moderately large left pleural effusion -patient had NG tube in ED. No significant output. We will discontinue NG.   2. Leukocytosis. Of unclear etiology. ? Related to underlying cancer progression. Patient is started on IV atx on admission  3. H/o seizure. Cont keppra  4 . H/o PE. Patient is not on anticoagulation. Unfortunately, she remains at high risk for recurrent PE despite anticoagulation   Prognosis is poor with ongoing progressive cancer with peritoneal mets, pleural effusions and related complications, pulmonary embolism, recurrent infections, sepsis.  -patient is DNR. She was under hospice care. She is started on aggressive care again on this admission. But prognosis remains poor. We will ask palliative care evaluation for home or facility hope care. Will speak to famiyl to deescalate the aggressive treatment   Code Status: DNR Family Communication: d/w patient, RN. No family at the bedside  (indicate person spoken with, relationship, and if by phone, the number) Disposition Plan: pend evaluation. Palliative care    Consultants:  Palliative care   Procedures:  none  Antibiotics:  Zosyn 10/2>> (indicate start date, and stop date if known)  HPI/Subjective: Lethargic   Objective: Filed Vitals:   09/27/15 0800  BP: 118/81  Pulse: 144  Temp: 98.4 F (36.9 C)   Resp: 20    Intake/Output Summary (Last 24 hours) at 09/27/15 0922 Last data filed at 09/27/15 0859  Gross per 24 hour  Intake      0 ml  Output      0 ml  Net      0 ml   Filed Weights   09/27/15 0051  Weight: 89.994 kg (198 lb 6.4 oz)    Exam:   General:  lethrgic   Cardiovascular: s1,s2 tachycardia   Respiratory: BL rales LL  Abdomen: distended. Mild tender  Musculoskeletal: mild edema    Data Reviewed: Basic Metabolic Panel:  Recent Labs Lab 09/26/15 1900 09/27/15 0552  NA 135 135  K 4.8 4.7  CL 105 104  CO2 21* 21*  GLUCOSE 135* 112*  BUN 17 19  CREATININE 0.62 0.68  CALCIUM 8.7* 8.5*   Liver Function Tests:  Recent Labs Lab 09/26/15 1900  AST 17  ALT 10*  ALKPHOS 100  BILITOT 0.2*  PROT 6.8  ALBUMIN 2.1*    Recent Labs Lab 09/26/15 1900  LIPASE 18*   No results for input(s): AMMONIA in the last 168 hours. CBC:  Recent Labs Lab 09/26/15 1900 09/27/15 0552  WBC 15.1* 14.3*  NEUTROABS 14.3*  --   HGB 10.5* 9.9*  HCT 33.2* 32.6*  MCV 84.1 85.3  PLT 534* 486*   Cardiac Enzymes: No results for input(s): CKTOTAL, CKMB, CKMBINDEX, TROPONINI in the last 168 hours. BNP (last 3 results) No results for input(s): BNP in the last 8760 hours.  ProBNP (last 3 results) No results for input(s): PROBNP in the last 8760 hours.  CBG: No results for input(s): GLUCAP in the last 168 hours.  No results found for  this or any previous visit (from the past 240 hour(s)).   Studies: Dg Abd 1 View  09/26/2015   CLINICAL DATA:  Nasogastric tube placement. Abdominal distention and abdominal pain. Initial encounter.  EXAM: ABDOMEN - 1 VIEW  COMPARISON:  Abdominal radiograph performed earlier today at 7:38 p.m.  FINDINGS: The patient's enteric tube is seen overlying the fundus and body of the stomach.  The colon is partially filled with air. The visualized bowel gas pattern is grossly unremarkable. A small left pleural effusion is noted. No free  intra-abdominal air is identified, though evaluation for free air is limited on a single supine view.  No acute osseous abnormalities are seen.  IMPRESSION: Enteric tube seen overlying the fundus and body of the stomach.   Electronically Signed   By: Garald Balding M.D.   On: 09/26/2015 22:38   Ct Abdomen Pelvis W Contrast  09/26/2015   CLINICAL DATA:  Peritoneal carcinomatosis. Worsened abdominal distention. Vomiting today.  EXAM: CT ABDOMEN AND PELVIS WITH CONTRAST  TECHNIQUE: Multidetector CT imaging of the abdomen and pelvis was performed using the standard protocol following bolus administration of intravenous contrast.  CONTRAST:  146mL OMNIPAQUE IOHEXOL 300 MG/ML  SOLN  COMPARISON:  08/12/15  FINDINGS: There is a large volume peritoneal ascites, increased from 08/12/2015. No focal liver lesions are evident but there are irregularities at the liver surface which likely represent peritoneal disease. Extensive peritoneal and omental implants have progressed from 08/12/2015 with frank peritoneal masses visible in many areas, for example in the right pericolic gutter on axial images 50 through 60. The omental nodularity is very extensive and significantly worsened. There is no bowel obstruction. There is no extraluminal air. There are unremarkable appearances of the spleen, pancreas, adrenals and kidneys.  In the included portions of the lower chest, there is a new moderate left pleural effusion with adjacent atelectasis of the left lower lobe.  No significant skeletal lesions are evident. Moderate lumbar degenerative disc disease incidentally noted.  IMPRESSION: 1. Large volume peritoneal ascites, increased. 2. Extensive peritoneal and omental masses, worsened 3. New moderately large left pleural effusion with left lower lobe atelectasis.   Electronically Signed   By: Andreas Newport M.D.   On: 09/26/2015 23:27   Dg Abd Acute W/chest  09/26/2015   CLINICAL DATA:  Abdominal distention.  Colon cancer.   Peritonitis.  EXAM: DG ABDOMEN ACUTE W/ 1V CHEST  COMPARISON:  08/23/2015 chest radiographs. 06/07/2015 abdominal radiographs.  FINDINGS: Low lung volumes. Stable cardiomediastinal silhouette accounting for the low lung volumes and portable technique, with top-normal heart size. No pneumothorax. No right pleural effusion. There is mild blunting of the left costophrenic angle, suggesting a small left pleural effusion. There are hazy and linear left parahilar lung opacities, suggesting vascular crowding, atelectasis and/or pulmonary edema. Visualized osseous structures appear intact.  There are multiple dilated small-bowel loops with air-fluid levels on the left lateral decubitus views measuring up to 5.8 cm in diameter. There is diffuse gaseous distention of the stool filled colon. No evidence of pneumatosis or pneumoperitoneum. Surgical sutures overlie the deep pelvis. Marked degenerative changes throughout the visualized thoracolumbar spine. Surgical clips are seen in the right upper quadrant of the abdomen.  IMPRESSION: 1. Low lung volumes. Hazy and linear left parahilar lung opacities, likely due to vascular crowding and atelectasis, cannot exclude asymmetric pulmonary edema. 2. Mild blunting of the left costophrenic angle, suggesting a small left pleural effusion. 3. Multiple dilated small bowel loops with air-fluid levels. Diffuse gaseous distention  of the stool filled colon. Findings are most suggestive of an adynamic ileus of the small and large bowel, however a distal small bowel obstruction cannot be entirely excluded. No free air.   Electronically Signed   By: Ilona Sorrel M.D.   On: 09/26/2015 20:11    Scheduled Meds: . levETIRAcetam  500 mg Intravenous Q12H  . pantoprazole (PROTONIX) IV  40 mg Intravenous Q12H  . piperacillin-tazobactam (ZOSYN)  IV  3.375 g Intravenous Q8H   Continuous Infusions: . sodium chloride Stopped (09/26/15 2313)  . sodium chloride 75 mL/hr at 09/27/15 0111     Principal Problem:   Peritoneal carcinomatosis ([place]) Active Problems:   SBO (small bowel obstruction) (HCC)   Colon cancer (HCC)   Seizures (HCC)   Essential hypertension   Ileus (HCC)   Ascites   Abdominal distension   Leukocytosis   Thrombocytopenia (H[facility])   Hypercholesteremia   Encounter for nasogastric (NG) tube placement    Time spent: >35 minutes     Kinnie Feil  [facility] Pager [phone]. If 7PM-7AM, please contact night-coverage at [email or web address], password [email or web address] 09/27/2015, 9:22 AM  LOS: 1 day

## 2015-09-27 NOTE — Progress Notes (Signed)
ANTIBIOTIC CONSULT NOTE  Pharmacy Consult for Zosyn Indication: intra-abdominal infection  No Known Allergies  Patient Measurements: Weight: 198 lb 6.4 oz (89.994 kg)  Vital Signs: Temp: 98.4 F (36.9 C) (10/03 0615) Temp Source: Oral (10/03 0615) BP: 119/95 mmHg (10/03 0615) Pulse Rate: 144 (10/03 0615) Intake/Output from previous day:   Intake/Output from this shift:    Labs:  Recent Labs  09/26/15 1900 09/27/15 0552  WBC 15.1* 14.3*  HGB 10.5* 9.9*  PLT 534* 486*  CREATININE 0.62 0.68   Estimated Creatinine Clearance: 75.4 mL/min (by C-G formula based on Cr of 0.68). No results for input(s): VANCOTROUGH, VANCOPEAK, VANCORANDOM, GENTTROUGH, GENTPEAK, GENTRANDOM, TOBRATROUGH, TOBRAPEAK, TOBRARND, AMIKACINPEAK, AMIKACINTROU, AMIKACIN in the last 72 hours.   Microbiology: No results found for this or any previous visit (from the past 720 hour(s)).  Anti-infectives    Start     Dose/Rate Route Frequency Ordered Stop   09/27/15 0800  piperacillin-tazobactam (ZOSYN) IVPB 3.375 g     3.375 g 12.5 mL/hr over 240 Minutes Intravenous Every 8 hours 09/27/15 0714        Assessment: 62 yoF with hx Colon CA under Hospice care is admitted with abdominal distenion/pain/nausea and mild leukocytosis.   She is started on Zosyn for possible intra-abdominal infection. Renal function is at patient's baseline.   10/3 >> Zosyn >>  Goal of Therapy:  Eradicate infection.  Plan:  Zosyn 3.375gm IV Q8h to be infused over 4hrs Monitor renal function and cx data   Biagio Borg 09/27/2015,7:14 AM

## 2015-09-27 NOTE — Progress Notes (Signed)
[name] Room 1513-[facility] RN Visit-Stacie Delice Lesch RN, BSN  This is a related admission to [facility] diagnosis of Colon Cancer.  Patient is a DNR.  Patient was transferred from home to [facility] after patient was evaluated by an on call [facility] RN for severe abdominal pain.  Patient was noted to be minimally responsive to verbal stimuli, had episodes of vomiting when put in the upright position and no bowel sounds heard upon examination.  Patient was sent to the ED for further evaluation and comfort measures related to severe pain per family request.  Patient seen is room resting quietly in bed.  No family present at time of visit.  Patient not responsive to verbal stimuli.  Patient currently receiving IV antibiotics via a PIV.  Patient on 2L [place] with respirations WNL. 14 Fr. NG tube noted to Lt nare.  Patient is NPO. Per chart review, patient received 2 doses of 1 mg Dilaudid and one dose of 0.25 mg Ativan the pst 24 hours.  Current [facility] Medication list and transfer summary placed on chart.  [facility] will continue to follow and anticipate any discharge needs.  Please call with any questions or concerns.  Thank you, Annia Belt RN, [facility] Liaison [phone]

## 2015-09-27 NOTE — Care Management Note (Signed)
Case Management Note  Patient Details  Name: DECHELLE ATTAWAY MRN: [id number] Date of Birth: 1946/09/09  Subjective/Objective: 69 y/o f admitted w/Peritoneal Ca, ileus vs SBO.DNR. Hx:MR.From [facility] services.Guardian also receiving [facility] services. Spoke to [facility] liason-Stacie-[facility]-their csw(Vanessa Royce [phone] & our [place] will coordinate transfer to [facility] if MD in agreement.                   Action/Plan:d/c plan [facility].   Expected Discharge Date:   (unknown)               Expected Discharge Plan:  [facility]  In-House Referral:  Clinical Social Work  Discharge planning Services  CM Consult  Post Acute Care Choice:    Choice offered to:     DME Arranged:    DME Agency:     HH Arranged:    [place] Agency:     Status of Service:  In process, will continue to follow  Medicare Important Message Given:    Date Medicare IM Given:    Medicare IM give by:    Date Additional Medicare IM Given:    Additional Medicare Important Message give by:     If discussed at [place] of Stay Meetings, dates discussed:    Additional Comments:  Dessa Phi, RN 09/27/2015, 2:50 PM

## 2015-09-27 NOTE — Progress Notes (Signed)
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 Prognosis is poor with ongoing progressive cancer with peritoneal mets, pleural effusions and related complications, pulmonary embolism, recurrent infections, sepsis.  -patient is DNR. We will discontinue NG tube. Cont comfort care. Start oxycontine BID, cont prn.  I did not recommend paracentesis due to high risk of re accumulation, risk of infection complications. And poor prognosis  [name] N 2:27 PM

## 2015-09-28 DIAGNOSIS — C189 Malignant neoplasm of colon, unspecified: Secondary | ICD-10-CM

## 2015-09-28 MED ORDER — LORAZEPAM 2 MG/ML IJ SOLN
1.0000 mg | Freq: Once | INTRAMUSCULAR | Status: AC
  Administered 2015-09-28: 1 mg via INTRAVENOUS
  Filled 2015-09-28: qty 1

## 2015-09-28 MED ORDER — MORPHINE SULFATE (CONCENTRATE) 20 MG/ML PO SOLN: 10.0000 mg | ORAL | Status: AC | PRN

## 2015-09-28 MED ORDER — ONDANSETRON HCL 4 MG PO TABS: 4.0000 mg | ORAL_TABLET | Freq: Four times a day (QID) | ORAL | Status: AC | PRN

## 2015-09-28 MED ORDER — OXYCODONE HCL 5 MG PO TABS: 5.0000 mg | ORAL_TABLET | ORAL | Status: AC | PRN

## 2015-09-28 MED ORDER — ACETAMINOPHEN 325 MG PO TABS: 650.0000 mg | ORAL_TABLET | Freq: Four times a day (QID) | ORAL | Status: AC | PRN

## 2015-09-28 MED ORDER — HYDROMORPHONE HCL 1 MG/ML IJ SOLN
1.0000 mg | INTRAMUSCULAR | Status: AC
  Administered 2015-09-28: 1 mg via INTRAVENOUS
  Filled 2015-09-28: qty 1

## 2015-09-28 MED ORDER — OXYCODONE HCL ER 10 MG PO T12A: 10.0000 mg | EXTENDED_RELEASE_TABLET | Freq: Two times a day (BID) | ORAL | Status: AC

## 2015-09-28 MED ORDER — LORAZEPAM 1 MG PO TABS: 1.0000 mg | ORAL_TABLET | Freq: Three times a day (TID) | ORAL | Status: AC

## 2015-09-28 NOTE — Progress Notes (Signed)
Report called to Neoma Laming at [place].

## 2015-09-28 NOTE — Clinical Social Work Placement (Signed)
   CLINICAL SOCIAL WORK PLACEMENT  NOTE  Date:  09/28/2015  Patient Details  Name: [name] MRN: [id number] Date of Birth: 12/10/1946  Clinical Social Work is seeking post-discharge placement for this patient at the  ([place] ) level of care (*CSW will initial, date and re-position this form in  chart as items are completed):  Yes   Patient/family provided with [place] Work Department's list of facilities offering this level of care within the geographic area requested by the patient (or if unable, by the patient's family).  Yes   Patient/family informed of their freedom to choose among providers that offer the needed level of care, that participate in Medicare, Medicaid or managed care program needed by the patient, have an available bed and are willing to accept the patient.      Patient/family informed of [facility]'s ownership interest in [facility] and [facility], as well as of the fact that they are under no obligation to receive care at these facilities.  PASRR submitted to EDS on       PASRR number received on       Existing PASRR number confirmed on       FL2 transmitted to all facilities in geographic area requested by pt/family on       FL2 transmitted to all facilities within larger geographic area on       Patient informed that his/her managed care company has contracts with or will negotiate with certain facilities, including the following:        Yes   Patient/family informed of bed offers received.  Patient chooses bed at  [facility] [place] )     Physician recommends and patient chooses bed at      Patient to be transferred to  [facility] ) on 09/28/15.  Patient to be transferred to facility by [facility]     Patient family notified on 09/28/15 of transfer.  Name of family member notified:  Lelan Pons and [place]       Additional Comment:     _______________________________________________ Raymondo Band, LCSW 09/28/2015, 4:24 PM

## 2015-09-28 NOTE — Clinical Documentation Improvement (Signed)
Hospitalist  Can the diagnosis of intellectual disability be further specified? Thank you   Borderline intellectual functioning (IQ 70-84), including suspected or known cause and/or associated condition(s)                                                                                                             Mild (IQ level 50-55 to approximately 60), including suspected or known cause and/or associated condition(s)   Moderate (IQ level 35-40 to 50-55), including suspected or known cause and/or associated condition(s)                                                                                               Severe (IQ 20-25 to 35-40), including suspected or known cause and/or associated condition(s)    Profound (IQ level below 20-25), including suspected or known cause and/or associated condition(s)   Autism, including suspected or known cause and/or associated condition(s)  Learning disability or disorder  Other  Clinically Undetermined  Document any associated diagnoses/conditions.     Please exercise your independent, professional judgment when responding. A specific answer is not anticipated or expected.   Thank You,  [place] [phone]

## 2015-09-28 NOTE — Discharge Summary (Signed)
Physician Discharge Summary  [name] QJF:[id number] DOB: Mar 04, 1946 DOA: 09/26/2015  PCP: Pcp Not In System  Admit date: 09/26/2015 Discharge date: 09/28/2015  Time spent: >45 minutes  Recommendations for Outpatient Follow-up:  Hospice house   Discharge Diagnoses:  Principal Problem:   Peritoneal carcinomatosis [facility]) Active Problems:   SBO (small bowel obstruction) (HCC)   Colon cancer ([place])   Seizures ([place])   Essential hypertension   Ileus ([place])   Ascites   Abdominal distension   Leukocytosis   Thrombocytopenia ([place])   Hypercholesteremia   Encounter for nasogastric (NG) tube placement   Discharge Condition: poor   Diet recommendation: comfort   Filed Weights   09/27/15 0051  Weight: 89.994 kg (198 lb 6.4 oz)    History of present illness:  69 y/o female with PMH of HTN, BL Pulmonary Embolism (Pt is not on anticogulation), h/o Seizures, Mental Retardation, 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 FTT.    Hospital Course:  1. 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 CT abd: Large volume peritoneal ascites, increased. Extensive peritoneal and omental masses, worsened. New moderately large left pleural effusion -prognosis is very poor. Cont supportive care   2. Leukocytosis. Of unclear etiology. ? Related to underlying cancer progression.  3. H/o seizure. On phenobarb  4 . H/o PE. Patient is not on anticoagulation. Unfortunately, she remains at high risk for recurrent PE despite anticoagulation   Prognosis is very poor with ongoing progressive cancer with peritoneal mets, pleural effusions and related complications, pulmonary embolism, recurrent infections, sepsis. Patient is tachycardic, short term prognosis is likely days  -patient is DNR. She was under hospice care. D/w patient, her famiyl  at length. They would like to go hospice home. We will discontinue unnecessary meds    Procedures:  Ct abdomen  (i.e. Studies not automatically included, echos, thoracentesis, etc; not x-rays)  Consultations:  Palliative hospice care   Discharge Exam: Filed Vitals:   09/28/15 0830  BP: 149/84  Pulse: 154  Temp: 99.9 F (37.7 C)  Resp: 20    General: lethargic  Cardiovascular: s1,s2 tachycardic  Respiratory: BL rales   Discharge Instructions  Discharge Instructions    Diet - low sodium heart healthy    Complete by:  As directed      Increase activity slowly    Complete by:  As directed             Medication List    STOP taking these medications        amoxicillin-clavulanate 875-125 MG tablet  Commonly known as:  AUGMENTIN     atorvastatin 10 MG tablet  Commonly known as:  LIPITOR     calcium-vitamin D 500-200 MG-UNIT tablet  Commonly known as:  OSCAL WITH D     enoxaparin 150 MG/ML injection  Commonly known as:  LOVENOX     ferrous sulfate 325 (65 FE) MG tablet     HYDROcodone-acetaminophen 5-325 MG tablet  Commonly known as:  NORCO/VICODIN     lactulose (encephalopathy) 10 GM/15ML Soln  Commonly known as:  CHRONULAC     metoprolol tartrate 25 MG tablet  Commonly known as:  LOPRESSOR     metroNIDAZOLE 1 % gel  Commonly known as:  METROGEL     mirtazapine 15 MG tablet  Commonly known as:  REMERON     potassium chloride 10 MEQ tablet  Commonly known  as:  K-DUR     PRO-STAT Liqd     Vitamin D 2000 UNITS tablet      TAKE these medications        acetaminophen 325 MG tablet  Commonly known as:  TYLENOL  Take 2 tablets (650 mg total) by mouth every 6 (six) hours as needed for mild pain (or Fever >/= 101).     bisacodyl 5 MG EC tablet  Commonly known as:  DULCOLAX  Take 5 mg by mouth 2 (two) times daily as needed for mild constipation or moderate constipation.     docusate sodium 100 MG capsule  Commonly known as:  COLACE  Take 100  mg by mouth 2 (two) times daily.     felbamate 600 MG tablet  Commonly known as:  FELBATOL  Take 1,200 mg by mouth 2 (two) times daily.     furosemide 20 MG tablet  Commonly known as:  LASIX  Take 20 mg by mouth every morning.     LORazepam 1 MG tablet  Commonly known as:  ATIVAN  Take 1 tablet (1 mg total) by mouth every 8 (eight) hours.     morphine 20 MG/ML concentrated solution  Commonly known as:  ROXANOL  Take 0.5 mLs (10 mg total) by mouth every 3 (three) hours as needed for severe pain.     ondansetron 4 MG tablet  Commonly known as:  ZOFRAN  Take 1 tablet (4 mg total) by mouth every 6 (six) hours as needed for nausea.     oxyCODONE 5 MG immediate release tablet  Commonly known as:  Oxy IR/ROXICODONE  Take 1-2 tablets (5-10 mg total) by mouth every 4 (four) hours as needed for moderate pain.     OxyCODONE 10 mg T12a 12 hr tablet  Commonly known as:  OXYCONTIN  Take 1 tablet (10 mg total) by mouth every 12 (twelve) hours.       No Known Allergies    The results of significant diagnostics from this hospitalization (including imaging, microbiology, ancillary and laboratory) are listed below for reference.    Significant Diagnostic Studies: Dg Abd 1 View  09/26/2015   CLINICAL DATA:  Nasogastric tube placement. Abdominal distention and abdominal pain. Initial encounter.  EXAM: ABDOMEN - 1 VIEW  COMPARISON:  Abdominal radiograph performed earlier today at 7:38 p.m.  FINDINGS: The patient's enteric tube is seen overlying the fundus and body of the stomach.  The colon is partially filled with air. The visualized bowel gas pattern is grossly unremarkable. A small left pleural effusion is noted. No free intra-abdominal air is identified, though evaluation for free air is limited on a single supine view.  No acute osseous abnormalities are seen.  IMPRESSION: Enteric tube seen overlying the fundus and body of the stomach.   Electronically Signed   By: Garald Balding M.D.   On:  09/26/2015 22:38   Ct Abdomen Pelvis W Contrast  09/26/2015   CLINICAL DATA:  Peritoneal carcinomatosis. Worsened abdominal distention. Vomiting today.  EXAM: CT ABDOMEN AND PELVIS WITH CONTRAST  TECHNIQUE: Multidetector CT imaging of the abdomen and pelvis was performed using the standard protocol following bolus administration of intravenous contrast.  CONTRAST:  152mL OMNIPAQUE IOHEXOL 300 MG/ML  SOLN  COMPARISON:  08/12/15  FINDINGS: There is a large volume peritoneal ascites, increased from 08/12/2015. No focal liver lesions are evident but there are irregularities at the liver surface which likely represent peritoneal disease. Extensive peritoneal and omental implants have progressed from 08/12/2015  with frank peritoneal masses visible in many areas, for example in the right pericolic gutter on axial images 50 through 60. The omental nodularity is very extensive and significantly worsened. There is no bowel obstruction. There is no extraluminal air. There are unremarkable appearances of the spleen, pancreas, adrenals and kidneys.  In the included portions of the lower chest, there is a new moderate left pleural effusion with adjacent atelectasis of the left lower lobe.  No significant skeletal lesions are evident. Moderate lumbar degenerative disc disease incidentally noted.  IMPRESSION: 1. Large volume peritoneal ascites, increased. 2. Extensive peritoneal and omental masses, worsened 3. New moderately large left pleural effusion with left lower lobe atelectasis.   Electronically Signed   By: Andreas Newport M.D.   On: 09/26/2015 23:27   Dg Abd Acute W/chest  09/26/2015   CLINICAL DATA:  Abdominal distention.  Colon cancer.  Peritonitis.  EXAM: DG ABDOMEN ACUTE W/ 1V CHEST  COMPARISON:  08/23/2015 chest radiographs. 06/07/2015 abdominal radiographs.  FINDINGS: Low lung volumes. Stable cardiomediastinal silhouette accounting for the low lung volumes and portable technique, with top-normal heart size. No  pneumothorax. No right pleural effusion. There is mild blunting of the left costophrenic angle, suggesting a small left pleural effusion. There are hazy and linear left parahilar lung opacities, suggesting vascular crowding, atelectasis and/or pulmonary edema. Visualized osseous structures appear intact.  There are multiple dilated small-bowel loops with air-fluid levels on the left lateral decubitus views measuring up to 5.8 cm in diameter. There is diffuse gaseous distention of the stool filled colon. No evidence of pneumatosis or pneumoperitoneum. Surgical sutures overlie the deep pelvis. Marked degenerative changes throughout the visualized thoracolumbar spine. Surgical clips are seen in the right upper quadrant of the abdomen.  IMPRESSION: 1. Low lung volumes. Hazy and linear left parahilar lung opacities, likely due to vascular crowding and atelectasis, cannot exclude asymmetric pulmonary edema. 2. Mild blunting of the left costophrenic angle, suggesting a small left pleural effusion. 3. Multiple dilated small bowel loops with air-fluid levels. Diffuse gaseous distention of the stool filled colon. Findings are most suggestive of an adynamic ileus of the small and large bowel, however a distal small bowel obstruction cannot be entirely excluded. No free air.   Electronically Signed   By: Ilona Sorrel M.D.   On: 09/26/2015 20:11    Microbiology: No results found for this or any previous visit (from the past 240 hour(s)).   Labs: Basic Metabolic Panel:  Recent Labs Lab 09/26/15 1900 09/27/15 0552  NA 135 135  K 4.8 4.7  CL 105 104  CO2 21* 21*  GLUCOSE 135* 112*  BUN 17 19  CREATININE 0.62 0.68  CALCIUM 8.7* 8.5*   Liver Function Tests:  Recent Labs Lab 09/26/15 1900  AST 17  ALT 10*  ALKPHOS 100  BILITOT 0.2*  PROT 6.8  ALBUMIN 2.1*    Recent Labs Lab 09/26/15 1900  LIPASE 18*   No results for input(s): AMMONIA in the last 168 hours. CBC:  Recent Labs Lab  09/26/15 1900 09/27/15 0552  WBC 15.1* 14.3*  NEUTROABS 14.3*  --   HGB 10.5* 9.9*  HCT 33.2* 32.6*  MCV 84.1 85.3  PLT 534* 486*   Cardiac Enzymes: No results for input(s): CKTOTAL, CKMB, CKMBINDEX, TROPONINI in the last 168 hours. BNP: BNP (last 3 results) No results for input(s): BNP in the last 8760 hours.  ProBNP (last 3 results) No results for input(s): PROBNP in the last 8760 hours.  CBG: No  results for input(s): GLUCAP in the last 168 hours.     SignedKinnie Feil  [facility] 09/28/2015, 12:53 PM

## 2015-09-28 NOTE — Progress Notes (Signed)
[facility] PROGRESS NOTE  [name] TZG:[id number] DOB: 22-Feb-1946 DOA: 09/26/2015 PCP: Pcp Not In System  Assessment/Plan: 69 y/o female with PMH of HTN, BL Pulmonary Embolism (Pt is not on anticogulation), h/o Seizures, Mental Retardation, 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 FTT.    1. 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  CT abd: Large volume peritoneal ascites, increased. Extensive peritoneal and omental masses, worsened. New moderately large left pleural effusion -prognosis is very poor. Cont supportive care   2. Leukocytosis. Of unclear etiology. ? Related to underlying cancer progression.  3. H/o seizure. On phenobarb  4 . H/o PE. Patient is not on anticoagulation. Unfortunately, she remains at high risk for recurrent PE despite anticoagulation   Prognosis is very poor with ongoing progressive cancer with peritoneal mets, pleural effusions and related complications, pulmonary embolism, recurrent infections, sepsis. Patient is tachycardic, short term prognosis is likely days  -patient is DNR. She was under hospice care. D/w patient, her famiyl at length. They would like to go hospice home. We will discontinue unnecessary meds   Code Status: DNR Family Communication: d/w patient, RN. No family at the bedside  (indicate person spoken with, relationship, and if by phone, the number) Disposition Plan: hospice home when bed is available   Consultants:  Palliative care   Procedures:  none  Antibiotics:  Zosyn 10/2>>10/4 (indicate start date, and stop date if known)  HPI/Subjective: Lethargic   Objective: Filed Vitals:   09/28/15 0830  BP: 149/84  Pulse: 154  Temp: 99.9 F (37.7 C)  Resp: 20    Intake/Output Summary (Last 24 hours) at 09/28/15 1146 Last data filed at 09/28/15  0600  Gross per 24 hour  Intake 1668.33 ml  Output      0 ml  Net 1668.33 ml   Filed Weights   09/27/15 0051  Weight: 89.994 kg (198 lb 6.4 oz)    Exam:   General:  lethrgic   Cardiovascular: s1,s2 tachycardia   Respiratory: BL rales LL  Abdomen: distended. Mild tender  Musculoskeletal: mild edema    Data Reviewed: Basic Metabolic Panel:  Recent Labs Lab 09/26/15 1900 09/27/15 0552  NA 135 135  K 4.8 4.7  CL 105 104  CO2 21* 21*  GLUCOSE 135* 112*  BUN 17 19  CREATININE 0.62 0.68  CALCIUM 8.7* 8.5*   Liver Function Tests:  Recent Labs Lab 09/26/15 1900  AST 17  ALT 10*  ALKPHOS 100  BILITOT 0.2*  PROT 6.8  ALBUMIN 2.1*    Recent Labs Lab 09/26/15 1900  LIPASE 18*   No results for input(s): AMMONIA in the last 168 hours. CBC:  Recent Labs Lab 09/26/15 1900 09/27/15 0552  WBC 15.1* 14.3*  NEUTROABS 14.3*  --   HGB 10.5* 9.9*  HCT 33.2* 32.6*  MCV 84.1 85.3  PLT 534* 486*   Cardiac Enzymes: No results for input(s): CKTOTAL, CKMB, CKMBINDEX, TROPONINI in the last 168 hours. BNP (last 3 results) No results for input(s): BNP in the last 8760 hours.  ProBNP (last 3 results) No results for input(s): PROBNP in the last 8760 hours.  CBG: No results for input(s): GLUCAP in the last 168 hours.  No results found for this or any previous visit (from the past 240 hour(s)).   Studies: Dg Abd 1 View  09/26/2015   CLINICAL DATA:  Nasogastric tube placement. Abdominal distention  and abdominal pain. Initial encounter.  EXAM: ABDOMEN - 1 VIEW  COMPARISON:  Abdominal radiograph performed earlier today at 7:38 p.m.  FINDINGS: The patient's enteric tube is seen overlying the fundus and body of the stomach.  The colon is partially filled with air. The visualized bowel gas pattern is grossly unremarkable. A small left pleural effusion is noted. No free intra-abdominal air is identified, though evaluation for free air is limited on a single supine view.  No  acute osseous abnormalities are seen.  IMPRESSION: Enteric tube seen overlying the fundus and body of the stomach.   Electronically Signed   By: Garald Balding M.D.   On: 09/26/2015 22:38   Ct Abdomen Pelvis W Contrast  09/26/2015   CLINICAL DATA:  Peritoneal carcinomatosis. Worsened abdominal distention. Vomiting today.  EXAM: CT ABDOMEN AND PELVIS WITH CONTRAST  TECHNIQUE: Multidetector CT imaging of the abdomen and pelvis was performed using the standard protocol following bolus administration of intravenous contrast.  CONTRAST:  155mL OMNIPAQUE IOHEXOL 300 MG/ML  SOLN  COMPARISON:  08/12/15  FINDINGS: There is a large volume peritoneal ascites, increased from 08/12/2015. No focal liver lesions are evident but there are irregularities at the liver surface which likely represent peritoneal disease. Extensive peritoneal and omental implants have progressed from 08/12/2015 with frank peritoneal masses visible in many areas, for example in the right pericolic gutter on axial images 50 through 60. The omental nodularity is very extensive and significantly worsened. There is no bowel obstruction. There is no extraluminal air. There are unremarkable appearances of the spleen, pancreas, adrenals and kidneys.  In the included portions of the lower chest, there is a new moderate left pleural effusion with adjacent atelectasis of the left lower lobe.  No significant skeletal lesions are evident. Moderate lumbar degenerative disc disease incidentally noted.  IMPRESSION: 1. Large volume peritoneal ascites, increased. 2. Extensive peritoneal and omental masses, worsened 3. New moderately large left pleural effusion with left lower lobe atelectasis.   Electronically Signed   By: Andreas Newport M.D.   On: 09/26/2015 23:27   Dg Abd Acute W/chest  09/26/2015   CLINICAL DATA:  Abdominal distention.  Colon cancer.  Peritonitis.  EXAM: DG ABDOMEN ACUTE W/ 1V CHEST  COMPARISON:  08/23/2015 chest radiographs. 06/07/2015  abdominal radiographs.  FINDINGS: Low lung volumes. Stable cardiomediastinal silhouette accounting for the low lung volumes and portable technique, with top-normal heart size. No pneumothorax. No right pleural effusion. There is mild blunting of the left costophrenic angle, suggesting a small left pleural effusion. There are hazy and linear left parahilar lung opacities, suggesting vascular crowding, atelectasis and/or pulmonary edema. Visualized osseous structures appear intact.  There are multiple dilated small-bowel loops with air-fluid levels on the left lateral decubitus views measuring up to 5.8 cm in diameter. There is diffuse gaseous distention of the stool filled colon. No evidence of pneumatosis or pneumoperitoneum. Surgical sutures overlie the deep pelvis. Marked degenerative changes throughout the visualized thoracolumbar spine. Surgical clips are seen in the right upper quadrant of the abdomen.  IMPRESSION: 1. Low lung volumes. Hazy and linear left parahilar lung opacities, likely due to vascular crowding and atelectasis, cannot exclude asymmetric pulmonary edema. 2. Mild blunting of the left costophrenic angle, suggesting a small left pleural effusion. 3. Multiple dilated small bowel loops with air-fluid levels. Diffuse gaseous distention of the stool filled colon. Findings are most suggestive of an adynamic ileus of the small and large bowel, however a distal small bowel obstruction cannot be entirely excluded.  No free air.   Electronically Signed   By: Ilona Sorrel M.D.   On: 09/26/2015 20:11    Scheduled Meds: . levETIRAcetam  500 mg Intravenous Q12H  . OxyCODONE  10 mg Oral Q12H  . piperacillin-tazobactam (ZOSYN)  IV  3.375 g Intravenous Q8H  . senna-docusate  1 tablet Oral BID   Continuous Infusions: . sodium chloride 100 mL/hr at 09/27/15 1930    Principal Problem:   Peritoneal carcinomatosis ([place]) Active Problems:   SBO (small bowel obstruction) (HCC)   Colon cancer (HCC)    Seizures (HCC)   Essential hypertension   Ileus (HCC)   Ascites   Abdominal distension   Leukocytosis   Thrombocytopenia (HCC)   Hypercholesteremia   Encounter for nasogastric (NG) tube placement    Time spent: >35 minutes     Kinnie Feil  [facility] Pager [phone]. If 7PM-7AM, please contact night-coverage at [email or web address], password [facility] 09/28/2015, 11:46 AM  LOS: 2 days

## 2015-09-28 NOTE — Progress Notes (Signed)
[name] Room 1513-[facility] RN Visit-Margaret Delice Lesch RN, BSN  This is a related admission to [facility] diagnosis of Colon Cancer. Patient is a DNR.  Patient seen is room resting quietly in bed. Sister, Mattie at bedside. Patient not responsive to verbal stimuli. Patient currently receiving IV antibiotics via a PIV. Patient on 2L [place] with respirations WNL.  Per chart review, patient received 5 doses of 1 mg Dilaudid and 2 doses of 0.25 mg Ativan the past 24 hours.  Sister stated the plan is for patient to be transferred to [facility] inpatient facility today. She stated that her understanding is the patient has days to weeks to live and her pain will be better managed in an inpatient hospice facility.  [facility] SW-Joyce contacted to follow-up with this family request.  Will update [facility] Education officer, museum.  Please call with any questions or concerns.  Thank you, Annia Belt RN, [facility] Liaison [phone]

## 2015-09-28 NOTE — Progress Notes (Signed)
Per Yetta Glassman, patient has been accepted to [facility]. Patient and family has been informed of bed offer. Patient is to be transported to the facility via [facility]. RN has been provided the number to report. Facility is aware of patient's arrival. No further CSW services requested at this time. CSW to sign off. Please consult if any additional CSW services arise.   Lucius Conn, [place] Social Worker [place] [phone]

## 2015-09-30 ENCOUNTER — Encounter: Payer: Self-pay | Admitting: Internal Medicine

## 2015-10-26 DEATH — deceased

## 2016-03-17 NOTE — Progress Notes (Signed)
Patient ID: [name], female   DOB: 1946-08-17, 70 y.o.   MRN: [id number]    DATE: 09/15/16  Location:  [facility]    Place of Service: SNF (31)   Extended Emergency Contact Information Primary Emergency Contact: Suits,Marie Address: [place], [place] Phone: [phone] Relation: Sister Secondary Emergency Contact: Suits,Vance  [place] Mobile Phone: [phone] Relation: Nephew  Advanced Directive information  FULL CODE; MOST FORM ON CHART  Chief Complaint  Patient presents with  . Acute Visit    umbilicus redness    HPI:  70 yo female long term resident with colon ca with peritoneal carcinomatosis, not a chemotx candidate seen today for increased redness at umbilicus note today by nursing. She denies abdominal pain, f/c. She has RLE pain that is stinging. Pt has been refusing care by nursing. She is a poor historian due to mental status. Hx obtained from nursing and chart. Her albumin is 2.8  Past Medical History  Diagnosis Date  . Hypercholesteremia   . Constipation   . Osteoporosis   . Seizure ([place])   . Edema   . Mental retardation   . SBO (small bowel obstruction) ([place]) 11/2013, 05/2015    likely due to adhesions  . Colonic mass 05/2015    right, concerning for cancer    Past Surgical History  Procedure Laterality Date  . Cholecystectomy    . Colonoscopy with propofol N/A 06/07/2015    Procedure: COLONOSCOPY WITH PROPOFOL;  Surgeon: [name], MD;  Location: [facility] ENDOSCOPY;  Service: Endoscopy;  Laterality: N/A;  . Laparoscopic partial right colectomy N/A 06/08/2015    Procedure: LAPAROSCOPIC PARTIAL COLECTOMY ;WITH RESECTION OF RIGHT TERMINAL ILIUM;  Surgeon: Alphonsa Overall, MD;  Location: [facility] ORS;  Service: General;  Laterality: N/A;    Patient Care Team: Pcp Not In System as PCP - General  Social History   Social History  . Marital Status: Single   Spouse Name: N/A  . Number of Children: N/A  . Years of Education: N/A   Occupational History  . Not on file.   Social History Main Topics  . Smoking status: Never Smoker   . Smokeless tobacco: Never Used  . Alcohol Use: No  . Drug Use: No  . Sexual Activity: No   Other Topics Concern  . Not on file   Social History Narrative     reports that she has never smoked. She has never used smokeless tobacco. She reports that she does not drink alcohol or use illicit drugs.  Immunization History  Administered Date(s) Administered  . Tdap 07/19/2011    No Known Allergies  Medications: Patient's Medications  New Prescriptions   No medications on file  Previous Medications   ACETAMINOPHEN (TYLENOL) 325 MG TABLET    Take 2 tablets (650 mg total) by mouth every 6 (six) hours as needed for mild pain (or Fever >/= 101).   BISACODYL (DULCOLAX) 5 MG EC TABLET    Take 5 mg by mouth 2 (two) times daily as needed for mild constipation or moderate constipation.    DOCUSATE SODIUM (COLACE) 100 MG CAPSULE    Take 100 mg by mouth 2 (two) times daily.   FELBAMATE (FELBATOL) 600 MG TABLET    Take 1,200 mg by mouth 2 (two) times daily.    FUROSEMIDE (LASIX) 20 MG TABLET    Take 20 mg  by mouth every morning.    LORAZEPAM (ATIVAN) 1 MG TABLET    Take 1 tablet (1 mg total) by mouth every 8 (eight) hours.   MORPHINE (ROXANOL) 20 MG/ML CONCENTRATED SOLUTION    Take 0.5 mLs (10 mg total) by mouth every 3 (three) hours as needed for severe pain.   ONDANSETRON (ZOFRAN) 4 MG TABLET    Take 1 tablet (4 mg total) by mouth every 6 (six) hours as needed for nausea.   OXYCODONE (OXY IR/ROXICODONE) 5 MG IMMEDIATE RELEASE TABLET    Take 1-2 tablets (5-10 mg total) by mouth every 4 (four) hours as needed for moderate pain.   OXYCODONE (OXYCONTIN) 10 MG T12A 12 HR TABLET    Take 1 tablet (10 mg total) by mouth every 12 (twelve) hours.  Modified Medications   No medications on file  Discontinued Medications   No  medications on file    Review of Systems  Unable to perform ROS: Other  mentally challenged  Filed Vitals:   09/16/15 1141  BP: 126/79  Pulse: 69  Temp: 97.8 F (36.6 C)  Weight: 206 lb (93.441 kg)  SpO2: 96%   Body mass index is 32.26 kg/(m^2).  Physical Exam  Constitutional: She appears well-developed.  Frail appearing in NAD  Cardiovascular:  Spider veins right lateral thigh. (+) 1 pitting LE edema -->abdomen  Abdominal: Bowel sounds are normal. She exhibits distension. She exhibits no mass. There is no tenderness. There is no rebound and no guarding.    Neurological: She is alert.  Skin: Skin is warm and dry. There is erythema.  Psychiatric: She has a normal mood and affect. Her behavior is normal.     Labs reviewed: Admission on 08/23/2015, Discharged on 08/27/2015  Component Date Value Ref Range Status  . WBC 08/23/2015 9.2  4.0 - 10.5 K/uL Final  . RBC 08/23/2015 4.43  3.87 - 5.11 MIL/uL Final  . Hemoglobin 08/23/2015 12.2  12.0 - 15.0 g/dL Final  . HCT 34/69/1691 38.3  36.0 - 46.0 % Final  . MCV 08/23/2015 86.5  78.0 - 100.0 fL Final  . MCH 08/23/2015 27.5  26.0 - 34.0 pg Final  . MCHC 08/23/2015 31.9  30.0 - 36.0 g/dL Final  . RDW 55/78/2697 14.7  11.5 - 15.5 % Final  . Platelets 08/23/2015 483* 150 - 400 K/uL Final  . Neutrophils Relative % 08/23/2015 83* 43 - 77 % Final  . Neutro Abs 08/23/2015 7.6  1.7 - 7.7 K/uL Final  . Lymphocytes Relative 08/23/2015 11* 12 - 46 % Final  . Lymphs Abs 08/23/2015 1.0  0.7 - 4.0 K/uL Final  . Monocytes Relative 08/23/2015 6  3 - 12 % Final  . Monocytes Absolute 08/23/2015 0.6  0.1 - 1.0 K/uL Final  . Eosinophils Relative 08/23/2015 0  0 - 5 % Final  . Eosinophils Absolute 08/23/2015 0.0  0.0 - 0.7 K/uL Final  . Basophils Relative 08/23/2015 0  0 - 1 % Final  . Basophils Absolute 08/23/2015 0.0  0.0 - 0.1 K/uL Final  . Lactic Acid, Venous 08/23/2015 1.67  0.5 - 2.0 mmol/L Final  . Sodium 08/23/2015 135  135 - 145  mmol/L Final  . Potassium 08/23/2015 4.6  3.5 - 5.1 mmol/L Final  . Chloride 08/23/2015 99* 101 - 111 mmol/L Final  . CO2 08/23/2015 27  22 - 32 mmol/L Final  . Glucose, Bld 08/23/2015 115* 65 - 99 mg/dL Final  . BUN 06/88/4824 11  6 - 20  mg/dL Final  . Creatinine, Ser 08/23/2015 0.72  0.44 - 1.00 mg/dL Final  . Calcium 08/23/2015 8.9  8.9 - 10.3 mg/dL Final  . Total Protein 08/23/2015 6.8  6.5 - 8.1 g/dL Final  . Albumin 08/23/2015 2.6* 3.5 - 5.0 g/dL Final  . AST 08/23/2015 20  15 - 41 U/L Final  . ALT 08/23/2015 10* 14 - 54 U/L Final  . Alkaline Phosphatase 08/23/2015 76  38 - 126 U/L Final  . Total Bilirubin 08/23/2015 0.2* 0.3 - 1.2 mg/dL Final  . GFR calc non Af Amer 08/23/2015 >60  >60 mL/min Final  . GFR calc Af Amer 08/23/2015 >60  >60 mL/min Final   Comment: (NOTE) The eGFR has been calculated using the CKD EPI equation. This calculation has not been validated in all clinical situations. eGFR's persistently <60 mL/min signify possible Chronic Kidney Disease.   . Anion gap 08/23/2015 9  5 - 15 Final  . Troponin i, poc 08/23/2015 0.00  0.00 - 0.08 ng/mL Final  . Comment 3 08/23/2015          Final   Comment: Due to the release kinetics of cTnI, a negative result within the first hours of the onset of symptoms does not rule out myocardial infarction with certainty. If myocardial infarction is still suspected, repeat the test at appropriate intervals.   Marland Kitchen aPTT 08/23/2015 34  24 - 37 seconds Final  . Prothrombin Time 08/23/2015 15.1  11.6 - 15.2 seconds Final  . INR 08/23/2015 1.17  0.00 - 1.49 Final  . Troponin I 08/23/2015 <0.03  <0.031 ng/mL Final   Comment:        NO INDICATION OF MYOCARDIAL INJURY.   . Troponin I 08/23/2015 <0.03  <0.031 ng/mL Final   Comment:        NO INDICATION OF MYOCARDIAL INJURY.   . Troponin I 08/24/2015 <0.03  <0.031 ng/mL Final   Comment:        NO INDICATION OF MYOCARDIAL INJURY.   . MRSA by PCR 08/23/2015 NEGATIVE  NEGATIVE Final    Comment:        The GeneXpert MRSA Assay (FDA approved for NASAL specimens only), is one component of a comprehensive MRSA colonization surveillance program. It is not intended to diagnose MRSA infection nor to guide or monitor treatment for MRSA infections.   . Sodium 08/24/2015 135  135 - 145 mmol/L Final  . Potassium 08/24/2015 3.9  3.5 - 5.1 mmol/L Final  . Chloride 08/24/2015 102  101 - 111 mmol/L Final  . CO2 08/24/2015 26  22 - 32 mmol/L Final  . Glucose, Bld 08/24/2015 115* 65 - 99 mg/dL Final  . BUN 08/24/2015 10  6 - 20 mg/dL Final  . Creatinine, Ser 08/24/2015 0.54  0.44 - 1.00 mg/dL Final  . Calcium 08/24/2015 8.3* 8.9 - 10.3 mg/dL Final  . Total Protein 08/24/2015 5.5* 6.5 - 8.1 g/dL Final  . Albumin 08/24/2015 2.1* 3.5 - 5.0 g/dL Final  . AST 08/24/2015 17  15 - 41 U/L Final  . ALT 08/24/2015 9* 14 - 54 U/L Final  . Alkaline Phosphatase 08/24/2015 64  38 - 126 U/L Final  . Total Bilirubin 08/24/2015 0.4  0.3 - 1.2 mg/dL Final  . GFR calc non Af Amer 08/24/2015 >60  >60 mL/min Final  . GFR calc Af Amer 08/24/2015 >60  >60 mL/min Final   Comment: (NOTE) The eGFR has been calculated using the CKD EPI equation. This calculation has not been  validated in all clinical situations. eGFR's persistently <60 mL/min signify possible Chronic Kidney Disease.   . Anion gap 08/24/2015 7  5 - 15 Final  . WBC 08/24/2015 7.6  4.0 - 10.5 K/uL Final  . RBC 08/24/2015 3.88  3.87 - 5.11 MIL/uL Final  . Hemoglobin 08/24/2015 10.4* 12.0 - 15.0 g/dL Final  . HCT 08/24/2015 33.3* 36.0 - 46.0 % Final  . MCV 08/24/2015 85.8  78.0 - 100.0 fL Final  . MCH 08/24/2015 26.8  26.0 - 34.0 pg Final  . MCHC 08/24/2015 31.2  30.0 - 36.0 g/dL Final  . RDW 08/24/2015 14.8  11.5 - 15.5 % Final  . Platelets 08/24/2015 434* 150 - 400 K/uL Final  . Heparin Unfractionated 08/23/2015 0.31  0.30 - 0.70 IU/mL Final   Comment:        IF HEPARIN RESULTS ARE BELOW EXPECTED VALUES, AND PATIENT DOSAGE HAS  BEEN CONFIRMED, SUGGEST FOLLOW UP TESTING OF ANTITHROMBIN III LEVELS.   Marland Kitchen Heparin Unfractionated 08/24/2015 0.36  0.30 - 0.70 IU/mL Final   Comment:        IF HEPARIN RESULTS ARE BELOW EXPECTED VALUES, AND PATIENT DOSAGE HAS BEEN CONFIRMED, SUGGEST FOLLOW UP TESTING OF ANTITHROMBIN III LEVELS.   . WBC 08/25/2015 8.0  4.0 - 10.5 K/uL Final  . RBC 08/25/2015 3.98  3.87 - 5.11 MIL/uL Final  . Hemoglobin 08/25/2015 11.1* 12.0 - 15.0 g/dL Final  . HCT 08/25/2015 34.5* 36.0 - 46.0 % Final  . MCV 08/25/2015 86.7  78.0 - 100.0 fL Final  . MCH 08/25/2015 27.9  26.0 - 34.0 pg Final  . MCHC 08/25/2015 32.2  30.0 - 36.0 g/dL Final  . RDW 08/25/2015 14.9  11.5 - 15.5 % Final  . Platelets 08/25/2015 411* 150 - 400 K/uL Final  . Creatinine, Ser 08/25/2015 0.62  0.44 - 1.00 mg/dL Final  . GFR calc non Af Amer 08/25/2015 >60  >60 mL/min Final  . GFR calc Af Amer 08/25/2015 >60  >60 mL/min Final   Comment: (NOTE) The eGFR has been calculated using the CKD EPI equation. This calculation has not been validated in all clinical situations. eGFR's persistently <60 mL/min signify possible Chronic Kidney Disease.   Admission on 08/12/2015, Discharged on 08/12/2015  Component Date Value Ref Range Status  . WBC 08/12/2015 8.7  4.0 - 10.5 K/uL Final  . RBC 08/12/2015 4.17  3.87 - 5.11 MIL/uL Final  . Hemoglobin 08/12/2015 11.6* 12.0 - 15.0 g/dL Final  . HCT 08/12/2015 36.8  36.0 - 46.0 % Final  . MCV 08/12/2015 88.2  78.0 - 100.0 fL Final  . MCH 08/12/2015 27.8  26.0 - 34.0 pg Final  . MCHC 08/12/2015 31.5  30.0 - 36.0 g/dL Final  . RDW 08/12/2015 14.6  11.5 - 15.5 % Final  . Platelets 08/12/2015 473* 150 - 400 K/uL Final  . Neutrophils Relative % 08/12/2015 80* 43 - 77 % Final  . Neutro Abs 08/12/2015 6.8  1.7 - 7.7 K/uL Final  . Lymphocytes Relative 08/12/2015 13  12 - 46 % Final  . Lymphs Abs 08/12/2015 1.2  0.7 - 4.0 K/uL Final  . Monocytes Relative 08/12/2015 7  3 - 12 % Final  . Monocytes  Absolute 08/12/2015 0.6  0.1 - 1.0 K/uL Final  . Eosinophils Relative 08/12/2015 0  0 - 5 % Final  . Eosinophils Absolute 08/12/2015 0.0  0.0 - 0.7 K/uL Final  . Basophils Relative 08/12/2015 0  0 - 1 % Final  .  Basophils Absolute 08/12/2015 0.0  0.0 - 0.1 K/uL Final  . Sodium 08/12/2015 138  135 - 145 mmol/L Final  . Potassium 08/12/2015 4.1  3.5 - 5.1 mmol/L Final  . Chloride 08/12/2015 101  101 - 111 mmol/L Final  . CO2 08/12/2015 27  22 - 32 mmol/L Final  . Glucose, Bld 08/12/2015 109* 65 - 99 mg/dL Final  . BUN 08/12/2015 13  6 - 20 mg/dL Final  . Creatinine, Ser 08/12/2015 0.71  0.44 - 1.00 mg/dL Final  . Calcium 08/12/2015 9.0  8.9 - 10.3 mg/dL Final  . Total Protein 08/12/2015 7.1  6.5 - 8.1 g/dL Final  . Albumin 08/12/2015 2.8* 3.5 - 5.0 g/dL Final  . AST 08/12/2015 17  15 - 41 U/L Final  . ALT 08/12/2015 10* 14 - 54 U/L Final  . Alkaline Phosphatase 08/12/2015 68  38 - 126 U/L Final  . Total Bilirubin 08/12/2015 0.4  0.3 - 1.2 mg/dL Final  . GFR calc non Af Amer 08/12/2015 >60  >60 mL/min Final  . GFR calc Af Amer 08/12/2015 >60  >60 mL/min Final   Comment: (NOTE) The eGFR has been calculated using the CKD EPI equation. This calculation has not been validated in all clinical situations. eGFR's persistently <60 mL/min signify possible Chronic Kidney Disease.   . Anion gap 08/12/2015 10  5 - 15 Final  . Lipase 08/12/2015 15* 22 - 51 U/L Final  . Color, Urine 08/12/2015 YELLOW  YELLOW Final  . APPearance 08/12/2015 CLOUDY* CLEAR Final  . Specific Gravity, Urine 08/12/2015 1.025  1.005 - 1.030 Final  . pH 08/12/2015 5.5  5.0 - 8.0 Final  . Glucose, UA 08/12/2015 NEGATIVE  NEGATIVE mg/dL Final  . Hgb urine dipstick 08/12/2015 NEGATIVE  NEGATIVE Final  . Bilirubin Urine 08/12/2015 NEGATIVE  NEGATIVE Final  . Ketones, ur 08/12/2015 NEGATIVE  NEGATIVE mg/dL Final  . Protein, ur 08/12/2015 NEGATIVE  NEGATIVE mg/dL Final  . Urobilinogen, UA 08/12/2015 0.2  0.0 - 1.0 mg/dL Final    . Nitrite 08/12/2015 NEGATIVE  NEGATIVE Final  . Leukocytes, UA 08/12/2015 NEGATIVE  NEGATIVE Final   MICROSCOPIC NOT DONE ON URINES WITH NEGATIVE PROTEIN, BLOOD, LEUKOCYTES, NITRITE, OR GLUCOSE <1000 mg/dL.    No results found.   Assessment/Plan  Pt refuses CT scan  Rx Augmentin 875 mg po BID with food x 10 days  floraster BID x 2 weeks  Cont other meds as ordered  will follow  Katrenia Alkins S. Perlie Gold  [facility] [address] [place], [place] [address] [phone] Cell (Monday-Friday 8 AM - 5 PM) [phone] After 5 PM and follow prompts

## 2016-04-04 ENCOUNTER — Encounter: Payer: Self-pay | Admitting: Gastroenterology

## 2017-04-15 IMAGING — CT CT ANGIO CHEST
2 of 7 series · 18 of 36 positions shown · IV contrast (OMNIPAQUE 350)
Comparison: Chest radiograph 08/23/2015 and abdominal CT 08/12/2015

CLINICAL DATA: Tachycardia and shortness of breath. History of
colon cancer and peritoneal carcinomatosis.

EXAM:
CT ANGIOGRAPHY CHEST WITH CONTRAST
TECHNIQUE: Multidetector CT imaging of the chest was performed using the
standard protocol during bolus administration of intravenous
contrast. Multiplanar CT image reconstructions and MIPs were
obtained to evaluate the vascular anatomy.
CONTRAST:  100mL OMNIPAQUE IOHEXOL 350 MG/ML SOLN

[Series 9: thins for pacs · axial · 0.64mm/px · z∈[-206,-16]mm · 17 of 214 slices shown]
[im 12/214  lung]
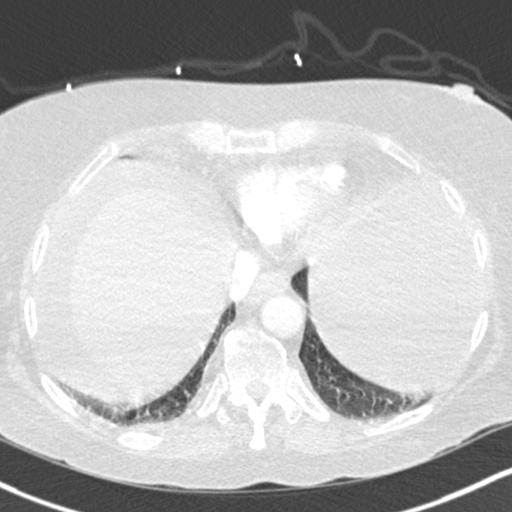
[im 23/214  mediastinal]
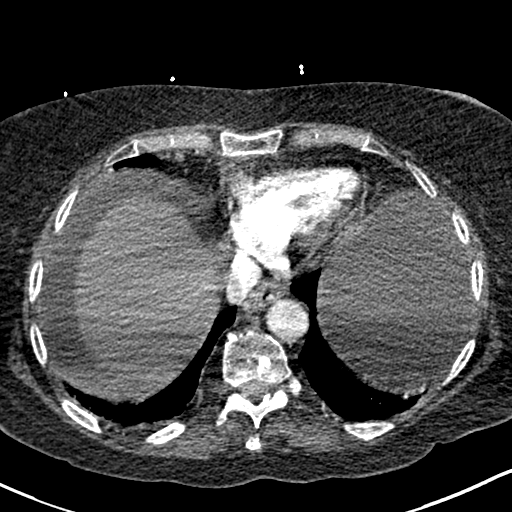
[im 34/214  lung]
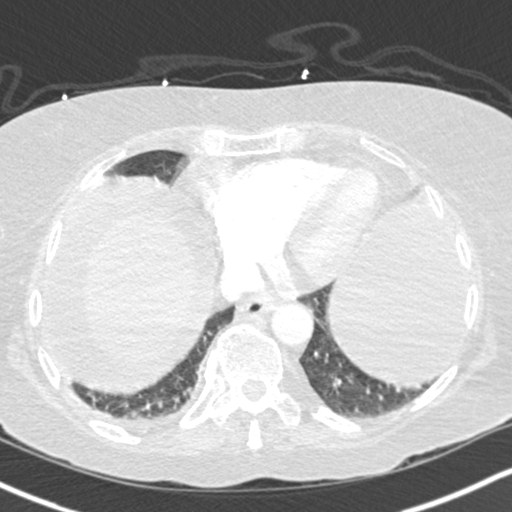
[im 45/214  mediastinal]
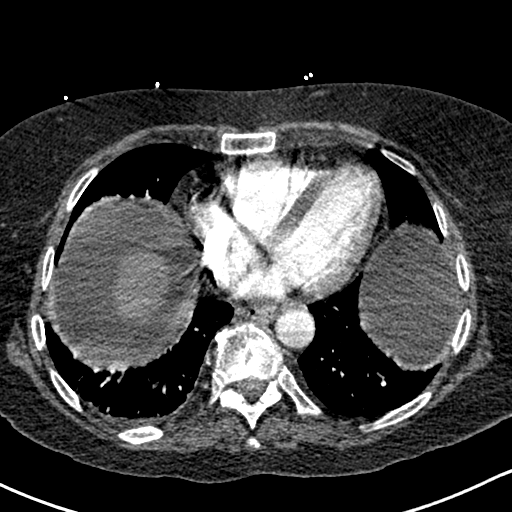
[im 57/214  lung]
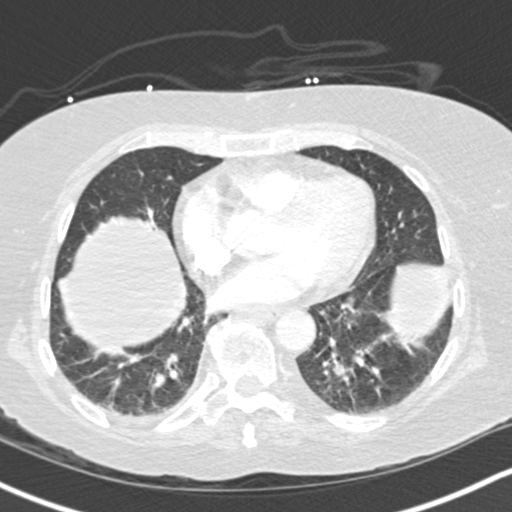
[im 68/214  mediastinal]
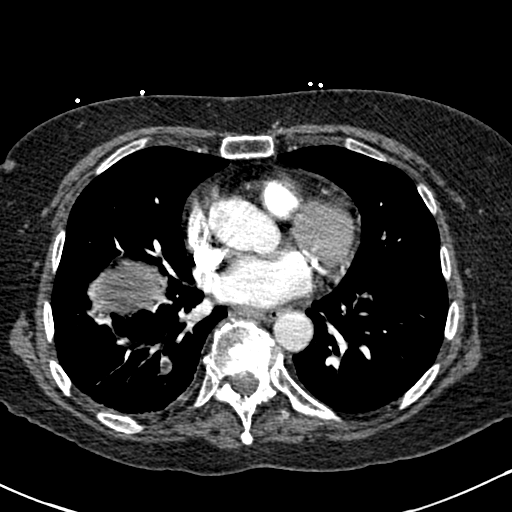
[im 79/214  lung]
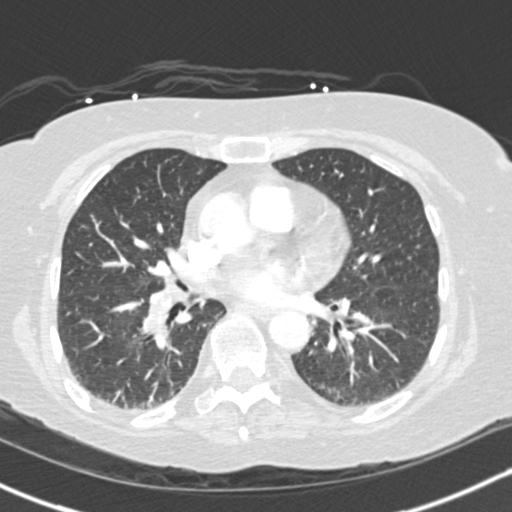
[im 90/214  mediastinal]
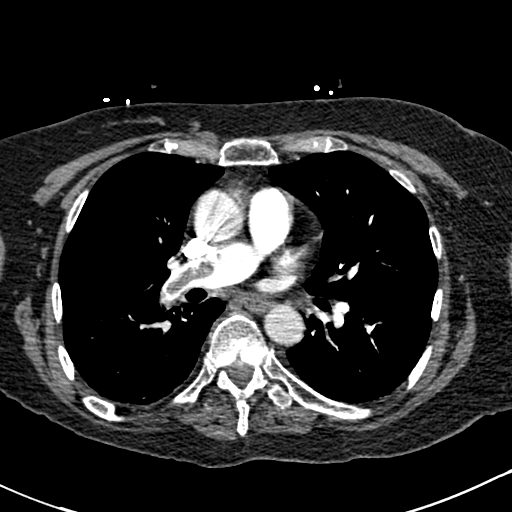
[im 113/214  lung]
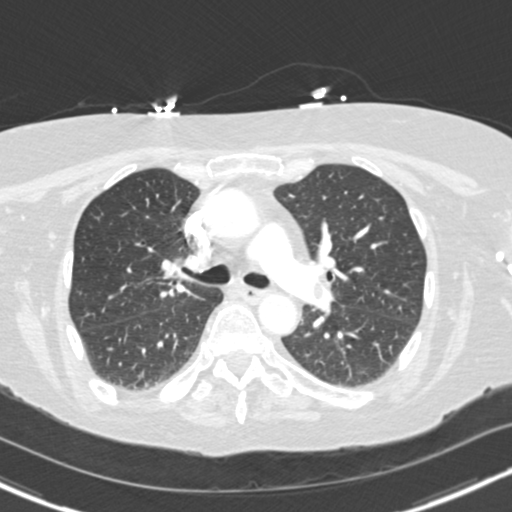
[im 124/214  mediastinal]
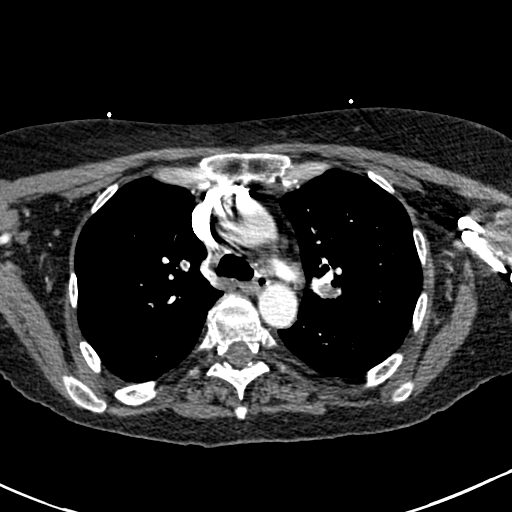
[im 135/214  lung]
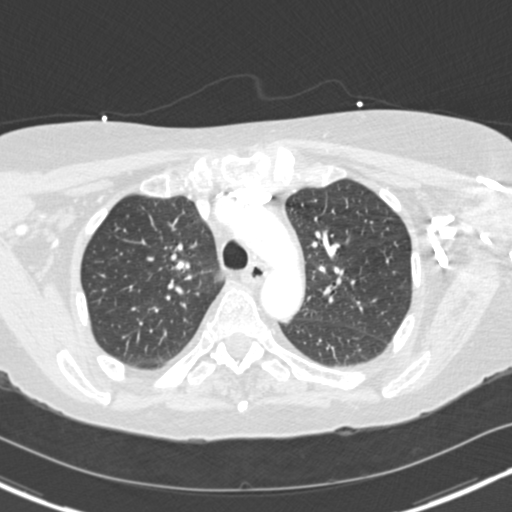
[im 146/214  mediastinal]
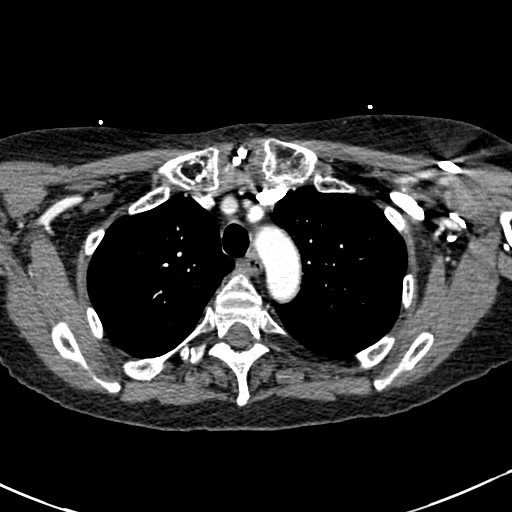
[im 157/214  lung]
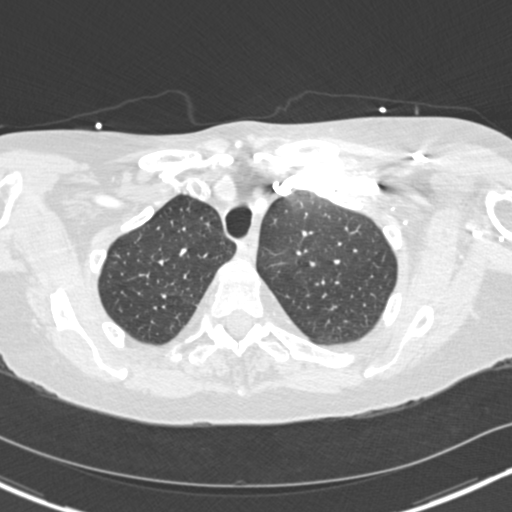
[im 169/214  mediastinal]
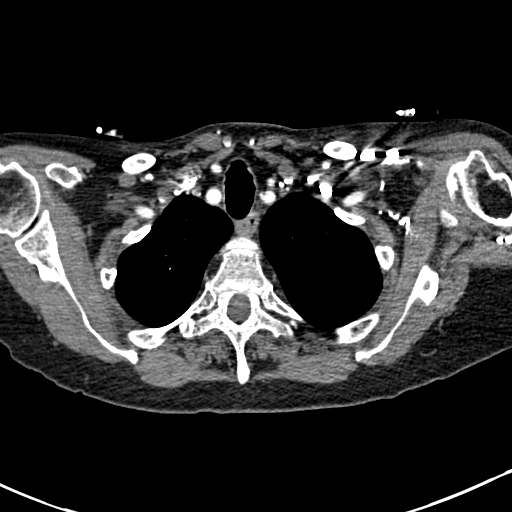
[im 180/214  lung]
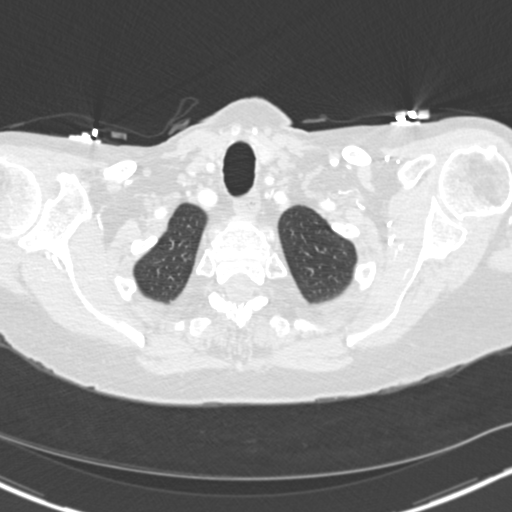
[im 191/214  mediastinal]
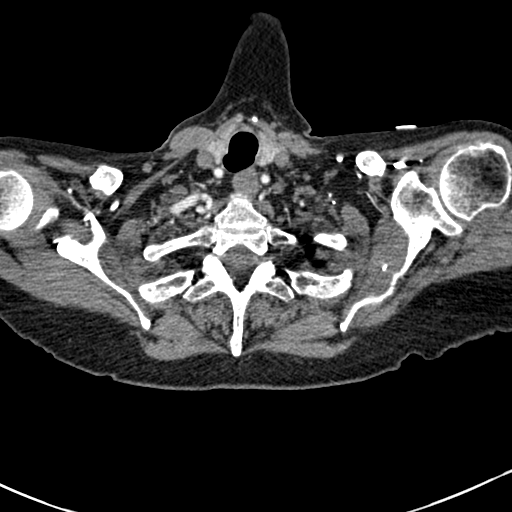
[im 202/214  lung]
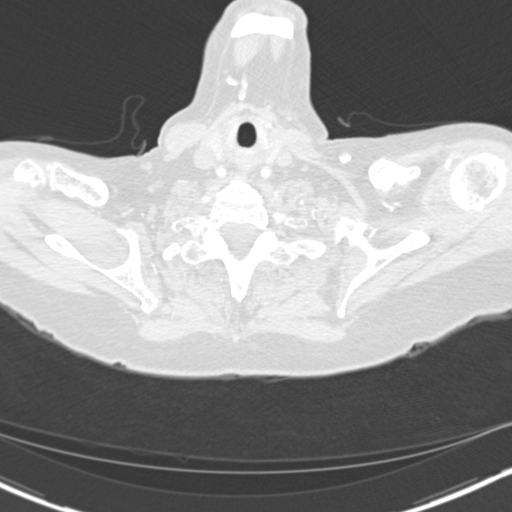

[Series 11: coronal mpr · coronal · 0.42mm/px · 1 of 109 slices shown]
[im 55/109  mediastinal]
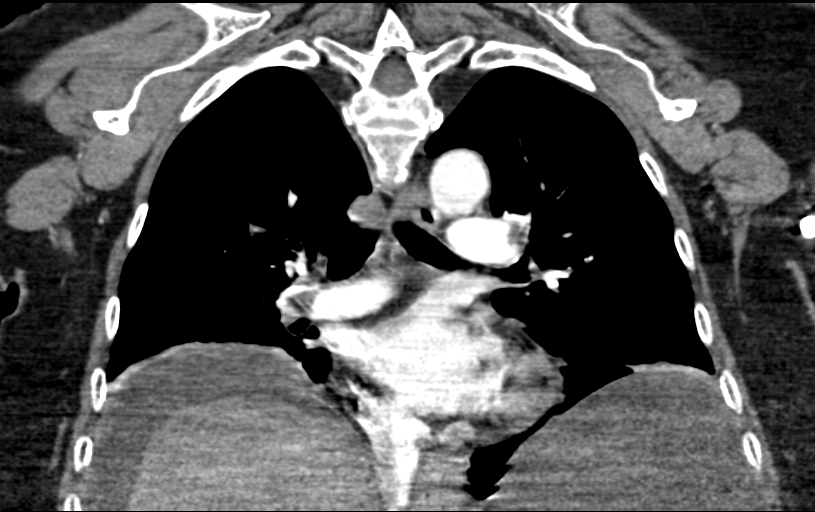

[18 of 36 positions shown; findings below may reference images not displayed]

FINDINGS: Positive for bilateral pulmonary emboli. There is clot in the distal
main right pulmonary artery which extends into the right lower lobe
and right upper lobe pulmonary arteries. Segmental clot in the right
upper lobe. There is segmental and subsegmental clot in the right
lower lobe. Small amount of clot in the left pulmonary arteries.
There is a thrombosed segmental branch in the posterior left upper
lobe. There is thrombus in the left lower lobe pulmonary artery and
additional thrombus in a left lower lobe subsegmental branches. The
RV/LV ratio is approximately 0.9.

There is no significant chest lymphadenopathy. No significant
pericardial or pleural fluid. There is ascites throughout the upper
abdomen. Limited evaluation of the abdominal structures.

Trachea and mainstem bronchi are patent. No significant airspace
disease or consolidation in the lungs. Mild volume loss at the lung
bases.

No acute bone abnormality.

Review of the MIP images confirms the above findings.
IMPRESSION: Positive for bilateral pulmonary emboli, right side greater than
left. CT raises concern for right heart strain (RV/LV Ratio = 0.9)
consistent with at least submassive (intermediate risk) PE.

Abdominal ascites compatible with known peritoneal carcinomatosis.

Critical Value/emergent results were called by telephone at the time
of interpretation on 08/23/2015 at [DATE] to Dr. NICULIN MUNTWILER , who
verbally acknowledged these results.

## 2017-07-20 IMAGING — US US ABDOMEN COMPLETE
1 series · 13 of 25 positions shown · non-contrast
Comparison: CT, 08/12/2015.

CLINICAL DATA: Right upper quadrant pain for 2 days. History of a
cholecystectomy. History of a colonic mass.

EXAM:
ULTRASOUND ABDOMEN COMPLETE

[Series 1: us abdomen complete · 0.30mm/px · 13 of 88 slices shown]
[im 1/88]
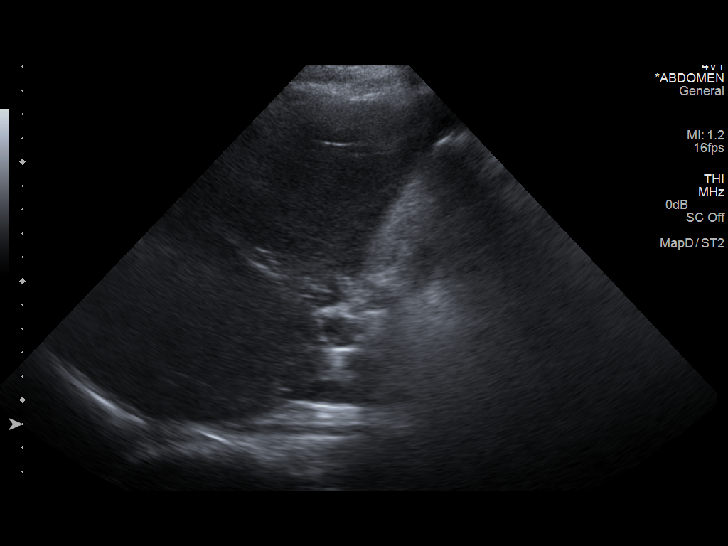
[im 8/88]
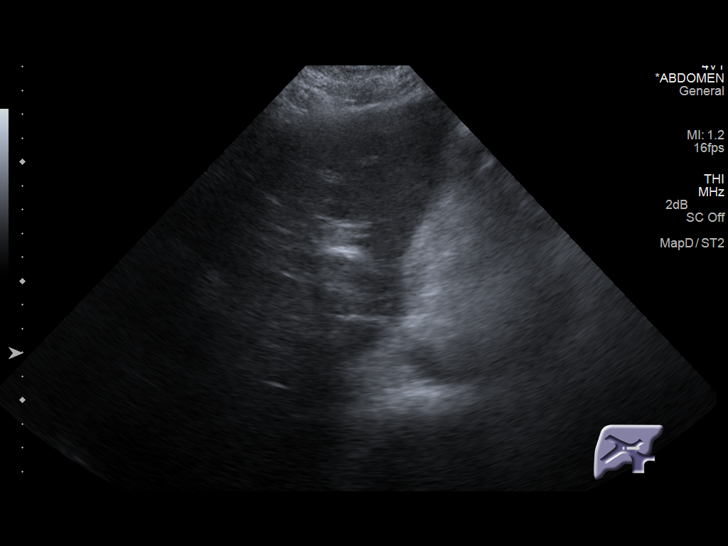
[im 15/88]
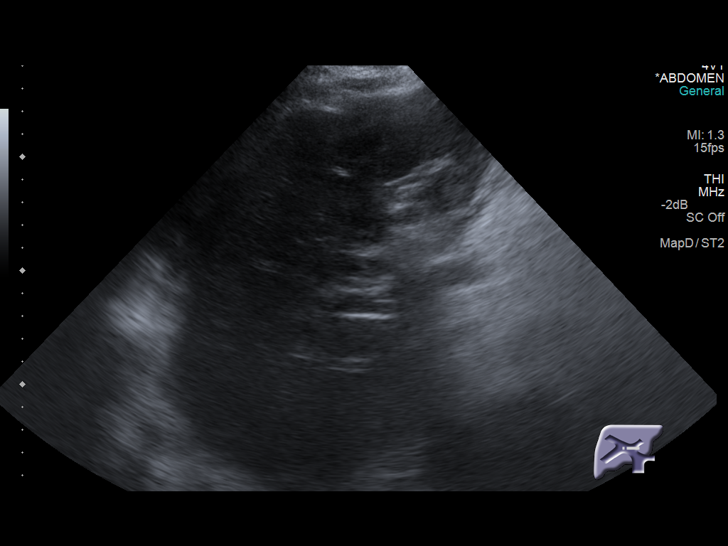
[im 22/88]
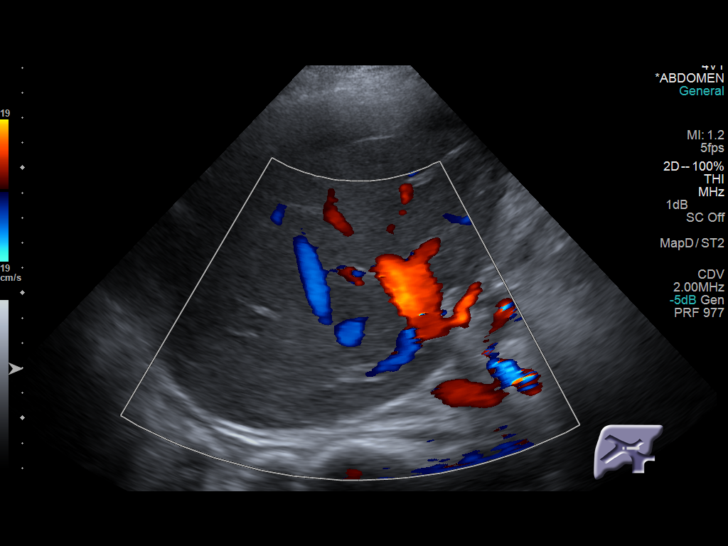
[im 30/88]
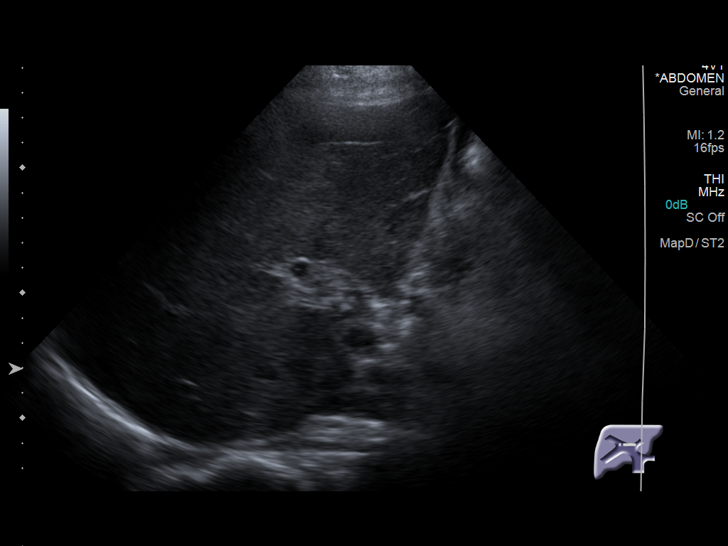
[im 37/88]
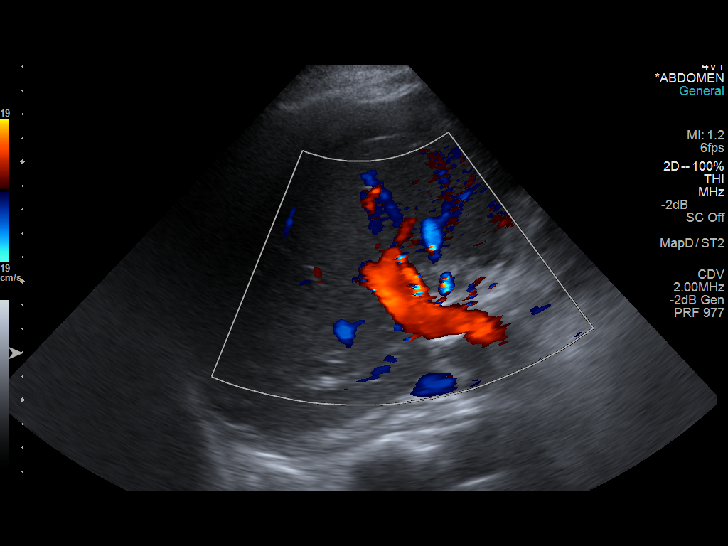
[im 44/88]
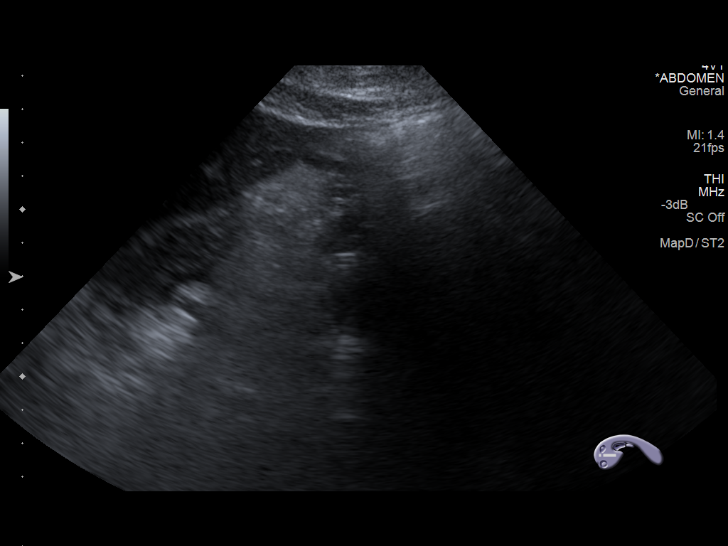
[im 51/88]
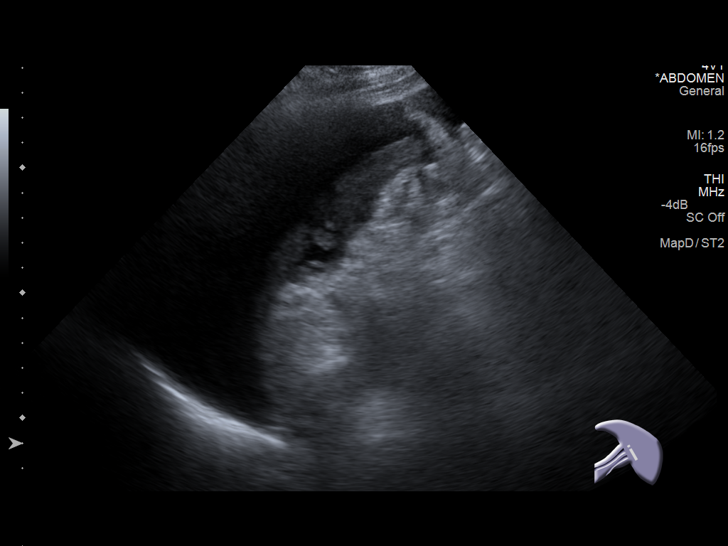
[im 59/88]
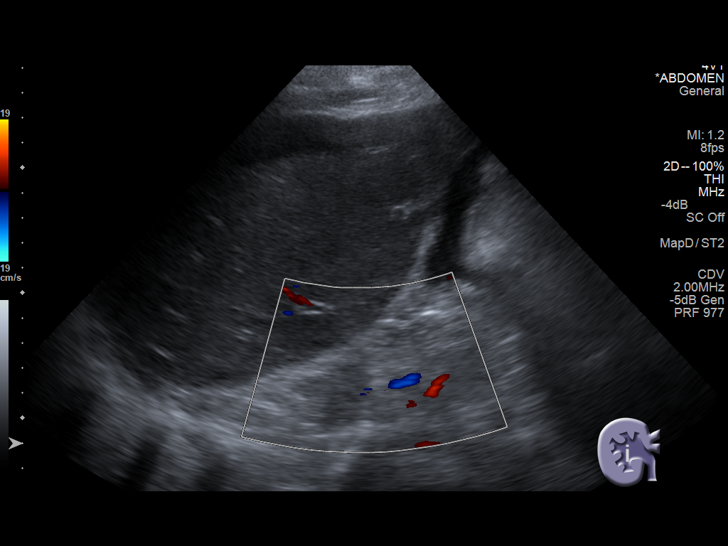
[im 66/88]
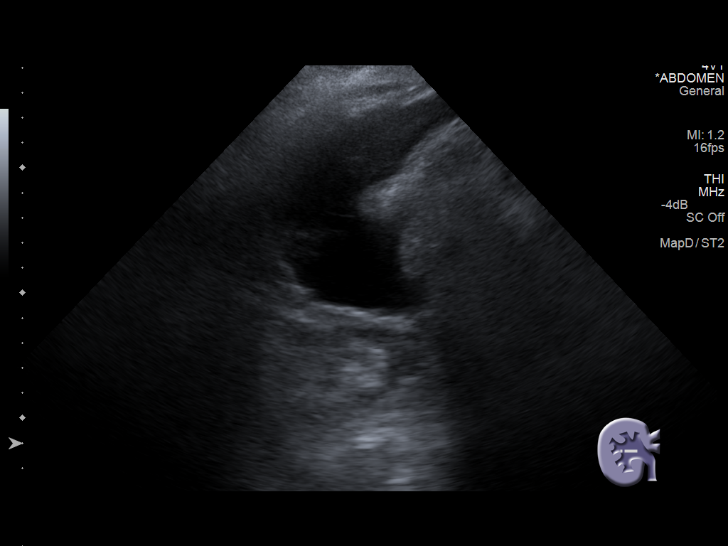
[im 73/88]
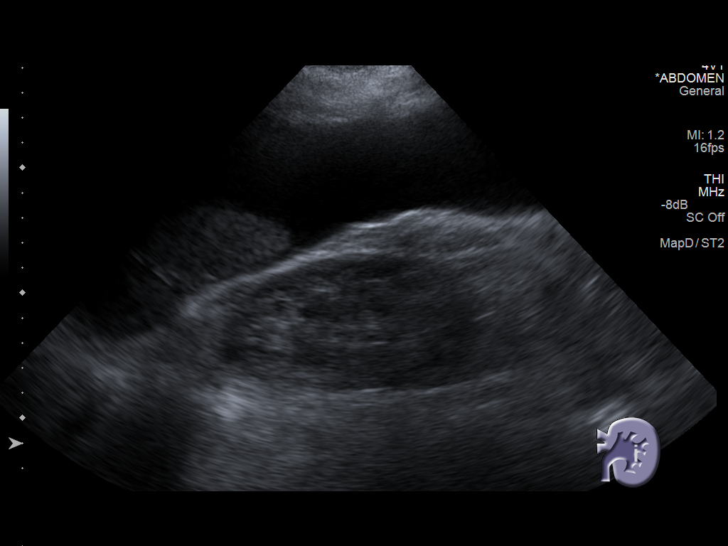
[im 80/88]
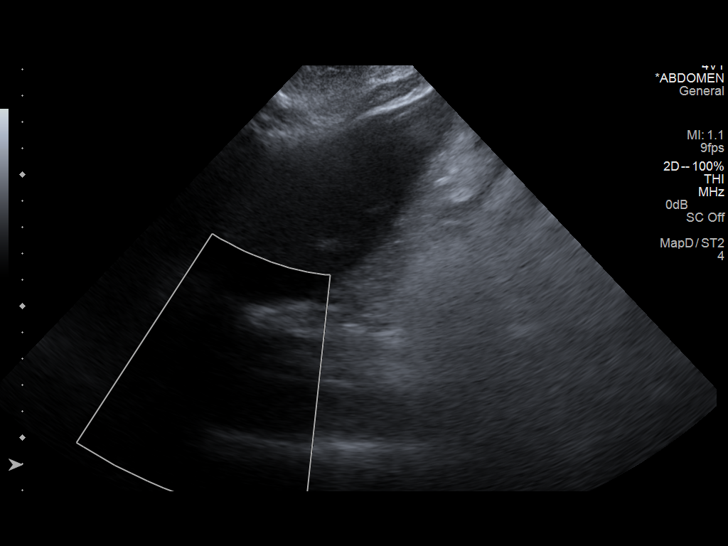
[im 88/88]
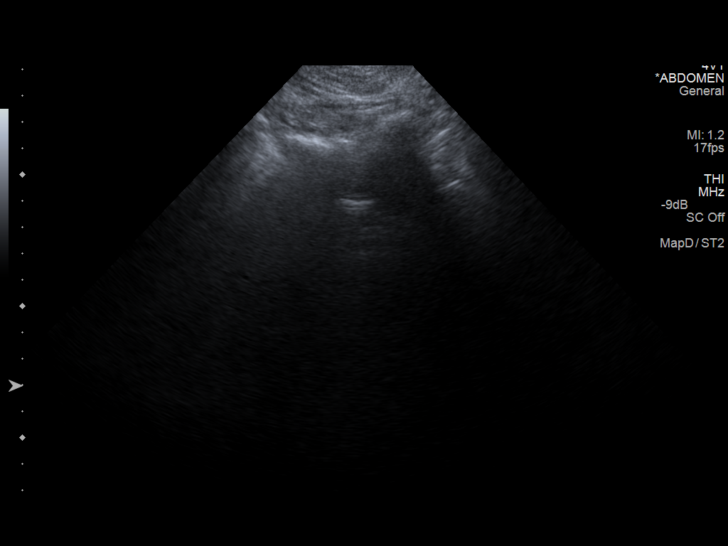

[13 of 25 positions shown; findings below may reference images not displayed]

FINDINGS: Gallbladder: Surgically absent

Common bile duct: Diameter: 10 mm, similar to its size on the prior
CT. No convincing duct stone.

Liver: Coarsened echotexture. No discrete mass or focal lesion.
Hepatopetal flow documented in the portal vein.

IVC: No abnormality visualized.

Pancreas: Not well visualized.

Spleen: Size and appearance within normal limits.

Right Kidney: Length: 10.2 cm. Diffuse cortical thinning. Normal
parenchymal echogenicity. No mass or stone. No hydronephrosis.

Left Kidney: Length: 11.5 cm. Diffuse cortical thinning. Normal
parenchymal echogenicity. No mass or stone. No hydronephrosis.

Abdominal aorta: No aneurysm visualized.

Other findings: Small to moderate amount of ascites surrounds the
liver and spleen.
IMPRESSION: 1. No acute finding.
2. Common bile duct is dilated to 1 cm, but this is chronic
unchanged from prior imaging.
3. Coarsened echotexture of the liver. This may reflect cirrhosis.
No liver mass or focal lesion.
4. Bilateral renal cortical thinning.
5. Ascites similar to that seen on the recent prior CT.
